# Patient Record
Sex: Female | Born: 1970
Health system: Southern US, Community
[De-identification: ages and names within clinical notes are randomized; demographics above are authoritative.]

## PROBLEM LIST (undated history)

## (undated) ENCOUNTER — Inpatient Hospital Stay (HOSPITAL_COMMUNITY): Payer: Self-pay

## (undated) DIAGNOSIS — B009 Herpesviral infection, unspecified: Secondary | ICD-10-CM

## (undated) DIAGNOSIS — Z8489 Family history of other specified conditions: Secondary | ICD-10-CM

## (undated) DIAGNOSIS — T7840XA Allergy, unspecified, initial encounter: Secondary | ICD-10-CM

## (undated) DIAGNOSIS — N750 Cyst of Bartholin's gland: Secondary | ICD-10-CM

## (undated) DIAGNOSIS — I10 Essential (primary) hypertension: Secondary | ICD-10-CM

## (undated) DIAGNOSIS — IMO0001 Reserved for inherently not codable concepts without codable children: Secondary | ICD-10-CM

## (undated) HISTORY — DX: Herpesviral infection, unspecified: B00.9

## (undated) HISTORY — PX: TUBAL LIGATION: SHX77

## (undated) HISTORY — DX: Cyst of Bartholin's gland: N75.0

## (undated) HISTORY — DX: Allergy, unspecified, initial encounter: T78.40XA

## (undated) HISTORY — PX: WISDOM TOOTH EXTRACTION: SHX21

---

## 1999-04-27 ENCOUNTER — Other Ambulatory Visit: Admission: RE | Admit: 1999-04-27 | Discharge: 1999-04-27 | Payer: Self-pay | Admitting: Obstetrics and Gynecology

## 2000-05-21 ENCOUNTER — Other Ambulatory Visit: Admission: RE | Admit: 2000-05-21 | Discharge: 2000-05-21 | Payer: Self-pay | Admitting: Obstetrics and Gynecology

## 2001-05-06 ENCOUNTER — Other Ambulatory Visit: Admission: RE | Admit: 2001-05-06 | Discharge: 2001-05-06 | Payer: Self-pay | Admitting: Obstetrics and Gynecology

## 2002-05-28 ENCOUNTER — Other Ambulatory Visit: Admission: RE | Admit: 2002-05-28 | Discharge: 2002-05-28 | Payer: Self-pay | Admitting: Obstetrics and Gynecology

## 2003-06-25 HISTORY — PX: AUGMENTATION MAMMAPLASTY: SUR837

## 2003-07-13 ENCOUNTER — Other Ambulatory Visit: Admission: RE | Admit: 2003-07-13 | Discharge: 2003-07-13 | Payer: Self-pay | Admitting: Obstetrics and Gynecology

## 2004-07-25 ENCOUNTER — Other Ambulatory Visit: Admission: RE | Admit: 2004-07-25 | Discharge: 2004-07-25 | Payer: Self-pay | Admitting: Obstetrics and Gynecology

## 2005-07-30 ENCOUNTER — Other Ambulatory Visit: Admission: RE | Admit: 2005-07-30 | Discharge: 2005-07-30 | Payer: Self-pay | Admitting: Gynecology

## 2006-05-26 ENCOUNTER — Encounter: Admission: RE | Admit: 2006-05-26 | Discharge: 2006-05-26 | Payer: Self-pay | Admitting: Gynecology

## 2006-09-04 ENCOUNTER — Other Ambulatory Visit: Admission: RE | Admit: 2006-09-04 | Discharge: 2006-09-04 | Payer: Self-pay | Admitting: Gynecology

## 2007-10-01 ENCOUNTER — Other Ambulatory Visit: Admission: RE | Admit: 2007-10-01 | Discharge: 2007-10-01 | Payer: Self-pay | Admitting: Gynecology

## 2007-11-02 ENCOUNTER — Encounter: Admission: RE | Admit: 2007-11-02 | Discharge: 2007-11-02 | Payer: Self-pay | Admitting: Gynecology

## 2008-08-19 ENCOUNTER — Ambulatory Visit: Payer: Self-pay | Admitting: Gynecology

## 2008-08-25 ENCOUNTER — Ambulatory Visit: Payer: Self-pay | Admitting: Gynecology

## 2008-08-25 ENCOUNTER — Ambulatory Visit (HOSPITAL_COMMUNITY): Admission: RE | Admit: 2008-08-25 | Discharge: 2008-08-25 | Payer: Self-pay | Admitting: Gynecology

## 2008-08-26 ENCOUNTER — Ambulatory Visit: Payer: Self-pay | Admitting: Gynecology

## 2008-09-01 ENCOUNTER — Encounter: Admission: RE | Admit: 2008-09-01 | Discharge: 2008-09-01 | Payer: Self-pay | Admitting: Gynecology

## 2008-10-10 ENCOUNTER — Encounter: Payer: Self-pay | Admitting: Gynecology

## 2008-10-10 ENCOUNTER — Other Ambulatory Visit: Admission: RE | Admit: 2008-10-10 | Discharge: 2008-10-10 | Payer: Self-pay | Admitting: Gynecology

## 2008-10-10 ENCOUNTER — Ambulatory Visit: Payer: Self-pay | Admitting: Gynecology

## 2009-03-03 ENCOUNTER — Ambulatory Visit: Payer: Self-pay | Admitting: Gynecology

## 2009-05-01 ENCOUNTER — Encounter: Admission: RE | Admit: 2009-05-01 | Discharge: 2009-05-01 | Payer: Self-pay | Admitting: Gynecology

## 2009-08-14 ENCOUNTER — Ambulatory Visit: Payer: Self-pay | Admitting: Gynecology

## 2009-08-28 ENCOUNTER — Ambulatory Visit: Payer: Self-pay | Admitting: Gynecology

## 2009-12-18 ENCOUNTER — Other Ambulatory Visit: Admission: RE | Admit: 2009-12-18 | Discharge: 2009-12-18 | Payer: Self-pay | Admitting: Gynecology

## 2009-12-18 ENCOUNTER — Ambulatory Visit: Payer: Self-pay | Admitting: Gynecology

## 2010-01-09 ENCOUNTER — Ambulatory Visit: Payer: Self-pay | Admitting: Gynecology

## 2010-02-09 ENCOUNTER — Ambulatory Visit (HOSPITAL_BASED_OUTPATIENT_CLINIC_OR_DEPARTMENT_OTHER): Admission: RE | Admit: 2010-02-09 | Discharge: 2010-02-09 | Payer: Self-pay | Admitting: Otolaryngology

## 2010-02-11 ENCOUNTER — Ambulatory Visit: Payer: Self-pay | Admitting: Internal Medicine

## 2010-05-09 ENCOUNTER — Ambulatory Visit: Payer: Self-pay | Admitting: Gynecology

## 2010-07-15 ENCOUNTER — Encounter: Payer: Self-pay | Admitting: Gynecology

## 2010-09-13 ENCOUNTER — Ambulatory Visit: Payer: Self-pay | Admitting: Gynecology

## 2010-09-14 ENCOUNTER — Ambulatory Visit (INDEPENDENT_AMBULATORY_CARE_PROVIDER_SITE_OTHER): Payer: 59 | Admitting: Gynecology

## 2010-09-14 DIAGNOSIS — R635 Abnormal weight gain: Secondary | ICD-10-CM

## 2010-09-14 DIAGNOSIS — N979 Female infertility, unspecified: Secondary | ICD-10-CM

## 2010-09-14 DIAGNOSIS — N926 Irregular menstruation, unspecified: Secondary | ICD-10-CM

## 2010-09-26 ENCOUNTER — Other Ambulatory Visit (INDEPENDENT_AMBULATORY_CARE_PROVIDER_SITE_OTHER): Payer: 59

## 2010-09-26 DIAGNOSIS — N970 Female infertility associated with anovulation: Secondary | ICD-10-CM

## 2010-10-05 ENCOUNTER — Other Ambulatory Visit: Payer: Self-pay | Admitting: Gynecology

## 2010-10-05 DIAGNOSIS — N979 Female infertility, unspecified: Secondary | ICD-10-CM

## 2010-10-11 ENCOUNTER — Ambulatory Visit (HOSPITAL_COMMUNITY)
Admission: RE | Admit: 2010-10-11 | Discharge: 2010-10-11 | Payer: 59 | Source: Ambulatory Visit | Attending: Gynecology | Admitting: Gynecology

## 2010-11-02 ENCOUNTER — Ambulatory Visit (INDEPENDENT_AMBULATORY_CARE_PROVIDER_SITE_OTHER): Payer: 59 | Admitting: Gynecology

## 2010-11-02 DIAGNOSIS — R823 Hemoglobinuria: Secondary | ICD-10-CM

## 2010-11-02 DIAGNOSIS — N949 Unspecified condition associated with female genital organs and menstrual cycle: Secondary | ICD-10-CM

## 2010-11-08 ENCOUNTER — Ambulatory Visit (HOSPITAL_COMMUNITY)
Admission: RE | Admit: 2010-11-08 | Discharge: 2010-11-08 | Disposition: A | Discharge status: Home / Self Care | Payer: 59 | Source: Ambulatory Visit | Attending: Gynecology | Admitting: Gynecology

## 2010-11-08 DIAGNOSIS — N979 Female infertility, unspecified: Secondary | ICD-10-CM

## 2011-01-04 ENCOUNTER — Other Ambulatory Visit (INDEPENDENT_AMBULATORY_CARE_PROVIDER_SITE_OTHER): Payer: 59

## 2011-01-04 DIAGNOSIS — N912 Amenorrhea, unspecified: Secondary | ICD-10-CM

## 2011-01-10 ENCOUNTER — Ambulatory Visit (INDEPENDENT_AMBULATORY_CARE_PROVIDER_SITE_OTHER): Payer: 59 | Admitting: Gynecology

## 2011-01-10 ENCOUNTER — Other Ambulatory Visit: Payer: 59

## 2011-01-10 DIAGNOSIS — O9989 Other specified diseases and conditions complicating pregnancy, childbirth and the puerperium: Secondary | ICD-10-CM

## 2011-01-10 DIAGNOSIS — N912 Amenorrhea, unspecified: Secondary | ICD-10-CM

## 2011-01-10 DIAGNOSIS — M545 Low back pain, unspecified: Secondary | ICD-10-CM

## 2011-08-07 ENCOUNTER — Inpatient Hospital Stay (HOSPITAL_COMMUNITY)
Admission: AD | Admit: 2011-08-07 | Discharge: 2011-08-07 | Disposition: A | Discharge status: Home / Self Care | Payer: 59 | Source: Ambulatory Visit | Attending: Obstetrics and Gynecology | Admitting: Obstetrics and Gynecology

## 2011-08-07 ENCOUNTER — Encounter (HOSPITAL_COMMUNITY): Payer: Self-pay

## 2011-08-07 DIAGNOSIS — I10 Essential (primary) hypertension: Secondary | ICD-10-CM | POA: Insufficient documentation

## 2011-08-07 DIAGNOSIS — IMO0002 Reserved for concepts with insufficient information to code with codable children: Secondary | ICD-10-CM | POA: Insufficient documentation

## 2011-08-07 HISTORY — DX: Essential (primary) hypertension: I10

## 2011-08-07 LAB — COMPREHENSIVE METABOLIC PANEL
ALT: 16 U/L (ref 0–35)
AST: 17 U/L (ref 0–37)
CO2: 22 mEq/L (ref 19–32)
Chloride: 98 mEq/L (ref 96–112)
Creatinine, Ser: 0.7 mg/dL (ref 0.50–1.10)
Glucose, Bld: 81 mg/dL (ref 70–99)

## 2011-08-07 LAB — CBC
MCHC: 33.3 g/dL (ref 30.0–36.0)
RDW: 12.8 % (ref 11.5–15.5)

## 2011-08-07 LAB — URIC ACID: Uric Acid, Serum: 6.6 mg/dL (ref 2.4–7.0)

## 2011-08-07 NOTE — Progress Notes (Signed)
Patient states she was seen in the office today and sent to MAU for evaluation of elevated BP. Had a high protein level from a 24 hour urine on Monday. Denies any pain or bleeding and reports good fetal movement. Has been having a slight headache.

## 2011-08-07 NOTE — ED Provider Notes (Signed)
History     Chief Complaint  Patient presents with  . Hypertension   HPI 41 yo G1P0 MBF w/ chronic HTN on med now @ 35 5/7 wk w/ mild superimposed preeclampsia based on 24hr UTP 2/10 of 304 mg sent from office due to BP 152/102. Pt denies any other sx. (+) leg swelling  NST reactive in office OB History    Grav Para Term Preterm Abortions TAB SAB Ect Mult Living   1               No past medical history on file. chronic HTN  No past surgical history on file.  No family history on file.  History  Substance Use Topics  . Smoking status: Not on file  . Smokeless tobacco: Not on file  . Alcohol Use: Not on file    Allergies:  Allergies  Allergen Reactions  . Sulfa Drugs Cross Reactors Hives and Swelling    Prescriptions prior to admission  Medication Sig Dispense Refill  . acetaminophen (TYLENOL) 500 MG tablet Take 500 mg by mouth every 6 (six) hours as needed. For pain.      Marland Kitchen FOLIC ACID PO Take 1 tablet by mouth daily.      . methyldopa (ALDOMET) 250 MG tablet Take 250 mg by mouth 2 (two) times daily.      . Prenatal Vit-Fe Fumarate-FA (PRENATAL MULTIVITAMIN) TABS Take 1 tablet by mouth daily.        ROS neg except leg swelling Physical Exam  Abd gravid nontender. Extremity 1-2 plus edema VE: deferred Blood pressure 133/80, pulse 83, temperature 97.9 F (36.6 C), temperature source Oral, resp. rate 20, last menstrual period 11/02/2010, SpO2 100.00%.  Physical Exam  MAU Course  Procedures  MDM   Assessment and Plan  Chronic HTN w/ mild superimposed preeclampsia here for further eval of BP   P) serial BP. CBC, CMP, uric acid. Cont aldomet. Admit if abnl labs  Jannatul Wojdyla A 08/07/2011, 5:21 PM

## 2011-08-23 ENCOUNTER — Inpatient Hospital Stay (HOSPITAL_COMMUNITY)
Admission: AD | Admit: 2011-08-23 | Discharge: 2011-08-25 | DRG: 765 | Disposition: A | Discharge status: Home / Self Care | Payer: 59 | Source: Ambulatory Visit | Attending: Obstetrics and Gynecology | Admitting: Obstetrics and Gynecology

## 2011-08-23 ENCOUNTER — Encounter (HOSPITAL_COMMUNITY): Payer: Self-pay | Admitting: *Deleted

## 2011-08-23 ENCOUNTER — Encounter (HOSPITAL_COMMUNITY)
Admission: AD | Disposition: A | Discharge status: Home / Self Care | Payer: Self-pay | Source: Ambulatory Visit | Attending: Obstetrics and Gynecology

## 2011-08-23 ENCOUNTER — Inpatient Hospital Stay (HOSPITAL_COMMUNITY): Admission: RE | Admit: 2011-08-23 | Payer: 59 | Source: Ambulatory Visit | Admitting: Obstetrics and Gynecology

## 2011-08-23 ENCOUNTER — Inpatient Hospital Stay (HOSPITAL_COMMUNITY): Payer: 59 | Admitting: Anesthesiology

## 2011-08-23 ENCOUNTER — Encounter (HOSPITAL_COMMUNITY): Payer: Self-pay | Admitting: Anesthesiology

## 2011-08-23 ENCOUNTER — Other Ambulatory Visit: Payer: Self-pay | Admitting: Obstetrics and Gynecology

## 2011-08-23 DIAGNOSIS — IMO0001 Reserved for inherently not codable concepts without codable children: Secondary | ICD-10-CM | POA: Diagnosis present

## 2011-08-23 DIAGNOSIS — O9902 Anemia complicating childbirth: Secondary | ICD-10-CM | POA: Diagnosis present

## 2011-08-23 DIAGNOSIS — O1002 Pre-existing essential hypertension complicating childbirth: Secondary | ICD-10-CM | POA: Diagnosis present

## 2011-08-23 DIAGNOSIS — O09519 Supervision of elderly primigravida, unspecified trimester: Secondary | ICD-10-CM | POA: Diagnosis present

## 2011-08-23 DIAGNOSIS — O4100X0 Oligohydramnios, unspecified trimester, not applicable or unspecified: Principal | ICD-10-CM | POA: Diagnosis present

## 2011-08-23 DIAGNOSIS — D509 Iron deficiency anemia, unspecified: Secondary | ICD-10-CM | POA: Diagnosis present

## 2011-08-23 DIAGNOSIS — Z302 Encounter for sterilization: Secondary | ICD-10-CM

## 2011-08-23 DIAGNOSIS — O321XX0 Maternal care for breech presentation, not applicable or unspecified: Secondary | ICD-10-CM | POA: Diagnosis present

## 2011-08-23 DIAGNOSIS — IMO0002 Reserved for concepts with insufficient information to code with codable children: Secondary | ICD-10-CM | POA: Diagnosis present

## 2011-08-23 HISTORY — DX: Reserved for inherently not codable concepts without codable children: IMO0001

## 2011-08-23 LAB — URIC ACID: Uric Acid, Serum: 6.5 mg/dL (ref 2.4–7.0)

## 2011-08-23 LAB — COMPREHENSIVE METABOLIC PANEL
BUN: 11 mg/dL (ref 6–23)
CO2: 23 mEq/L (ref 19–32)
Calcium: 9.4 mg/dL (ref 8.4–10.5)
Creatinine, Ser: 0.64 mg/dL (ref 0.50–1.10)
GFR calc Af Amer: >90 mL/min (ref >90)
GFR calc non Af Amer: >90 mL/min (ref >90)
Glucose, Bld: 82 mg/dL (ref 70–99)
Total Bilirubin: 0.2 mg/dL — ABNORMAL LOW (ref 0.3–1.2)

## 2011-08-23 LAB — CBC
HCT: 32.9 % — ABNORMAL LOW (ref 36.0–46.0)
Hemoglobin: 11.2 g/dL — ABNORMAL LOW (ref 12.0–15.0)
MCH: 31.4 pg (ref 26.0–34.0)
MCV: 92.2 fL (ref 78.0–100.0)
RBC: 3.57 MIL/uL — ABNORMAL LOW (ref 3.87–5.11)

## 2011-08-23 LAB — AMNISURE RUPTURE OF MEMBRANE (ROM) NOT AT ARMC: Amnisure ROM: POSITIVE

## 2011-08-23 SURGERY — Surgical Case
Anesthesia: Spinal | Laterality: Bilateral

## 2011-08-23 SURGERY — Surgical Case
Anesthesia: Regional

## 2011-08-23 MED ORDER — MORPHINE SULFATE (PF) 0.5 MG/ML IJ SOLN
INTRAMUSCULAR | Status: DC | PRN
  Administered 2011-08-23: .1 mg via INTRATHECAL

## 2011-08-23 MED ORDER — FENTANYL CITRATE 0.05 MG/ML IJ SOLN
INTRAMUSCULAR | Status: DC | PRN
  Administered 2011-08-23: 35 ug via INTRAVENOUS
  Administered 2011-08-23: 15 ug via INTRATHECAL
  Administered 2011-08-23: 50 ug via INTRAVENOUS

## 2011-08-23 MED ORDER — LACTATED RINGERS IV SOLN
INTRAVENOUS | Status: DC | PRN
  Administered 2011-08-23 (×2): via INTRAVENOUS

## 2011-08-23 MED ORDER — CITRIC ACID-SODIUM CITRATE 334-500 MG/5ML PO SOLN
30.0000 mL | Freq: Once | ORAL | Status: DC
  Filled 2011-08-23: qty 15

## 2011-08-23 MED ORDER — CEFAZOLIN SODIUM-DEXTROSE 2-3 GM-% IV SOLR
2.0000 g | INTRAVENOUS | Status: AC
  Administered 2011-08-23: 2 g via INTRAVENOUS
  Filled 2011-08-23: qty 50

## 2011-08-23 MED ORDER — BUPIVACAINE IN DEXTROSE 0.75-8.25 % IT SOLN
INTRATHECAL | Status: DC | PRN
  Administered 2011-08-23: 11.75 mg via INTRATHECAL

## 2011-08-23 MED ORDER — ONDANSETRON HCL 4 MG/2ML IJ SOLN
INTRAMUSCULAR | Status: DC | PRN
  Administered 2011-08-23: 4 mg via INTRAVENOUS

## 2011-08-23 MED ORDER — FAMOTIDINE IN NACL 20-0.9 MG/50ML-% IV SOLN
20.0000 mg | Freq: Once | INTRAVENOUS | Status: AC
  Administered 2011-08-23: 20 mg via INTRAVENOUS
  Filled 2011-08-23: qty 50

## 2011-08-23 MED ORDER — OXYTOCIN 20 UNITS IN LACTATED RINGERS INFUSION - SIMPLE
INTRAVENOUS | Status: DC | PRN
  Administered 2011-08-23 (×2): 20 [IU] via INTRAVENOUS

## 2011-08-23 MED ORDER — BUPIVACAINE HCL (PF) 0.25 % IJ SOLN
INTRAMUSCULAR | Status: DC | PRN
  Administered 2011-08-23: 20 mL

## 2011-08-23 SURGICAL SUPPLY — 44 items
APL SKNCLS STERI-STRIP NONHPOA (GAUZE/BANDAGES/DRESSINGS)
BENZOIN TINCTURE PRP APPL 2/3 (GAUZE/BANDAGES/DRESSINGS) IMPLANT
CLOTH BEACON ORANGE TIMEOUT ST (SAFETY) ×2 IMPLANT
CONTAINER PREFILL 10% NBF 15ML (MISCELLANEOUS) ×2 IMPLANT
DRESSING TELFA 8X3 (GAUZE/BANDAGES/DRESSINGS) ×2 IMPLANT
DRSG COVADERM 4X8 (GAUZE/BANDAGES/DRESSINGS) ×1 IMPLANT
DRSG PAD ABDOMINAL 8X10 ST (GAUZE/BANDAGES/DRESSINGS) ×1 IMPLANT
ELECT REM PT RETURN 9FT ADLT (ELECTROSURGICAL) ×2
ELECTRODE REM PT RTRN 9FT ADLT (ELECTROSURGICAL) ×1 IMPLANT
EXTRACTOR VACUUM KIWI (MISCELLANEOUS) IMPLANT
EXTRACTOR VACUUM M CUP 4 TUBE (SUCTIONS) IMPLANT
GAUZE SPONGE 4X4 12PLY STRL LF (GAUZE/BANDAGES/DRESSINGS) ×4 IMPLANT
GLOVE BIO SURGEON STRL SZ 6.5 (GLOVE) ×2 IMPLANT
GLOVE BIOGEL PI IND STRL 7.0 (GLOVE) ×2 IMPLANT
GLOVE BIOGEL PI INDICATOR 7.0 (GLOVE) ×2
GLOVE SURG SS PI 7.5 STRL IVOR (GLOVE) ×2 IMPLANT
GOWN PREVENTION PLUS LG XLONG (DISPOSABLE) ×6 IMPLANT
KIT ABG SYR 3ML LUER SLIP (SYRINGE) IMPLANT
NDL HYPO 25X1 1.5 SAFETY (NEEDLE) ×1 IMPLANT
NDL HYPO 25X5/8 SAFETYGLIDE (NEEDLE) IMPLANT
NEEDLE HYPO 25X1 1.5 SAFETY (NEEDLE) ×2 IMPLANT
NEEDLE HYPO 25X5/8 SAFETYGLIDE (NEEDLE) ×2 IMPLANT
NS IRRIG 1000ML POUR BTL (IV SOLUTION) ×2 IMPLANT
PACK C SECTION WH (CUSTOM PROCEDURE TRAY) ×2 IMPLANT
PAD ABD 7.5X8 STRL (GAUZE/BANDAGES/DRESSINGS) ×2 IMPLANT
RTRCTR C-SECT PINK 25CM LRG (MISCELLANEOUS) ×1 IMPLANT
SLEEVE SCD COMPRESS KNEE MED (MISCELLANEOUS) ×1 IMPLANT
STAPLER VISISTAT 35W (STAPLE) ×1 IMPLANT
STRIP CLOSURE SKIN 1/2X4 (GAUZE/BANDAGES/DRESSINGS) IMPLANT
SUT CHROMIC GUT AB #0 18 (SUTURE) ×1 IMPLANT
SUT MNCRL 0 VIOLET CTX 36 (SUTURE) ×3 IMPLANT
SUT MON AB 4-0 PS1 27 (SUTURE) IMPLANT
SUT MONOCRYL 0 CTX 36 (SUTURE) ×3
SUT PLAIN 2 0 (SUTURE)
SUT PLAIN ABS 2-0 CT1 27XMFL (SUTURE) IMPLANT
SUT VIC AB 0 CT1 27 (SUTURE) ×4
SUT VIC AB 0 CT1 27XBRD ANBCTR (SUTURE) ×2 IMPLANT
SUT VIC AB 2-0 CT1 27 (SUTURE) ×2
SUT VIC AB 2-0 CT1 TAPERPNT 27 (SUTURE) ×1 IMPLANT
SUT VICRYL 0 TIES 12 18 (SUTURE) IMPLANT
SYR CONTROL 10ML LL (SYRINGE) ×2 IMPLANT
TOWEL OR 17X24 6PK STRL BLUE (TOWEL DISPOSABLE) ×4 IMPLANT
TRAY FOLEY CATH 14FR (SET/KITS/TRAYS/PACK) ×1 IMPLANT
WATER STERILE IRR 1000ML POUR (IV SOLUTION) ×2 IMPLANT

## 2011-08-23 NOTE — Anesthesia Procedure Notes (Signed)
Spinal  Patient location during procedure: OR Start time: 08/23/2011 10:30 PM Staffing Performed by: anesthesiologist  Preanesthetic Checklist Completed: patient identified, site marked, surgical consent, pre-op evaluation, timeout performed, IV checked, risks and benefits discussed and monitors and equipment checked Spinal Block Patient position: sitting Prep: site prepped and draped and DuraPrep Patient monitoring: heart rate, cardiac monitor, continuous pulse ox and blood pressure Approach: midline Location: L3-4 Injection technique: single-shot Needle Needle type: Sprotte  Needle gauge: 24 G Needle length: 9 cm Assessment Sensory level: T4 Additional Notes Clear free flow CSF on first attempt, no paresthesia.  Patient tolerated procedure well.  Jasmine December, MD

## 2011-08-23 NOTE — Brief Op Note (Signed)
08/23/2011  11:50 PM  PATIENT:  Jaclyn Fields  41 y.o. female  PRE-OPERATIVE DIAGNOSIS:  Complete breech,oligohydramnios, desires sterilization, SROM  POST-OPERATIVE DIAGNOSIS: complete breech presentation, oligohydramnios, SROM,, desires sterilization  PROCEDURE:  Procedure(s) (LRB):Primary  CESAREAN SECTION, KERR HYSTEROTOMY, BILATERAL MODIFIED POMEROY TUBAL LIGATION (Bilateral)  SURGEON:  Surgeon(s) and Role:    * Mason Burleigh Cathie Beams, MD - Primary  PHYSICIAN ASSISTANT:   ASSISTANTS: none  FINDINGS: LIVE FEMALE COMPLETE BREECH, NL TUBES AND OVARIES, PLACENTA MANUALLY REMOVED  CORD AROUND HAND AND TRUNK ANESTHESIA:   spinal  EBL:  Total I/O In: 1500 [I.V.:1500] Out: 900 [Urine:200; Blood:700]  BLOOD ADMINISTERED:none  DRAINS: none   LOCAL MEDICATIONS USED:  MARCAINE     SPECIMEN:  Source of Specimen:  portion of right and left tube  DISPOSITION OF SPECIMEN:  PATHOLOGY  COUNTS:  YES  TOURNIQUET:  * No tourniquets in log *  DICTATION: .Other Dictation: Dictation Number [id number]  PLAN OF CARE: Admit to inpatient   PATIENT DISPOSITION:  PACU - hemodynamically stable.   Delay start of Pharmacological VTE agent (>24hrs) due to surgical blood loss or risk of bleeding: no

## 2011-08-23 NOTE — Transfer of Care (Signed)
Immediate Anesthesia Transfer of Care Note  Patient: Jaclyn Fields  Procedure(s) Performed: Procedure(s) (LRB): CESAREAN SECTION WITH BILATERAL TUBAL LIGATION (Bilateral)  Patient Location: PACU  Anesthesia Type: Spinal  Level of Consciousness: awake, alert , oriented and patient cooperative  Airway & Oxygen Therapy: Patient Spontanous Breathing  Post-op Assessment: Report given to PACU RN  Post vital signs: Reviewed  Complications: No apparent anesthesia complications

## 2011-08-23 NOTE — Preoperative (Signed)
Beta Blockers   Reason not to administer Beta Blockers:Not Applicable 

## 2011-08-23 NOTE — Anesthesia Preprocedure Evaluation (Signed)
Anesthesia Evaluation  Patient identified by MRN, date of birth, ID band Patient awake    Reviewed: Allergy & Precautions, H&P , NPO status , Patient's Chart, lab work & pertinent test results, reviewed documented beta blocker date and time   History of Anesthesia Complications (+) PONV  Airway Mallampati: II TM Distance: >3 FB Neck ROM: full    Dental  (+) Teeth Intact   Pulmonary neg pulmonary ROS,  breath sounds clear to auscultation  Pulmonary exam normal       Cardiovascular hypertension (on HCTZ and metoprolol prior to pregnancy, on aldomet since pregnancy), On Medications Rhythm:regular Rate:Normal     Neuro/Psych negative neurological ROS  negative psych ROS   GI/Hepatic negative GI ROS, Neg liver ROS,   Endo/Other  negative endocrine ROS  Renal/GU negative Renal ROS  negative genitourinary   Musculoskeletal   Abdominal   Peds  Hematology negative hematology ROS (+)   Anesthesia Other Findings Full meal around 11 am today  Reproductive/Obstetrics (+) Pregnancy (breech, ruptured)                           Anesthesia Physical Anesthesia Plan  ASA: III and Emergent  Anesthesia Plan: Spinal   Post-op Pain Management:    Induction:   Airway Management Planned:   Additional Equipment:   Intra-op Plan:   Post-operative Plan:   Informed Consent: I have reviewed the patients History and Physical, chart, labs and discussed the procedure including the risks, benefits and alternatives for the proposed anesthesia with the patient or authorized representative who has indicated his/her understanding and acceptance.   Dental Advisory Given  Plan Discussed with: CRNA and Surgeon  Anesthesia Plan Comments:         Anesthesia Quick Evaluation

## 2011-08-23 NOTE — Progress Notes (Signed)
Attempt #1 for IV, site blown, pressure dressing applied.

## 2011-08-24 ENCOUNTER — Encounter (HOSPITAL_COMMUNITY): Payer: Self-pay | Admitting: Obstetrics and Gynecology

## 2011-08-24 ENCOUNTER — Encounter (HOSPITAL_COMMUNITY): Payer: Self-pay | Admitting: Anesthesiology

## 2011-08-24 DIAGNOSIS — IMO0001 Reserved for inherently not codable concepts without codable children: Secondary | ICD-10-CM

## 2011-08-24 HISTORY — DX: Reserved for inherently not codable concepts without codable children: IMO0001

## 2011-08-24 LAB — CBC
MCH: 30.9 pg (ref 26.0–34.0)
MCHC: 33.5 g/dL (ref 30.0–36.0)
MCV: 92.2 fL (ref 78.0–100.0)
Platelets: 240 10*3/uL (ref 150–400)

## 2011-08-24 MED ORDER — KETOROLAC TROMETHAMINE 30 MG/ML IJ SOLN: 30.0000 mg | Freq: Four times a day (QID) | INTRAMUSCULAR | Status: AC | PRN

## 2011-08-24 MED ORDER — METHYLDOPA 250 MG PO TABS
250.0000 mg | ORAL_TABLET | Freq: Two times a day (BID) | ORAL | Status: DC
  Administered 2011-08-24 – 2011-08-25 (×3): 250 mg via ORAL
  Filled 2011-08-24 (×4): qty 1

## 2011-08-24 MED ORDER — ZOLPIDEM TARTRATE 5 MG PO TABS: 5.0000 mg | ORAL_TABLET | Freq: Every evening | ORAL | Status: DC | PRN

## 2011-08-24 MED ORDER — SODIUM CHLORIDE 0.9 % IJ SOLN: 3.0000 mL | INTRAMUSCULAR | Status: DC | PRN

## 2011-08-24 MED ORDER — MEPERIDINE HCL 25 MG/ML IJ SOLN: 6.2500 mg | INTRAMUSCULAR | Status: DC | PRN

## 2011-08-24 MED ORDER — SENNOSIDES-DOCUSATE SODIUM 8.6-50 MG PO TABS
2.0000 | ORAL_TABLET | Freq: Every day | ORAL | Status: DC
  Administered 2011-08-24: 2 via ORAL

## 2011-08-24 MED ORDER — OXYTOCIN 20 UNITS IN LACTATED RINGERS INFUSION - SIMPLE: 125.0000 mL/h | INTRAVENOUS | Status: AC

## 2011-08-24 MED ORDER — KETOROLAC TROMETHAMINE 60 MG/2ML IM SOLN
60.0000 mg | Freq: Once | INTRAMUSCULAR | Status: AC | PRN
  Administered 2011-08-24: 60 mg via INTRAMUSCULAR

## 2011-08-24 MED ORDER — KETOROLAC TROMETHAMINE 60 MG/2ML IM SOLN
INTRAMUSCULAR | Status: AC
  Administered 2011-08-24: 60 mg via INTRAMUSCULAR
  Filled 2011-08-24: qty 2

## 2011-08-24 MED ORDER — DIBUCAINE 1 % RE OINT: 1.0000 "application " | TOPICAL_OINTMENT | RECTAL | Status: DC | PRN

## 2011-08-24 MED ORDER — DIPHENHYDRAMINE HCL 25 MG PO CAPS: 25.0000 mg | ORAL_CAPSULE | Freq: Four times a day (QID) | ORAL | Status: DC | PRN

## 2011-08-24 MED ORDER — MENTHOL 3 MG MT LOZG: 1.0000 | LOZENGE | OROMUCOSAL | Status: DC | PRN

## 2011-08-24 MED ORDER — KETOROLAC TROMETHAMINE 30 MG/ML IJ SOLN
30.0000 mg | Freq: Four times a day (QID) | INTRAMUSCULAR | Status: AC | PRN
  Filled 2011-08-24: qty 1

## 2011-08-24 MED ORDER — SCOPOLAMINE 1 MG/3DAYS TD PT72
1.0000 | MEDICATED_PATCH | Freq: Once | TRANSDERMAL | Status: DC
  Administered 2011-08-24: 1.5 mg via TRANSDERMAL

## 2011-08-24 MED ORDER — FENTANYL CITRATE 0.05 MG/ML IJ SOLN: 25.0000 ug | INTRAMUSCULAR | Status: DC | PRN

## 2011-08-24 MED ORDER — IBUPROFEN 600 MG PO TABS
600.0000 mg | ORAL_TABLET | Freq: Four times a day (QID) | ORAL | Status: DC
  Administered 2011-08-24 – 2011-08-25 (×6): 600 mg via ORAL
  Filled 2011-08-24 (×2): qty 1

## 2011-08-24 MED ORDER — IBUPROFEN 600 MG PO TABS
600.0000 mg | ORAL_TABLET | Freq: Four times a day (QID) | ORAL | Status: DC | PRN
  Filled 2011-08-24: qty 2
  Filled 2011-08-24 (×2): qty 1

## 2011-08-24 MED ORDER — ONDANSETRON HCL 4 MG/2ML IJ SOLN: 4.0000 mg | INTRAMUSCULAR | Status: DC | PRN

## 2011-08-24 MED ORDER — SODIUM CHLORIDE 0.9 % IJ SOLN: 3.0000 mL | Freq: Two times a day (BID) | INTRAMUSCULAR | Status: DC

## 2011-08-24 MED ORDER — SODIUM CHLORIDE 0.9 % IV SOLN
1.0000 ug/kg/h | INTRAVENOUS | Status: DC | PRN
  Filled 2011-08-24: qty 2.5

## 2011-08-24 MED ORDER — DIPHENHYDRAMINE HCL 50 MG/ML IJ SOLN: 12.5000 mg | INTRAMUSCULAR | Status: DC | PRN

## 2011-08-24 MED ORDER — OXYCODONE-ACETAMINOPHEN 5-325 MG PO TABS
1.0000 | ORAL_TABLET | ORAL | Status: DC | PRN
  Administered 2011-08-25: 1 via ORAL
  Filled 2011-08-24: qty 1

## 2011-08-24 MED ORDER — METHYLERGONOVINE MALEATE 0.2 MG/ML IJ SOLN: 0.2000 mg | INTRAMUSCULAR | Status: DC | PRN

## 2011-08-24 MED ORDER — LANOLIN HYDROUS EX OINT: 1.0000 "application " | TOPICAL_OINTMENT | CUTANEOUS | Status: DC | PRN

## 2011-08-24 MED ORDER — NALBUPHINE HCL 10 MG/ML IJ SOLN
5.0000 mg | INTRAMUSCULAR | Status: DC | PRN
  Filled 2011-08-24: qty 1

## 2011-08-24 MED ORDER — SODIUM CHLORIDE 0.9 % IV SOLN: 250.0000 mL | INTRAVENOUS | Status: DC

## 2011-08-24 MED ORDER — METHYLERGONOVINE MALEATE 0.2 MG PO TABS: 0.2000 mg | ORAL_TABLET | ORAL | Status: DC | PRN

## 2011-08-24 MED ORDER — CEFAZOLIN SODIUM 1-5 GM-% IV SOLN
1.0000 g | Freq: Three times a day (TID) | INTRAVENOUS | Status: AC
  Administered 2011-08-24 (×2): 1 g via INTRAVENOUS
  Filled 2011-08-24 (×2): qty 50

## 2011-08-24 MED ORDER — SCOPOLAMINE 1 MG/3DAYS TD PT72
MEDICATED_PATCH | TRANSDERMAL | Status: AC
  Administered 2011-08-24: 1.5 mg via TRANSDERMAL
  Filled 2011-08-24: qty 1

## 2011-08-24 MED ORDER — FLEET ENEMA 7-19 GM/118ML RE ENEM: 1.0000 | ENEMA | Freq: Every day | RECTAL | Status: DC | PRN

## 2011-08-24 MED ORDER — DIPHENHYDRAMINE HCL 25 MG PO CAPS
25.0000 mg | ORAL_CAPSULE | ORAL | Status: DC | PRN
Start: 2011-08-24 — End: 2011-08-25

## 2011-08-24 MED ORDER — BISACODYL 10 MG RE SUPP: 10.0000 mg | Freq: Every day | RECTAL | Status: DC | PRN

## 2011-08-24 MED ORDER — SIMETHICONE 80 MG PO CHEW
80.0000 mg | CHEWABLE_TABLET | Freq: Three times a day (TID) | ORAL | Status: DC
  Administered 2011-08-24 – 2011-08-25 (×4): 80 mg via ORAL

## 2011-08-24 MED ORDER — ONDANSETRON HCL 4 MG/2ML IJ SOLN: 4.0000 mg | Freq: Three times a day (TID) | INTRAMUSCULAR | Status: DC | PRN

## 2011-08-24 MED ORDER — METOCLOPRAMIDE HCL 5 MG/ML IJ SOLN: 10.0000 mg | Freq: Three times a day (TID) | INTRAMUSCULAR | Status: DC | PRN

## 2011-08-24 MED ORDER — SIMETHICONE 80 MG PO CHEW
80.0000 mg | CHEWABLE_TABLET | ORAL | Status: DC | PRN
Start: 2011-08-24 — End: 2011-08-25

## 2011-08-24 MED ORDER — PRENATAL MULTIVITAMIN CH
1.0000 | ORAL_TABLET | Freq: Every day | ORAL | Status: DC
  Administered 2011-08-24 – 2011-08-25 (×2): 1 via ORAL
  Filled 2011-08-24 (×2): qty 1

## 2011-08-24 MED ORDER — TETANUS-DIPHTH-ACELL PERTUSSIS 5-2.5-18.5 LF-MCG/0.5 IM SUSP
0.5000 mL | Freq: Once | INTRAMUSCULAR | Status: AC
  Administered 2011-08-25: 0.5 mL via INTRAMUSCULAR

## 2011-08-24 MED ORDER — NALOXONE HCL 0.4 MG/ML IJ SOLN: 0.4000 mg | INTRAMUSCULAR | Status: DC | PRN

## 2011-08-24 MED ORDER — DIPHENHYDRAMINE HCL 50 MG/ML IJ SOLN: 25.0000 mg | INTRAMUSCULAR | Status: DC | PRN

## 2011-08-24 MED ORDER — WITCH HAZEL-GLYCERIN EX PADS: 1.0000 "application " | MEDICATED_PAD | CUTANEOUS | Status: DC | PRN

## 2011-08-24 MED ORDER — ONDANSETRON HCL 4 MG PO TABS: 4.0000 mg | ORAL_TABLET | ORAL | Status: DC | PRN

## 2011-08-24 NOTE — Op Note (Signed)
NAMELOUINE, TENPENNY               ACCOUNT NO.:  [id number]  MEDICAL RECORD NO.:  [id number]  LOCATION:  PERIO                         FACILITY:  [facility]  PHYSICIAN:  Maxie Better, M.D.DATE OF BIRTH:  [birth year]  DATE OF PROCEDURE:  08/23/2011 DATE OF DISCHARGE:                              OPERATIVE REPORT   PREOPERATIVE DIAGNOSES: 1. Term gestation. 2. Breech presentation. 3. Oligohydramnios. 4. Spontaneous rupture of membranes. 5. Chronic hypertension. 6. Desires sterilization.  PROCEDURES: 1. Primary cesarean section, Kerr hysterotomy. 2. Bilateral Modified Pomeroy tubal ligation.  POSTOPERATIVE DIAGNOSES: 1. Desires sterilization. 2. Chronic hypertension. 3. Spontaneous rupture of membranes. 4. Oligohydramnios. 5. Complete Breech presentation.  ANESTHESIA:  Spinal.  SURGEON:  Maxie Better, M.D.  ASSISTANT:  None.  INDICATION:  This is a 41 year old gravida 2, para 0-0-1-2, married female with chronic hypertension who, on ultrasound at 38 weeks, was found to have a complete breech presentation  and an amniotic fluid index of 4.0 without any evidence of rupture of membranes.  The patient's prenatal course has been unremarkable.  She had an amniocentesis due to her advanced maternal age.  Blood pressures have been well controlled on Aldomet.  The patient presented to [facility] for primary cesarean section.  On arrival, had spontaneous rupture of membranes, clear fluid, confirmed by AmniSure.  The patient desires permanent sterilization.  Consent was signed.  The patient was transferred to the operating room.  DESCRIPTION OF PROCEDURE:  Under adequate spinal anesthesia, the patient was placed in the supine position with a left lateral tilt.  She was sterilely prepped and draped in usual fashion.  Indwelling Foley catheter was sterilely placed.  A 0.25% Marcaine was injected along the planned Pfannenstiel skin incision site.  Pfannenstiel skin  incision was then made, carried down to the rectus fascia.  Rectus fascia was opened transversely.  Rectus fascia was then bluntly and sharply dissected off the rectus muscle in superior and inferior fashion.  The rectus muscle was split in the midline.  The parietal peritoneum was entered sharply and extended.  The vesicouterine peritoneum was opened transversely. The bladder was displaced with blunt dissection and a retractor.  A curvilinear low transverse uterine incision was then made and extended with bandage scissors.  Artificial rupture of membranes occurred clear fluid.  The Alexis self-retaining retractor was placed.  Subsequent delivery was a live female from complete breech position with loops of cord draped between legs was accomplished using the usual breech maneuvers.  The cord was around the trunk and left arm.  Baby was bulb suctioned on the abdomen.  Cord was clamped, cut.  The baby was transferred to the awaiting pediatrician.  Subsequently, assigned Apgars of 8 and 9 at 1 and 5 minutes.  The placenta was manually removed. Uterine cavity was cleaned of debris.  Uterine incision had no extension.  It was closed in 2 layers, the first layer was 0 Monocryl running locked stitch, second layer was imbricating using 0 Monocryl suture.  Bleeding in the midline of the incision resulted in a figure-of- eight suture being placed.  Good hemostasis noted.  Attention was then turned to the adnexa.  Both fallopian tubes were normal.  Both ovaries  were normal.  The midportion of both fallopian tubes were grasped with a Babcock.  The underlying mesosalpinx was opened.  Proximally and distally, 0 chromic sutures were tied x2 proximally and distally for both sides and intervening segment of both fallopian tubes was removed and sent to Pathology.  The abdomen was copiously irrigated and suctioned of debris.  The self-retaining retractor was removed.  The parietal peritoneum was closed  using 2-0 Vicryl.  The rectus fascia was closed with 0 Vicryl x2.  The subcutaneous area was irrigated.  Small bleeders cauterized.  Interrupted 2-0 plain sutures placed and the skin approximated using Ethicon staples.  SPECIMENS:  Portion of right and left fallopian tubes sent to Pathology. Placenta was not sent.  ESTIMATED BLOOD LOSS:  700 mL.  INTRAOPERATIVE FLUID:  1500 mL.  URINE OUTPUT:  200 mL.  COUNTS:  Sponge and instrument counts x2 was correct.  COMPLICATIONS:  None.  Weight of the baby was 6 pounds 10 ounces.  The patient was transferred to recovery room in stable condition.     Maxie Better, M.D.     [place]/MEDQ  D:  08/24/2011  T:  08/24/2011  Job:  [id number]

## 2011-08-24 NOTE — Anesthesia Postprocedure Evaluation (Signed)
Anesthesia Post Note  Patient: Jaclyn Fields  Procedure(s) Performed: Procedure(s) (LRB): CESAREAN SECTION WITH BILATERAL TUBAL LIGATION (Bilateral)  Anesthesia type: Spinal  Patient location: PACU  Post pain: Pain level controlled  Post assessment: Post-op Vital signs reviewed  Last Vitals:  Filed Vitals:   08/24/11 0230  BP: 142/90  Pulse: 78  Temp: 36.9 C  Resp: 18    Post vital signs: Reviewed  Level of consciousness: awake  Complications: No apparent anesthesia complications

## 2011-08-24 NOTE — Progress Notes (Signed)
Subjective: POD# 1 Information for the patient's newborn:  Grissom, Girl Kameka [[id number]]  female    Reports feeling well, desires early D/C Feeding: bottle Patient reports tolerating PO.  Breast symptoms: none Pain controlled with Motrin and Percocet Denies HA/SOB/C/P/N/V/dizziness. Flatus absent. She reports vaginal bleeding as normal, without clots.  She has ambulated x 1, foley in place, clear yellow urine.     Objective:   VS:  Filed Vitals:   08/24/11 0635 08/24/11 0710 08/24/11 0815 08/24/11 1002  BP:   133/81 126/80  Pulse:   74 72  Temp:      TempSrc:      Resp:  18 20 20   Height: 5\' 4"  (1.626 m)     Weight: 93.895 kg (207 lb)     SpO2:  98% 99% 100%     Intake/Output Summary (Last 24 hours) at 08/24/11 1015 Last data filed at 08/24/11 0640  Gross per 24 hour  Intake   2940 ml  Output   1400 ml  Net   1540 ml        Basename 08/24/11 0600 08/23/11 2013  WBC 15.2* 13.9*  HGB 8.7* 11.2*  HCT 26.0* 32.9*  PLT 240 307     Blood type: --/--/O POS (03/01 2013)  Rubella:   immune    Physical Exam:  General: alert, cooperative and no distress CV: Regular rate and rhythm Resp: clear Abdomen: soft, nontender, decreased bowel sounds Incision: clean, dry, intact and dressing to LST Uterine Fundus: firm, below umbilicus, nontender Lochia: moderate Ext: edema +2 pedal and Homans sign is negative, no sign of DVT      Assessment/Plan: 41 y.o.   POD# 1.  s/p Cesarean Delivery.  Indications: breech, SROM                Principal Problem:  *PP care - s/p 1C/S (breech, SROM) Active Problems:  Benign essential hypertension complicating pregnancy, childbirth, and the puerperium  Stable BP on meds, no evidence PEC  Maternal iron deficiency anemia  Asymptomatic, will start oral Fe at discharge  Doing well, stable post-op.               D/C foley  Ambulate, shower and d/c dressing this PM Routine post-op care  Gearldean Lomanto 08/24/2011, 10:15 AM

## 2011-08-24 NOTE — Anesthesia Postprocedure Evaluation (Signed)
  Anesthesia Post-op Note  Patient: Jaclyn Fields  Procedure(s) Performed: Procedure(s) (LRB): CESAREAN SECTION (N/A)  Patient Location: PACU and Nursing Unit  Anesthesia Type: Spinal  Level of Consciousness: awake, alert  and oriented  Airway and Oxygen Therapy: Patient Spontanous Breathing and Patient connected to nasal cannula oxygen  Post-op Pain: none  Post-op Assessment: Post-op Vital signs reviewed and Patient's Cardiovascular Status Stable  Post-op Vital Signs: Reviewed and stable  Complications: No apparent anesthesia complications

## 2011-08-25 MED ORDER — FERRALET 90 90-1 MG PO TABS: 1.0000 | ORAL_TABLET | Freq: Every day | ORAL | Status: DC

## 2011-08-25 MED ORDER — DOCUSATE SODIUM 100 MG PO CAPS: 100.0000 mg | ORAL_CAPSULE | Freq: Every day | ORAL | Status: AC

## 2011-08-25 MED ORDER — IBUPROFEN 800 MG PO TABS: 800.0000 mg | ORAL_TABLET | Freq: Three times a day (TID) | ORAL | Status: AC | PRN

## 2011-08-25 MED ORDER — OXYCODONE-ACETAMINOPHEN 5-325 MG PO TABS: 1.0000 | ORAL_TABLET | ORAL | Status: AC | PRN

## 2011-08-25 NOTE — Progress Notes (Signed)
Subjective: POD# 2 Information for the patient's newborn:  Jaclyn Fields, Girl Jaclyn Fields [[id number]]  female    Reports feeling well, up ad lib in room.  Feeding: bottle Patient reports tolerating PO.  Breast symptoms:none Pain controlled with Motrin Denies HA/SOB/C/P/N/V/dizziness. Flatus present. She reports vaginal bleeding as normal, without clots.  She is ambulating, urinating without difficult.     Objective:   VS:  Filed Vitals:   08/24/11 2000 08/24/11 2212 08/25/11 0005 08/25/11 0615  BP: 124/75 134/83  125/86  Pulse: 86 94  85  Temp: 99.1 F (37.3 C) 98.6 F (37 C)  98.4 F (36.9 C)  TempSrc:  Oral  Oral  Resp: 20 20  20   Height:      Weight:      SpO2: 97%  98%      Intake/Output Summary (Last 24 hours) at 08/25/11 0926 Last data filed at 08/24/11 1830  Gross per 24 hour  Intake      0 ml  Output    600 ml  Net   -600 ml        Basename 08/24/11 0600 08/23/11 2013  WBC 15.2* 13.9*  HGB 8.7* 11.2*  HCT 26.0* 32.9*  PLT 240 307     Blood type: --/--/O POS (03/01 2013)  Rubella:   immune     Physical Exam:  General: alert, cooperative and no distress CV: Regular rate and rhythm Resp: clear Abdomen: soft, nontender, normal bowel sounds Incision: clean, dry, intact and staples to LST incision Uterine Fundus: firm, below umbilicus, nontender Lochia: minimal Ext: edema +2 pedal and Homans sign is negative, no sign of DVT      Assessment/Plan: 41 y.o.   POD# 2.  s/p Cesarean Delivery.  Indications: breech                Principal Problem:  *PP care - s/p 1C/S 3/1 (breech, SROM) Active Problems:  Benign essential hypertension complicating pregnancy, childbirth, and the puerperium  Stable BP on Aldomet - will continue  Maternal iron deficiency anemia  Asymptomatic, will start oral Fe at discharge Doing well, stable.    Routine post-op care D/C home w/ instructions Staples for removal in office in 2 days.  Jaclyn Fields 08/25/2011, 9:26 AM

## 2011-08-25 NOTE — Discharge Summary (Signed)
POSTOPERATIVE DISCHARGE SUMMARY:  Patient ID: Jaclyn Fields MRN: [id number] DOB/AGE: 41-Aug-1972 41 y.o.  Admit date: 08/23/2011 Discharge date:  08/25/2011  Admission Diagnoses: 1. Term gestation.  2. Breech presentation.  3. Oligohydramnios.  4. Spontaneous rupture of membranes.  5. Chronic hypertension.  6. Desires sterilization.   Discharge Diagnoses:  1. Term Pregnancy-delivered 2. Post operative day 2, stable 3. Primary cesarean section and bilateral salpingectomy  Prenatal history: G2P1011   EDC : 09/06/2011, by Other Basis  Prenatal care at [facility] since [redacted] weeks gestation  Prenatal course complicated by: chronic hypertension without symptoms of preeclampsia, breech presentation, oligohydramnios, elderly primigravida.  Prenatal Labs: ABO, Rh:   O positive Antibody:  negative Rubella:   immune RPR: NON REACTIVE (03/01 2013)  HBsAg:   non reactive HIV:   negative GBS:   positive 1 hr Glucola : 135   Medical / Surgical History :  Past medical history:  Past Medical History  Diagnosis Date  . Hypertension   . PP care - s/p 1C/S (breech, SROM) 08/24/2011  . Maternal iron deficiency anemia 08/24/2011    Past surgical history:  Past Surgical History  Procedure Date  . Wisdom tooth extraction     Family History:  Family History  Problem Relation Age of Onset  . Hypertension Mother   . Hypertension Father   . Diabetes Father     Social History:  reports that she has never smoked. She does not have any smokeless tobacco history on file. She reports that she does not drink alcohol or use illicit drugs.   Allergies: Sulfa drugs cross reactors    Current Medications at time of admission:  Aldomet, Valtrex, prenatal vitamins, Benadryl, Zyrtec   Admit labs:  WBC 13.9 (H)  HGB 11.2 (L)  HCT 32.9 (L)  Platelets 307   Uric Acid, Serum 6.5  AST 13  ALT 11     Intrapartum Course: n/a  Procedures: Cesarean section delivery of  female newborn by Dr Cherly Hensen  See operative report for further details  Postoperative / postpartum course: uneventful. Mild acute blood loss anemia noted postpartum, patient was asymptomatic and she was started on oral iron therapy.  Physical Exam:  VSS: Blood pressure 125/86, pulse 85, temperature 98.4 F (36.9 C), temperature source Oral, resp. rate 20, height 5\' 4"  (1.626 m), weight 93.895 kg (207 lb), last menstrual period 11/02/2010, SpO2 98.00%, unknown if currently breastfeeding.   LABS:  Basename 08/24/11 0600 08/23/11 2013  WBC 15.2* 13.9*  HGB 8.7* 11.2*  HCT 26.0* 32.9*  PLT 240 307    I&O: I/O last 3 completed shifts: In: 2940 [P.O.:240; I.V.:2700] Out: 2000 [Urine:1300; Blood:700]      Incision:  approximated with staples / no erythema / no ecchymosis / no drainage Staples: intact for removal in office in 2 days  Discharge Instructions:  Discharged Condition: good Activity: pelvic rest and weight lifting restrictions x  2 weeks Diet: routine Medications:  Medication List  As of 08/25/2011  9:34 AM   STOP taking these medications         acetaminophen 500 MG tablet      FOLIC ACID PO         TAKE these medications         docusate sodium 100 MG capsule   Commonly known as: COLACE   Take 1 capsule (100 mg total) by mouth daily.      Ferralet 90 90-1 MG Tabs   Take 1 tablet  by mouth daily. Take on empty stomach between meals with cup of orange juice or Vit. C 250 mg.  Do not take with dairy products or calcium.      ibuprofen 800 MG tablet   Commonly known as: ADVIL,MOTRIN   Take 1 tablet (800 mg total) by mouth every 8 (eight) hours as needed for pain.      methyldopa 250 MG tablet   Commonly known as: ALDOMET   Take 250 mg by mouth 2 (two) times daily.      oxyCODONE-acetaminophen 5-325 MG per tablet   Commonly known as: PERCOCET   Take 1-2 tablets by mouth every 3 (three) hours as needed (moderate - severe pain).      prenatal multivitamin Tabs    Take 1 tablet by mouth daily.           Condition: stable Postpartum Instructions: refer to practice specific booklet Discharge to: home Disposition: 01-Home or Self Care Follow up :  Follow-up Information    Follow up with COUSINS,SHERONETTE A, MD in 6 weeks.   Contact information:   [address] [place] [address] [phone]       Follow up with [facility]. Schedule an appointment as soon as possible for a visit in 2 days. (for staple removal)           Signed: Dantrell Schertzer 08/25/2011, 9:34 AM

## 2011-09-05 ENCOUNTER — Observation Stay (HOSPITAL_COMMUNITY)
Admission: AD | Admit: 2011-09-05 | Discharge: 2011-09-05 | DRG: 769 | Disposition: A | Discharge status: Home / Self Care | Payer: 59 | Attending: Obstetrics & Gynecology | Admitting: Obstetrics & Gynecology

## 2011-09-05 ENCOUNTER — Inpatient Hospital Stay (HOSPITAL_COMMUNITY): Payer: 59

## 2011-09-05 ENCOUNTER — Encounter (HOSPITAL_COMMUNITY)
Admission: AD | Disposition: A | Discharge status: Home / Self Care | Payer: Self-pay | Source: Home / Self Care | Attending: Obstetrics & Gynecology

## 2011-09-05 ENCOUNTER — Ambulatory Visit: Admit: 2011-09-05 | Payer: Self-pay | Admitting: Obstetrics & Gynecology

## 2011-09-05 ENCOUNTER — Encounter (HOSPITAL_COMMUNITY): Payer: Self-pay | Admitting: *Deleted

## 2011-09-05 ENCOUNTER — Encounter (HOSPITAL_COMMUNITY): Payer: Self-pay | Admitting: Anesthesiology

## 2011-09-05 ENCOUNTER — Inpatient Hospital Stay (HOSPITAL_COMMUNITY): Payer: 59 | Admitting: Anesthesiology

## 2011-09-05 DIAGNOSIS — IMO0001 Reserved for inherently not codable concepts without codable children: Secondary | ICD-10-CM | POA: Diagnosis present

## 2011-09-05 DIAGNOSIS — N939 Abnormal uterine and vaginal bleeding, unspecified: Secondary | ICD-10-CM

## 2011-09-05 HISTORY — PX: DILATION AND EVACUATION: SHX1459

## 2011-09-05 LAB — CBC
HCT: 21.3 % — ABNORMAL LOW (ref 36.0–46.0)
Hemoglobin: 8.6 g/dL — ABNORMAL LOW (ref 12.0–15.0)
Hemoglobin: 7.1 g/dL — ABNORMAL LOW (ref 12.0–15.0)
MCH: 30.7 pg (ref 26.0–34.0)
MCHC: 33 g/dL (ref 30.0–36.0)
MCV: 93.2 fL (ref 78.0–100.0)
Platelets: 379 10*3/uL (ref 150–400)
WBC: 24.5 10*3/uL — ABNORMAL HIGH (ref 4.0–10.5)

## 2011-09-05 LAB — PREPARE RBC (CROSSMATCH)

## 2011-09-05 SURGERY — DILATION AND EVACUATION, UTERUS
Anesthesia: General | Site: Vagina | Laterality: Bilateral | Wound class: Clean Contaminated

## 2011-09-05 MED ORDER — ONDANSETRON HCL 4 MG/2ML IJ SOLN
INTRAMUSCULAR | Status: DC | PRN
  Administered 2011-09-05: 4 mg via INTRAVENOUS

## 2011-09-05 MED ORDER — SUCCINYLCHOLINE CHLORIDE 20 MG/ML IJ SOLN
INTRAMUSCULAR | Status: DC | PRN
  Administered 2011-09-05: 140 mg via INTRAVENOUS

## 2011-09-05 MED ORDER — MORPHINE SULFATE 4 MG/ML IJ SOLN: 0.0500 mg/kg | INTRAMUSCULAR | Status: DC | PRN

## 2011-09-05 MED ORDER — LIDOCAINE HCL (CARDIAC) 20 MG/ML IV SOLN
INTRAVENOUS | Status: DC | PRN
  Administered 2011-09-05: 50 mg via INTRAVENOUS

## 2011-09-05 MED ORDER — KETOROLAC TROMETHAMINE 30 MG/ML IJ SOLN
30.0000 mg | Freq: Once | INTRAMUSCULAR | Status: AC
  Administered 2011-09-05: 30 mg via INTRAMUSCULAR
  Filled 2011-09-05: qty 1

## 2011-09-05 MED ORDER — FENTANYL CITRATE 0.05 MG/ML IJ SOLN
INTRAMUSCULAR | Status: DC | PRN
  Administered 2011-09-05: 100 ug via INTRAVENOUS

## 2011-09-05 MED ORDER — HYDROMORPHONE HCL PF 1 MG/ML IJ SOLN
INTRAMUSCULAR | Status: AC
  Administered 2011-09-05: 0.5 mg via INTRAVENOUS
  Filled 2011-09-05: qty 1

## 2011-09-05 MED ORDER — SODIUM CHLORIDE 0.9 % IV SOLN
INTRAVENOUS | Status: DC
  Administered 2011-09-05: 15:00:00 via INTRAVENOUS

## 2011-09-05 MED ORDER — METHYLDOPA 250 MG PO TABS
250.0000 mg | ORAL_TABLET | ORAL | Status: AC
  Administered 2011-09-05: 250 mg via ORAL
  Filled 2011-09-05: qty 1

## 2011-09-05 MED ORDER — MIDAZOLAM HCL 5 MG/5ML IJ SOLN
INTRAMUSCULAR | Status: DC | PRN
  Administered 2011-09-05: 2 mg via INTRAVENOUS

## 2011-09-05 MED ORDER — CEFAZOLIN SODIUM-DEXTROSE 2-3 GM-% IV SOLR
2.0000 g | INTRAVENOUS | Status: AC
  Administered 2011-09-05: 2 g via INTRAVENOUS
  Filled 2011-09-05: qty 50

## 2011-09-05 MED ORDER — AMOXICILLIN-POT CLAVULANATE 875-125 MG PO TABS
1.0000 | ORAL_TABLET | Freq: Two times a day (BID) | ORAL | Status: DC
  Filled 2011-09-05: qty 1

## 2011-09-05 MED ORDER — MISOPROSTOL 200 MCG PO TABS
800.0000 ug | ORAL_TABLET | Freq: Three times a day (TID) | ORAL | Status: DC
  Administered 2011-09-05: 800 ug via ORAL

## 2011-09-05 MED ORDER — OXYCODONE-ACETAMINOPHEN 5-325 MG PO TABS
2.0000 | ORAL_TABLET | Freq: Once | ORAL | Status: AC
  Administered 2011-09-05: 2 via ORAL
  Filled 2011-09-05: qty 2

## 2011-09-05 MED ORDER — HYDROMORPHONE HCL PF 1 MG/ML IJ SOLN
0.2500 mg | INTRAMUSCULAR | Status: DC | PRN
  Administered 2011-09-05 (×2): 0.5 mg via INTRAVENOUS

## 2011-09-05 MED ORDER — CHLOROPROCAINE HCL 1 % IJ SOLN
INTRAMUSCULAR | Status: DC | PRN
  Administered 2011-09-05: 10 mL

## 2011-09-05 MED ORDER — DEXAMETHASONE SODIUM PHOSPHATE 10 MG/ML IJ SOLN
INTRAMUSCULAR | Status: DC | PRN
  Administered 2011-09-05: 10 mg via INTRAVENOUS

## 2011-09-05 MED ORDER — MISOPROSTOL 200 MCG PO TABS
ORAL_TABLET | ORAL | Status: AC
  Administered 2011-09-05: 800 ug via ORAL
  Filled 2011-09-05: qty 4

## 2011-09-05 MED ORDER — LACTATED RINGERS IV SOLN
INTRAVENOUS | Status: DC
  Administered 2011-09-05 (×2): via INTRAVENOUS

## 2011-09-05 MED ORDER — OXYTOCIN 20 UNITS IN LACTATED RINGERS INFUSION - SIMPLE
INTRAVENOUS | Status: DC | PRN
  Administered 2011-09-05 (×2): 20 [IU] via INTRAVENOUS

## 2011-09-05 MED ORDER — PROPOFOL 10 MG/ML IV EMUL
INTRAVENOUS | Status: DC | PRN
  Administered 2011-09-05: 200 mg via INTRAVENOUS

## 2011-09-05 MED ORDER — MISOPROSTOL 200 MCG PO TABS: 200.0000 ug | ORAL_TABLET | Freq: Three times a day (TID) | ORAL | Status: AC

## 2011-09-05 MED ORDER — OXYCODONE-ACETAMINOPHEN 5-325 MG PO TABS: 1.0000 | ORAL_TABLET | Freq: Four times a day (QID) | ORAL | Status: AC | PRN

## 2011-09-05 MED ORDER — AMOXICILLIN-POT CLAVULANATE 875-125 MG PO TABS: 1.0000 | ORAL_TABLET | Freq: Two times a day (BID) | ORAL | Status: AC

## 2011-09-05 MED ORDER — ONDANSETRON HCL 4 MG/2ML IJ SOLN: 4.0000 mg | Freq: Once | INTRAMUSCULAR | Status: DC | PRN

## 2011-09-05 MED ORDER — MISOPROSTOL 200 MCG PO TABS: 800.0000 ug | ORAL_TABLET | Freq: Once | ORAL | Status: DC

## 2011-09-05 SURGICAL SUPPLY — 23 items
BLADE SURG 11 STRL SS (BLADE) ×1 IMPLANT
CATH ROBINSON RED A/P 16FR (CATHETERS) ×2 IMPLANT
CLOTH BEACON ORANGE TIMEOUT ST (SAFETY) ×2 IMPLANT
DECANTER SPIKE VIAL GLASS SM (MISCELLANEOUS) ×2 IMPLANT
GLOVE BIO SURGEON STRL SZ7 (GLOVE) ×2 IMPLANT
GLOVE BIOGEL PI IND STRL 7.0 (GLOVE) ×2 IMPLANT
GLOVE BIOGEL PI INDICATOR 7.0 (GLOVE) ×2
GOWN PREVENTION PLUS LG XLONG (DISPOSABLE) ×3 IMPLANT
KIT BERKELEY 1ST TRIMESTER 3/8 (MISCELLANEOUS) ×2 IMPLANT
NDL SPNL 22GX3.5 QUINCKE BK (NEEDLE) ×1 IMPLANT
NEEDLE SPNL 22GX3.5 QUINCKE BK (NEEDLE) ×2 IMPLANT
NS IRRIG 1000ML POUR BTL (IV SOLUTION) ×2 IMPLANT
PACK VAGINAL MINOR WOMEN LF (CUSTOM PROCEDURE TRAY) ×2 IMPLANT
PAD PREP 24X48 CUFFED NSTRL (MISCELLANEOUS) ×2 IMPLANT
SET BERKELEY SUCTION TUBING (SUCTIONS) ×4 IMPLANT
SYR CONTROL 10ML LL (SYRINGE) ×2 IMPLANT
TOWEL OR 17X24 6PK STRL BLUE (TOWEL DISPOSABLE) ×4 IMPLANT
TUBING NON-CON 1/4 X 20 CONN (TUBING) ×1 IMPLANT
VACURETTE 10 RIGID CVD (CANNULA) ×1 IMPLANT
VACURETTE 7MM CVD STRL WRAP (CANNULA) IMPLANT
VACURETTE 8 RIGID CVD (CANNULA) IMPLANT
VACURETTE 9 RIGID CVD (CANNULA) IMPLANT
YANKAUER SUCT BULB TIP NO VENT (SUCTIONS) ×1 IMPLANT

## 2011-09-05 NOTE — Discharge Instructions (Signed)
Postpartum Hemorrhage  Postpartum hemorrhage is blood loss of more than 500 mL after childbirth. This is about two cups of blood. Most bleeding happens within 24 hours after birth. This is called primary postpartum hemorrhage. Less commonly, bleeding occurs more than 24 hours after birth, which is called secondary hemorrhage. The most common cause is uterine atony. This means your uterus does not shrink (contract) properly following delivery. When this happens, the blood vessels in the uterus are not squeezed and they keep on bleeding. This is serious.  SYMPTOMS   Bleeding after delivery is normal and you should expect vaginal bleeding. This means you will have bleeding coming from the birth canal for many days following childbirth. This is to be expected with both normal births and c-sections.   You are bleeding too much following your delivery if you are:   Passing large clots or pieces of tissue. This may be small pieces of placenta left after delivery.   Soaking more than one sanitary pad per hour for several hours.   Having heavy, bright-red bleeding which occurs four days or more after delivery.   Having a discharge which has a bad smell or if you begin to run an unexplained temperature over 101 F (38.3 C).   Having times of lightheadedness or fainting, feeling short of breath or having your heart beat fast with very little activity.  If you are having any of these symptoms, call your caregiver right away.  TREATMENT   Treatments used include medications and blood transfusions. If these treatments do not work, surgery may have to be done to remove the womb (uterus).   Sometimes bleeding occurs if portions of the afterbirth (placenta) are left behind in the uterus following delivery. If this happens, often a curettage or scraping of the inside of the uterus must be done. This usually stops the bleeding.   If bleeding is due to clotting or bleeding problems, which are not related to the pregnancy,  other treatments may be needed.  HOME CARE INSTRUCTIONS    Your caregiver may order bed rest (getting up to the bathroom only), or may allow you to continue light activity.   Keep track of the number of pads you use each day and how soaked (saturated) they are. Write this down.   Do no use tampons. Do not douche or have sexual intercourse until approved by your physician.   Rest and drink extra fluids.   Get proper amounts of rest and exercise.   If you are anemic following your pregnancy, be sure to take the iron and vitamin supplements that your caregiver recommends.   A diet rich in iron, such as spinach, red meat and legumes will be helpful.  SEEK IMMEDIATE MEDICAL CARE IF:   You experience severe cramps in your stomach, back or belly (abdomen).   You run an unexplained temperature of over 101 F (38.3 C) or as told by your caregiver.   You pass large clots or tissue. Save any tissue for your caregiver to look at.   Your bleeding increases or you become light-headed, weak, or faint.  Document Released: 08/31/2003 Document Revised: 05/30/2011 Document Reviewed: 03/25/2008  [employer] Patient Information 2012 [employer]

## 2011-09-05 NOTE — Anesthesia Preprocedure Evaluation (Signed)
Anesthesia Evaluation  Patient identified by MRN, date of birth, ID band Patient awake    Reviewed: Allergy & Precautions, H&P , NPO status , Patient's Chart, lab work & pertinent test results  Airway Mallampati: I TM Distance: >3 FB Neck ROM: Full    Dental  (+) Teeth Intact and Dental Advisory Given   Pulmonary  breath sounds clear to auscultation        Cardiovascular Rhythm:Regular Rate:Normal     Neuro/Psych    GI/Hepatic   Endo/Other    Renal/GU      Musculoskeletal   Abdominal   Peds  Hematology   Anesthesia Other Findings   Reproductive/Obstetrics                           Anesthesia Physical Anesthesia Plan  ASA: II  Anesthesia Plan: General   Post-op Pain Management:    Induction: Intravenous and Rapid sequence  Airway Management Planned: Oral ETT  Additional Equipment:   Intra-op Plan:   Post-operative Plan: Extubation in OR  Informed Consent: I have reviewed the patients History and Physical, chart, labs and discussed the procedure including the risks, benefits and alternatives for the proposed anesthesia with the patient or authorized representative who has indicated his/her understanding and acceptance.   Dental advisory given  Plan Discussed with: CRNA, Anesthesiologist and Surgeon  Anesthesia Plan Comments:         Anesthesia Quick Evaluation

## 2011-09-05 NOTE — MAU Provider Note (Addendum)
History   CSN: [id number], Arrival date and time: 09/05/11 0433  Chief Complaint  Patient presents with  . Vaginal Bleeding  . Post-op Problem   HPI 41 yo, G2P2. Prim c/section for breech, unlabored patient. Came with significant vaginal bleeding. Denies dizziness/ chest pain/ SOB.  No fever/chills, but some nausea, diarrhea yesterday and abdo pain with cramps, Ibuprofen not helping much. CHTN, on [place], was seen in office last week and given a 3 day diuretic course. Denies HA/vision probls.   Past Medical History  Diagnosis Date  . Hypertension   . PP care - s/p 1C/S (breech, SROM) 08/24/2011  . Maternal iron deficiency anemia 08/24/2011    Past Surgical History  Procedure Date  . Wisdom tooth extraction   . Cesarean section   . Tubal ligation     Family History  Problem Relation Age of Onset  . Hypertension Mother   . Hypertension Father   . Diabetes Father     History  Substance Use Topics  . Smoking status: Never Smoker   . Smokeless tobacco: Not on file  . Alcohol Use: No    Allergies:  Allergies  Allergen Reactions  . Sulfa Drugs Cross Reactors Hives and Swelling   Prescriptions prior to admission  Medication Sig Dispense Refill  . Fe Cbn-Fe Gluc-FA-B12-C-DSS (FERRALET 90) 90-1 MG TABS Take 1 tablet by mouth daily. Take on empty stomach between meals with cup of orange juice or Vit. C 250 mg. Do not take with dairy products or calcium.  30 each  3  . methyldopa (ALDOMET) 250 MG tablet Take 250 mg by mouth 2 (two) times daily.      . Prenatal Vit-Fe Fumarate-FA (PRENATAL MULTIVITAMIN) TABS Take 1 tablet by mouth daily.      Marland Kitchen docusate sodium (COLACE) 100 MG capsule Take 1 capsule (100 mg total) by mouth daily.  10 capsule  0  . ibuprofen (ADVIL,MOTRIN) 800 MG tablet Take 1 tablet (800 mg total) by mouth every 8 (eight) hours as needed for pain.  60 tablet  0  . oxyCODONE-acetaminophen (PERCOCET) 5-325 MG per tablet Take 1-2 tablets by mouth every 3 (three)  hours as needed (moderate - severe pain).  30 tablet  0   ROS Physical Exam   Blood pressure 150/83, pulse 75, temperature 97.6 F (36.4 C), temperature source Oral, resp. rate 18, height 5\' 4"  (1.626 m), last menstrual period 11/02/2010, SpO2 98.00%.  Physical Exam A&O x 3, in pain but pleasant Lungs CTA bilat CV RRR, S1S2 normal, no tachycardia Abdo soft, uterus enlarged 1 cm below umbilicus, slightly tender, but rest of the abdo is non-acute, c-section healing well.  Extr no edema/ tenderness Pelvic active bleeding, clots, Cervix finger tip open, unable to uterine internal eval. Uterus filled with clots, firm.   MAU Course  Procedures CBC - elevated WBC Sono - reviewed, large clots in cavity, unlikely any retained POCs.   Assessment and Plan:  Delayed PP hemorrhage, persistent anemia. Possible endometritis.  NPO since 8 pm, but ate small piece of protein bar at 3-3.30 am en route. Keep NPO in case surgical intervention with suction D&C needed.  Toradol 30 mg IM given with some pain relief.  Percocet PO with sip water Cytotec 800 mcg given rectally (no methergine) Augmentin 875 mg po once and will cont bid x 7 days for elevated WBC.  Risks/complications of procedure reviewed.   Will inform Dr Cherly Hensen in case she wants to continue care.   Casey Maxfield R 09/05/2011,  7:35 AM   MD note- Pt reassessed, VS stable, feels a bit better with Percocet, but bleeding and passing clots, uterus still filled with clots 2 cm below umbilicus.  Proceed with suction D&C ASAP to prevent further blood loss. Pre op orders given, OR getting ready. IV Ancef preop now and will cont Augmentin at home.  --V.Juliene Pina, MD

## 2011-09-05 NOTE — Transfer of Care (Signed)
Immediate Anesthesia Transfer of Care Note  Patient: Jaclyn Fields  Procedure(s) Performed: Procedure(s) (LRB): DILATATION AND EVACUATION (Bilateral)  Patient Location: PACU  Anesthesia Type: General  Level of Consciousness: awake, alert  and oriented  Airway & Oxygen Therapy: Patient Spontanous Breathing and Patient connected to nasal cannula oxygen  Post-op Assessment: Report given to PACU RN and Post -op Vital signs reviewed and stable  Post vital signs: Reviewed and stable  Complications: No apparent anesthesia complications

## 2011-09-05 NOTE — MAU Note (Signed)
Pt post partum C/S 08/23/2011 and BTL.  Reports light bleeding today, heavy bleeding and passing clots started around 0300.  Pt having abd pain.  Pt had 3 episodes of diarrhea today.

## 2011-09-05 NOTE — Anesthesia Postprocedure Evaluation (Signed)
  Anesthesia Post-op Note  Patient: Jaclyn Fields  Procedure(s) Performed: Procedure(s) (LRB): DILATATION AND EVACUATION (Bilateral)  Patient Location: PACU  Anesthesia Type: General  Level of Consciousness: awake  Airway and Oxygen Therapy: Patient Spontanous Breathing  Post-op Pain: none  Post-op Assessment: Post-op Vital signs reviewed, Patient's Cardiovascular Status Stable, Respiratory Function Stable, Patent Airway, No signs of Nausea or vomiting and Pain level controlled  Post-op Vital Signs: Reviewed and stable  Complications: No apparent anesthesia complications

## 2011-09-05 NOTE — MAU Note (Signed)
Dr. Mody in to see pt 

## 2011-09-05 NOTE — Progress Notes (Signed)
Patient ID: Jaclyn Fields, female   DOB: Oct 15, 1970, 41 y.o.   MRN: [id number] Pt seen in PACU, stable, no tachycardia, nl BP. H/H down to 7.1 Will transfuse 1 unit pRBCs and check Orthostatics and assess if more units needed. Pt agrees.

## 2011-09-05 NOTE — MAU Note (Signed)
Dr. Cherly Hensen in OR, no new orders for meds.

## 2011-09-05 NOTE — MAU Note (Signed)
Dr. Juliene Pina notified of results.  Order for Toradol rec'd.

## 2011-09-05 NOTE — H&P (Signed)
  See MAU note for H&P from this morning where I have complete H&P and plan.

## 2011-09-05 NOTE — MAU Note (Signed)
Dr. Juliene Pina notified of pt.  Orders rec'd.

## 2011-09-05 NOTE — MAU Note (Signed)
Anesthesia still attempting IV access.

## 2011-09-05 NOTE — Op Note (Signed)
Preoperative diagnosis: Postpartum hemorrhage (postpartum 13 days), retained products of conception Postop diagnosis: as above.  Procedure: Suction dilatation and evacuation Anesthesia General . Surgeon: Shea Evans, MD Assistant: none  IV fluids : 1200 cc LR Estimated blood loss : 300 cc Urine output:  straight catheter preop 40 cc pre-op Complications none Condition stable Disposition PACU  Specimen: products of conception sent to pathology.  Procedure:  Indication: Patient was seen in emergency room and noted to have enlarged uterus 1 cm below the umbilicus, tender and significant vaginal bleeding noted. Pelvic sono was consistent with blood clots and retained products of conception. Decision was made to proceed with suction D&E. Risks/ complications of surgery including infection, bleeding, damage to internal organs including uterine perforation, Asherman syndrome were reviewed. She voiced understanding and gave informed written consent.  Patient was brought to the operating room with IV running. She received preop 2 gm Ancef. She underwent general anesthesia without complications. She was given dorsolithotomy position. Parts were prepped and draped in standard fashion. Bladder was catheterized once.  Bimanual exam revealed uterus to be anteverted and enlarged, fundus 1 cm below umbilicus. Speculum was placed and cervix was grasped with single-tooth tenaculum. Cervical os was noted to be dilated (from 800 mcg Cytotec given few hours prior). A # 12 suction curette was introduced in the uterine cavity and suction evacuation of copious blood, clots, products of conception was done in multiple step wise fashion. Gentle sharp curettage was performed. Suction cannula was reintroduced and cavity deemed empty at this time. She also given Pitocin 20 units in IV bag and another 20 units as surgery was getting over and running it in PACU.  The bleeding was controlled well and uterus was firm on exam.   At this point the procedure was complete. All counts are correct x2. Specimen products of conception. No complications and brought to PACU stable.  Check CBC in PACU and based on that admission v/s discharge will be considered.  Dr Juliene Pina performed the entire surgery.   V.Sharlie Shreffler, MD.

## 2011-09-05 NOTE — Anesthesia Procedure Notes (Signed)
Procedure Name: Intubation Date/Time: 09/05/2011 11:50 AM Performed by: Shanon Payor Pre-anesthesia Checklist: Suction available, Emergency Drugs available, Timeout performed, Patient identified and Patient being monitored Patient Re-evaluated:Patient Re-evaluated prior to inductionOxygen Delivery Method: Circle system utilized Preoxygenation: Pre-oxygenation with 100% oxygen Intubation Type: IV induction Ventilation: Mask ventilation without difficulty Laryngoscope Size: Mac and 3 Grade View: Grade III Tube type: Oral Tube size: 7.0 mm Number of attempts: 1 Airway Equipment and Method: Stylet Placement Confirmation: ETT inserted through vocal cords under direct vision,  positive ETCO2 and breath sounds checked- equal and bilateral Secured at: 22 cm Tube secured with: Tape Dental Injury: Teeth and Oropharynx as per pre-operative assessment

## 2011-09-08 LAB — TYPE AND SCREEN
ABO/RH(D): O POS
Unit division: 0
Unit division: 0

## 2011-09-10 ENCOUNTER — Encounter (HOSPITAL_COMMUNITY): Payer: Self-pay | Admitting: Obstetrics & Gynecology

## 2011-09-11 NOTE — Discharge Summary (Signed)
Physician Discharge Summary  Patient ID: SIVAN CUELLO, MRN: [id number], DOB/AGE: 1971/05/07 41 y.o.  Admit date: 09/05/2011 Discharge date: 09/05/2011 Admitting MD: Dr Shea Evans.  Admission Diagnoses: Delayed postpartum hemorrhage, postpartum day 13 (s/p uncomplicated elective primary c-section for Breech), uterine atony and symptomatic anemia  Discharge Diagnoses: Same,         S/p suction evacuation with D&C          Post op blood transfusion with 2 units packed RBC.  Discharged Condition: Improved, stable.   Hospital Course: Patient presented with few days h/o abnormal vaginal bleeding, that got worse the morning of 3/14 and she came to hospital for evaluation, C/o abdominal/pelvic pain. Denies fever. Evaluation noted stable vital signs, but atonic enlarged uterus 1 cm below umbilicus. Pelvic sono noted uterus to be filled with clots with possible retained POCs. Cervical os was closed and rectal Cytotec failed to expel clots, bleeding was significant and hence we proceeded with D&C, suction evacuation under general anesthesia. Post-op H.H was low, hence she was admitted for Gyn unit for observation and 2 units blood transfusion.   Significant Diagnostic Studies: Pelvic sono  Discharge Exam: Blood pressure 147/84, pulse 90, temperature 98.7 F (37.1 C), temperature source Oral, resp. rate 18, height 5\' 4"  (1.626 m), weight 93.895 kg (207 lb), last menstrual period 11/02/2010, SpO2 97.00%. Stable, improved/   Disposition: 01-Home or Self Care  Discharge Orders    Future Orders Please Complete By Expires   Diet - low sodium heart healthy      Increase activity slowly      Driving Restrictions      Comments:   2 weeks   Sexual Activity Restrictions      Comments:   4 wks   Call MD for:  persistant nausea and vomiting      Call MD for:  temperature >100.4      Call MD for:  severe uncontrolled pain      Call MD for:  persistant dizziness or light-headedness      Call MD for:   extreme fatigue        Medication List  As of 09/11/2011  7:41 PM   STOP taking these medications         docusate sodium 100 MG capsule      ibuprofen 800 MG tablet         TAKE these medications         amoxicillin-clavulanate 875-125 MG per tablet   Commonly known as: AUGMENTIN   Take 1 tablet by mouth 2 (two) times daily.      Ferralet 90 90-1 MG Tabs   Take 1 tablet by mouth daily. Take on empty stomach between meals with cup of orange juice or Vit. C 250 mg.  Do not take with dairy products or calcium.      methyldopa 250 MG tablet   Commonly known as: ALDOMET   Take 250 mg by mouth 2 (two) times daily.      misoprostol 200 MCG tablet   Commonly known as: CYTOTEC   Take 1 tablet (200 mcg total) by mouth 3 (three) times daily.      oxyCODONE-acetaminophen 5-325 MG per tablet   Commonly known as: PERCOCET   Take 1-2 tablets by mouth every 6 (six) hours as needed (moderate - severe pain).      prenatal multivitamin Tabs   Take 1 tablet by mouth daily.  F/up Dr Cherly Hensen in 2 wks. Post op care and warning signs reviewed.   Signed: Jayme Mednick R 09/11/2011, 7:41 PM

## 2012-02-10 ENCOUNTER — Ambulatory Visit: Payer: Self-pay | Admitting: Family Medicine

## 2012-08-12 ENCOUNTER — Ambulatory Visit (INDEPENDENT_AMBULATORY_CARE_PROVIDER_SITE_OTHER): Payer: 59 | Admitting: Gynecology

## 2012-08-12 ENCOUNTER — Encounter: Payer: Self-pay | Admitting: Gynecology

## 2012-08-12 VITALS — BP 128/78

## 2012-08-12 DIAGNOSIS — Z9189 Other specified personal risk factors, not elsewhere classified: Secondary | ICD-10-CM

## 2012-08-12 DIAGNOSIS — R350 Frequency of micturition: Secondary | ICD-10-CM

## 2012-08-12 DIAGNOSIS — IMO0001 Reserved for inherently not codable concepts without codable children: Secondary | ICD-10-CM

## 2012-08-12 DIAGNOSIS — N760 Acute vaginitis: Secondary | ICD-10-CM

## 2012-08-12 DIAGNOSIS — N898 Other specified noninflammatory disorders of vagina: Secondary | ICD-10-CM

## 2012-08-12 DIAGNOSIS — Z9289 Personal history of other medical treatment: Secondary | ICD-10-CM | POA: Insufficient documentation

## 2012-08-12 DIAGNOSIS — R3915 Urgency of urination: Secondary | ICD-10-CM

## 2012-08-12 DIAGNOSIS — A499 Bacterial infection, unspecified: Secondary | ICD-10-CM

## 2012-08-12 LAB — URINALYSIS W MICROSCOPIC + REFLEX CULTURE
Hgb urine dipstick: NEGATIVE
Ketones, ur: NEGATIVE mg/dL
Nitrite: NEGATIVE
RBC / HPF: NONE SEEN RBC/hpf (ref <3)
Urobilinogen, UA: 0.2 mg/dL (ref 0.0–1.0)

## 2012-08-12 LAB — WET PREP FOR TRICH, YEAST, CLUE
WBC, Wet Prep HPF POC: NONE SEEN
Yeast Wet Prep HPF POC: NONE SEEN

## 2012-08-12 MED ORDER — METRONIDAZOLE 500 MG PO TABS: ORAL_TABLET | ORAL | Status: DC

## 2012-08-12 NOTE — Progress Notes (Signed)
Patient presented to the office today complaining of low abdominal discomfort urinary frequency along with vaginal discharge as well as urgency. Patient denied any fever chills nausea vomiting. Patient had a C-section January 2013 and had informed me that 2 weeks later she was brought back to the operating room for [facility] for retained products of conception. Patient had hemorrhage and received 2 units of packed red blood cell. She had bilateral tubal ligation at time of that cesarean section.  Exam: Back: No CVA tenderness Abdomen: Soft nontender no rebound or guarding Pelvic: Bartholin urethra Skene was within normal limits Vagina: Fishy clear-like discharge was evident. Cervix: No lesions were seen Uterus: Upper limits of normal slightly tender in the fundal region no palpable adnexal masses Rectal exam not done Urinalysis essentially benign some protein was noted and the urine otherwise normal.  Wet prep positive amine, few clue cells too numerous to count bacteria  Assessment/plan: Problem #1: Clinical evidence of bacterial vaginosis. Patient will be started on Flagyl 500 mg twice a day for 5 days. Problem #2 urinary frequency may be attributed from inflammation from vaginal infection although urinalysis was negative we will run a urine culture. Problem #3 since patient was transfused 2 units of packed red blood cells last year we will obtain a hepatitis B and C. and HIV today. Patient scheduled to return back in April for her annual exam or when necessary.

## 2012-08-12 NOTE — Patient Instructions (Signed)
Bacterial Vaginosis Bacterial vaginosis (BV) is a vaginal infection where the normal balance of bacteria in the vagina is disrupted. The normal balance is then replaced by an overgrowth of certain bacteria. There are several different kinds of bacteria that can cause BV. BV is the most common vaginal infection in women of childbearing age. CAUSES   The cause of BV is not fully understood. BV develops when there is an increase or imbalance of harmful bacteria.  Some activities or behaviors can upset the normal balance of bacteria in the vagina and put women at increased risk including:  Having a new sex partner or multiple sex partners.  Douching.  Using an intrauterine device (IUD) for contraception.  It is not clear what role sexual activity plays in the development of BV. However, women that have never had sexual intercourse are rarely infected with BV. Women do not get BV from toilet seats, bedding, swimming pools or from touching objects around them.  SYMPTOMS   Grey vaginal discharge.  A fish-like odor with discharge, especially after sexual intercourse.  Itching or burning of the vagina and vulva.  Burning or pain with urination.  Some women have no signs or symptoms at all. DIAGNOSIS  Your caregiver must examine the vagina for signs of BV. Your caregiver will perform lab tests and look at the sample of vaginal fluid through a microscope. They will look for bacteria and abnormal cells (clue cells), a pH test higher than 4.5, and a positive amine test all associated with BV.  RISKS AND COMPLICATIONS   Pelvic inflammatory disease (PID).  Infections following gynecology surgery.  Developing HIV.  Developing herpes virus. TREATMENT  Sometimes BV will clear up without treatment. However, all women with symptoms of BV should be treated to avoid complications, especially if gynecology surgery is planned. Female partners generally do not need to be treated. However, BV may spread  between female sex partners so treatment is helpful in preventing a recurrence of BV.   BV may be treated with antibiotics. The antibiotics come in either pill or vaginal cream forms. Either can be used with nonpregnant or pregnant women, but the recommended dosages differ. These antibiotics are not harmful to the baby.  BV can recur after treatment. If this happens, a second round of antibiotics will often be prescribed.  Treatment is important for pregnant women. If not treated, BV can cause a premature delivery, especially for a pregnant woman who had a premature birth in the past. All pregnant women who have symptoms of BV should be checked and treated.  For chronic reoccurrence of BV, treatment with a type of prescribed gel vaginally twice a week is helpful. HOME CARE INSTRUCTIONS   Finish all medication as directed by your caregiver.  Do not have sex until treatment is completed.  Tell your sexual partner that you have a vaginal infection. They should see their caregiver and be treated if they have problems, such as a mild rash or itching.  Practice safe sex. Use condoms. Only have 1 sex partner. PREVENTION  Basic prevention steps can help reduce the risk of upsetting the natural balance of bacteria in the vagina and developing BV:  Do not have sexual intercourse (be abstinent).  Do not douche.  Use all of the medicine prescribed for treatment of BV, even if the signs and symptoms go away.  Tell your sex partner if you have BV. That way, they can be treated, if needed, to prevent reoccurrence. SEEK MEDICAL CARE IF:     Your symptoms are not improving after 3 days of treatment.  You have increased discharge, pain, or fever. MAKE SURE YOU:   Understand these instructions.  Will watch your condition.  Will get help right away if you are not doing well or get worse. FOR MORE INFORMATION  Division of STD Prevention (DSTDP), Centers for Disease Control and Prevention:  www.cdc.gov/std American [employer] Health Association (ASHA): www.ashastd.org  Document Released: 06/10/2005 Document Revised: 09/02/2011 Document Reviewed: 12/01/2008 [employer] Patient Information 2013 [employer].  

## 2012-08-13 LAB — HEPATITIS B SURFACE ANTIGEN: Hepatitis B Surface Ag: NEGATIVE

## 2012-08-14 LAB — URINE CULTURE: Colony Count: 40000

## 2012-08-18 ENCOUNTER — Telehealth: Payer: Self-pay | Admitting: *Deleted

## 2012-08-18 DIAGNOSIS — N390 Urinary tract infection, site not specified: Secondary | ICD-10-CM

## 2012-08-18 NOTE — Telephone Encounter (Signed)
Drop of urine for culture ok.

## 2012-08-18 NOTE — Telephone Encounter (Signed)
Pt does have allergic to sulfa. Something else to give pt?

## 2012-08-18 NOTE — Telephone Encounter (Signed)
Have her drop a urine for culture today and schedule ultrasound for Thursday or Friday of this week to discuss results. When she comes to drop off urine sample please give her sample of Urogesic from the closet for her to take one PO QID for next two days to help with symptoms if she is not allergic to sulfa. ( while we wait for urine culture)

## 2012-08-18 NOTE — Telephone Encounter (Signed)
You can call in Pyridium 200 mg one by mouth 3 times a day for 2-3 days #9

## 2012-08-18 NOTE — Telephone Encounter (Signed)
Spoke with pt regarding the below regarding ultrasound and she said that since she has been at work her symptoms have stopped with the bloating and the pressure. She is going to come in for urine culture but asked if okay to hold off on ultrasound?

## 2012-08-18 NOTE — Telephone Encounter (Signed)
Pt will drop leave u/a.

## 2012-08-18 NOTE — Telephone Encounter (Signed)
Pt was seen on 08/12/12 given flagyl for BV infection, but also c/o low abdominal discomfort urinary frequency. Pt said she is still having slight pressure in abdominal area and feels bloated. She completed Rx as directed, she asked if this feeling should still be there? No burning with urination. Please advise

## 2012-09-25 ENCOUNTER — Encounter: Payer: Self-pay | Admitting: Gynecology

## 2012-09-25 ENCOUNTER — Ambulatory Visit (INDEPENDENT_AMBULATORY_CARE_PROVIDER_SITE_OTHER): Payer: Federal, State, Local not specified - PPO | Admitting: Gynecology

## 2012-09-25 VITALS — BP 120/82 | Ht 63.0 in | Wt 198.0 lb

## 2012-09-25 DIAGNOSIS — Z01419 Encounter for gynecological examination (general) (routine) without abnormal findings: Secondary | ICD-10-CM

## 2012-09-25 DIAGNOSIS — G4700 Insomnia, unspecified: Secondary | ICD-10-CM

## 2012-09-25 LAB — CBC WITH DIFFERENTIAL/PLATELET
Basophils Absolute: 0 10*3/uL (ref 0.0–0.1)
Basophils Relative: 1 % (ref 0–1)
HCT: 36.9 % (ref 36.0–46.0)
Hemoglobin: 12.5 g/dL (ref 12.0–15.0)
Lymphocytes Relative: 19 % (ref 12–46)
Monocytes Absolute: 0.8 10*3/uL (ref 0.1–1.0)
Neutro Abs: 6.2 10*3/uL (ref 1.7–7.7)
Neutrophils Relative %: 70 % (ref 43–77)
RDW: 13.8 % (ref 11.5–15.5)
WBC: 8.8 10*3/uL (ref 4.0–10.5)

## 2012-09-25 LAB — CHOLESTEROL, TOTAL: Cholesterol: 157 mg/dL (ref 0–200)

## 2012-09-25 LAB — TSH: TSH: 1.631 u[IU]/mL (ref 0.350–4.500)

## 2012-09-25 NOTE — Progress Notes (Signed)
Jaclyn Fields 01/16/1971 [id number]   History:    42 y.o.  for annual gyn exam with no complaints today. Patient is status post cesarean section 2013. Patient is not breast-feeding. Cycles reported to be regular. Patient had postpartum tubal sterilization procedure at time of her cesarean section. Patient with no past history of abnormal Pap smears. She did receive blood transfusion postpartum as a result of hemorrhage. In February of this year we tested her and her hepatitis B, C. And HIV were negative.  Past medical history,surgical history, family history and social history were all reviewed and documented in the EPIC chart.  Gynecologic History Patient's last menstrual period was 09/11/2012. Contraception: tubal ligation Last Pap: 2013. Results were: normal Last mammogram: 2010. Results were: normal but dense  Obstetric History OB History   Grav Para Term Preterm Abortions TAB SAB Ect Mult Living   2 1 1  1 1    1      # Outc Date GA Lbr Len/2nd Wgt Sex Del Anes PTL Lv   1 TRM 3/13 [redacted]w[redacted]d 00:00 6lb10.9oz(3.03kg) F LTCS Spinal  Yes   2 TAB                ROS: A ROS was performed and pertinent positives and negatives are included in the history.  GENERAL: No fevers or chills. HEENT: No change in vision, no earache, sore throat or sinus congestion. NECK: No pain or stiffness. CARDIOVASCULAR: No chest pain or pressure. No palpitations. PULMONARY: No shortness of breath, cough or wheeze. GASTROINTESTINAL: No abdominal pain, nausea, vomiting or diarrhea, melena or bright red blood per rectum. GENITOURINARY: No urinary frequency, urgency, hesitancy or dysuria. MUSCULOSKELETAL: No joint or muscle pain, no back pain, no recent trauma. DERMATOLOGIC: No rash, no itching, no lesions. ENDOCRINE: No polyuria, polydipsia, no heat or cold intolerance. No recent change in weight. HEMATOLOGICAL: No anemia or easy bruising or bleeding. NEUROLOGIC: No headache, seizures, numbness, tingling or weakness.  PSYCHIATRIC: No depression, no loss of interest in normal activity or change in sleep pattern.     Exam: chaperone present  BP 120/82  Ht 5\' 3"  (1.6 m)  Wt 198 lb (89.812 kg)  BMI 35.08 kg/m2  LMP 09/11/2012  Body mass index is 35.08 kg/(m^2).  General appearance : Well developed well nourished female. No acute distress HEENT: Neck supple, trachea midline, no carotid bruits, no thyroidmegaly Lungs: Clear to auscultation, no rhonchi or wheezes, or rib retractions  Heart: Regular rate and rhythm, no murmurs or gallops   Breast:Examined in sitting and supine position were symmetrical in appearance, no palpable masses or tenderness,  no skin retraction, no nipple inversion, no nipple discharge, no axillary or supraclavicular lymphadenopathy, bilateral areolar hyperpigmented areas as follows:  Physical Exam  Pulmonary/Chest:    Abdomen: no palpable masses or tenderness, no rebound or guarding Extremities: no edema or skin discoloration or tenderness  Pelvic:  Bartholin, Urethra, Skene Glands: Within normal limits             Vagina: No gross lesions or discharge  Cervix: No gross lesions or discharge  Uterus  anteverted, normal size, shape and consistency, non-tender and mobile  Adnexa  Without masses or tenderness  Anus and perineum  normal   Rectovaginal  normal sphincter tone without palpated masses or tenderness             Hemoccult not indicated     Assessment/Plan:  42 y.o. female for annual exam with incidental findings are described above of  bilateral hyperpigmented areas on both areolar tissue areas. Patient will return back to the office for biopsy of both areas in the next couple weeks. I've asked her to have her mammogram 3 be done first since she is overdue. The following labs were ordered today: CBC, TSH, and cholesterol and urinalysis. No Pap smear done today [place] discussed.    Ok Edwards MD, 5:06 PM 09/25/2012

## 2012-09-25 NOTE — Patient Instructions (Addendum)
Breast Self-Awareness  Practicing breast self-awareness may pick up problems early, prevent significant medical complications, and possibly save your life. By practicing breast self-awareness, you can become familiar with how your breasts look and feel and if your breasts are changing. This allows you to notice changes early. It can also offer you some reassurance that your breast health is good. One way to learn what is normal for your breasts and whether your breasts are changing is to do a breast self-exam.  If you find a lump or something that was not present in the past, it is best to contact your caregiver right away. Other findings that should be evaluated by your caregiver include nipple discharge, especially if it is bloody; skin changes or reddening; areas where the skin seems to be pulled in (retracted); or new lumps and bumps. Breast pain is seldom associated with cancer (malignancy), but should also be evaluated by a caregiver.  BREAST SELF-EXAM  The best time to examine your breasts is 5 7 days after your menstrual period is over. During menstruation, the breasts are lumpier, and it may be more difficult to pick up changes. If you do not menstruate, have reached menopause, or had your uterus removed (hysterectomy), you should examine your breasts at regular intervals, such as monthly. If you are breastfeeding, examine your breasts after a feeding or after using a breast pump. Breast implants do not decrease the risk for lumps or tumors, so continue to perform breast self-exams as recommended. Talk to your caregiver about how to determine the difference between the implant and breast tissue. Also, talk about the amount of pressure you should use during the exam. Over time, you will become more familiar with the variations of your breasts and more comfortable with the exam. A breast self-exam requires you to remove all your clothes above the waist.    Look at your breasts and nipples. Stand in front of  a mirror in a room with good lighting. With your hands on your hips, push your hands firmly downward. Look for a difference in shape, contour, and size from one breast to the other (asymmetry). Asymmetry includes puckers, dips, or bumps. Also, look for skin changes, such as reddened or scaly areas on the breasts. Look for nipple changes, such as discharge, dimpling, repositioning, or redness.   Carefully feel your breasts. This is best done either in the shower or tub while using soapy water or when flat on your back. Place the arm (on the side of the breast you are examining) above your head. Use the pads (not the fingertips) of your three middle fingers on your opposite hand to feel your breasts. Start in the underarm area and use  inch (2 cm) overlapping circles to feel your breast. Use 3 different levels of pressure (light, medium, and firm pressure) at each circle before moving to the next circle. The light pressure is needed to feel the tissue closest to the skin. The medium pressure will help to feel breast tissue a little deeper, while the firm pressure is needed to feel the tissue close to the ribs. Continue the overlapping circles, moving downward over the breast until you feel your ribs below your breast. Then, move one finger-width towards the center of the body. Continue to use the  inch (2 cm) overlapping circles to feel your breast as you move slowly up toward the collar bone (clavicle) near the base of the neck. Continue the up and down exam using all 3 pressures   until you reach the middle of the chest. Do this with each breast, carefully feeling for lumps or changes.   Keep a written record with breast changes or normal findings for each breast. By writing this information down, you do not need to depend only on memory for size, tenderness, or location. Write down where you are in your menstrual cycle, if you are still menstruating.   Breast tissue can have some lumps or thick tissue. However,  see your caregiver if you find anything that concerns you.   SEEK MEDICAL CARE IF:   You see a change in shape, contour, or size of your breasts or nipples.    You see skin changes, such as reddened or scaly areas on the breasts or nipples.    You have an unusual discharge from your nipples.    You feel a new lump or unusually thick areas.   Document Released: 06/10/2005 Document Revised: 12/10/2011 Document Reviewed: 09/25/2011  [employer] Patient Information 2013 [employer].

## 2012-09-26 LAB — URINALYSIS W MICROSCOPIC + REFLEX CULTURE
Bacteria, UA: NONE SEEN
Hgb urine dipstick: NEGATIVE
Ketones, ur: NEGATIVE mg/dL
Leukocytes, UA: NEGATIVE
Nitrite: NEGATIVE
Protein, ur: 30 mg/dL — AB
Urobilinogen, UA: 0.2 mg/dL (ref 0.0–1.0)
pH: 6 (ref 5.0–8.0)

## 2012-10-05 ENCOUNTER — Other Ambulatory Visit: Payer: Self-pay

## 2012-10-05 ENCOUNTER — Other Ambulatory Visit: Payer: Self-pay | Admitting: Gynecology

## 2012-10-05 DIAGNOSIS — Z1231 Encounter for screening mammogram for malignant neoplasm of breast: Secondary | ICD-10-CM

## 2012-10-07 ENCOUNTER — Ambulatory Visit (INDEPENDENT_AMBULATORY_CARE_PROVIDER_SITE_OTHER): Payer: Federal, State, Local not specified - PPO | Admitting: Gynecology

## 2012-10-07 ENCOUNTER — Encounter: Payer: Self-pay | Admitting: Gynecology

## 2012-10-07 ENCOUNTER — Other Ambulatory Visit: Payer: Self-pay | Admitting: Gynecology

## 2012-10-07 VITALS — BP 124/80

## 2012-10-07 DIAGNOSIS — N649 Disorder of breast, unspecified: Secondary | ICD-10-CM

## 2012-10-07 DIAGNOSIS — J0110 Acute frontal sinusitis, unspecified: Secondary | ICD-10-CM

## 2012-10-07 MED ORDER — AMOXICILLIN-POT CLAVULANATE 875-125 MG PO TABS: 1.0000 | ORAL_TABLET | Freq: Two times a day (BID) | ORAL | Status: DC

## 2012-10-07 MED ORDER — FLUCONAZOLE 100 MG PO TABS: ORAL_TABLET | ORAL | Status: DC

## 2012-10-07 NOTE — Progress Notes (Signed)
Patient presented to the office today for 2 reasons.the first reason was that she had been concern of these 2 different hyperpigmented areas on her areolar region of breast and was here for a biopsy of each breast. Also she has been complaining week and a half of slight productive cough postnasal drainage and sinus congestion. She denied any fever.Patient is status post cesarean section 2013. Patient is not breast-feeding. Cycles reported to be regular. Patient had postpartum tubal sterilization procedure at time of her cesarean section.  Exam: Lungs clear to auscultation Rogers or wheezes Tender frontal sinuses Oropharyngeal no plaques No submandibular lymphadenopathy  2 separate breasts areolar biopsies were obtained the first one was at the 2:00 position of the left breast areolar region and the second one was in the 4:00 position of the right breast areolar tissue. Both of these separate lesions were hyperpigmented and slightly raised. The area was cleansed with Betadine solution and 1% lidocaine was infiltrated at the base. With a scalpel a small superficial biopsy was obtained and submitted separately and identified accordingly. Silver nitrate was applied and Neosporin and a Band-Aid.  Assessment/plan: Patient had bilateral breast areolar biopsy for hyperpigmented lesions. Will await pathology report. Her sinusitis she's going to be placed on Augmentin 875 mg 1 by mouth twice a day for 7 days.

## 2012-10-07 NOTE — Patient Instructions (Signed)

## 2013-03-24 ENCOUNTER — Encounter: Payer: Self-pay | Admitting: Gynecology

## 2013-03-24 ENCOUNTER — Ambulatory Visit (INDEPENDENT_AMBULATORY_CARE_PROVIDER_SITE_OTHER): Payer: 59 | Admitting: Gynecology

## 2013-03-24 VITALS — BP 120/78

## 2013-03-24 DIAGNOSIS — A499 Bacterial infection, unspecified: Secondary | ICD-10-CM

## 2013-03-24 DIAGNOSIS — R102 Pelvic and perineal pain: Secondary | ICD-10-CM

## 2013-03-24 DIAGNOSIS — M5380 Other specified dorsopathies, site unspecified: Secondary | ICD-10-CM

## 2013-03-24 DIAGNOSIS — B9689 Other specified bacterial agents as the cause of diseases classified elsewhere: Secondary | ICD-10-CM

## 2013-03-24 DIAGNOSIS — R350 Frequency of micturition: Secondary | ICD-10-CM

## 2013-03-24 DIAGNOSIS — N9489 Other specified conditions associated with female genital organs and menstrual cycle: Secondary | ICD-10-CM

## 2013-03-24 DIAGNOSIS — N760 Acute vaginitis: Secondary | ICD-10-CM

## 2013-03-24 DIAGNOSIS — N750 Cyst of Bartholin's gland: Secondary | ICD-10-CM | POA: Insufficient documentation

## 2013-03-24 DIAGNOSIS — M62830 Muscle spasm of back: Secondary | ICD-10-CM

## 2013-03-24 LAB — WET PREP FOR TRICH, YEAST, CLUE
WBC, Wet Prep HPF POC: NONE SEEN
Yeast Wet Prep HPF POC: NONE SEEN

## 2013-03-24 LAB — URINALYSIS W MICROSCOPIC + REFLEX CULTURE
Glucose, UA: NEGATIVE mg/dL
Hgb urine dipstick: NEGATIVE
Leukocytes, UA: NEGATIVE
Nitrite: NEGATIVE
Specific Gravity, Urine: 1.025 (ref 1.005–1.030)
WBC, UA: NONE SEEN WBC/hpf (ref <3)
pH: 6 (ref 5.0–8.0)

## 2013-03-24 MED ORDER — CYCLOBENZAPRINE HCL 5 MG PO TABS: ORAL_TABLET | ORAL | Status: DC

## 2013-03-24 MED ORDER — IBUPROFEN 800 MG PO TABS: 800.0000 mg | ORAL_TABLET | Freq: Three times a day (TID) | ORAL | Status: DC | PRN

## 2013-03-24 MED ORDER — CLINDAMYCIN PHOSPHATE 2 % VA CREA: 1.0000 | TOPICAL_CREAM | Freq: Every day | VAGINAL | Status: DC

## 2013-03-24 NOTE — Patient Instructions (Addendum)
Bacterial Vaginosis Bacterial vaginosis (BV) is a vaginal infection where the normal balance of bacteria in the vagina is disrupted. The normal balance is then replaced by an overgrowth of certain bacteria. There are several different kinds of bacteria that can cause BV. BV is the most common vaginal infection in women of childbearing age. CAUSES   The cause of BV is not fully understood. BV develops when there is an increase or imbalance of harmful bacteria.  Some activities or behaviors can upset the normal balance of bacteria in the vagina and put women at increased risk including:  Having a new sex partner or multiple sex partners.  Douching.  Using an intrauterine device (IUD) for contraception.  It is not clear what role sexual activity plays in the development of BV. However, women that have never had sexual intercourse are rarely infected with BV. Women do not get BV from toilet seats, bedding, swimming pools or from touching objects around them.  SYMPTOMS   Grey vaginal discharge.  A fish-like odor with discharge, especially after sexual intercourse.  Itching or burning of the vagina and vulva.  Burning or pain with urination.  Some women have no signs or symptoms at all. DIAGNOSIS  Your caregiver must examine the vagina for signs of BV. Your caregiver will perform lab tests and look at the sample of vaginal fluid through a microscope. They will look for bacteria and abnormal cells (clue cells), a pH test higher than 4.5, and a positive amine test all associated with BV.  RISKS AND COMPLICATIONS   Pelvic inflammatory disease (PID).  Infections following gynecology surgery.  Developing HIV.  Developing herpes virus. TREATMENT  Sometimes BV will clear up without treatment. However, all women with symptoms of BV should be treated to avoid complications, especially if gynecology surgery is planned. Female partners generally do not need to be treated. However, BV may spread  between female sex partners so treatment is helpful in preventing a recurrence of BV.   BV may be treated with antibiotics. The antibiotics come in either pill or vaginal cream forms. Either can be used with nonpregnant or pregnant women, but the recommended dosages differ. These antibiotics are not harmful to the baby.  BV can recur after treatment. If this happens, a second round of antibiotics will often be prescribed.  Treatment is important for pregnant women. If not treated, BV can cause a premature delivery, especially for a pregnant woman who had a premature birth in the past. All pregnant women who have symptoms of BV should be checked and treated.  For chronic reoccurrence of BV, treatment with a type of prescribed gel vaginally twice a week is helpful. HOME CARE INSTRUCTIONS   Finish all medication as directed by your caregiver.  Do not have sex until treatment is completed.  Tell your sexual partner that you have a vaginal infection. They should see their caregiver and be treated if they have problems, such as a mild rash or itching.  Practice safe sex. Use condoms. Only have 1 sex partner. PREVENTION  Basic prevention steps can help reduce the risk of upsetting the natural balance of bacteria in the vagina and developing BV:  Do not have sexual intercourse (be abstinent).  Do not douche.  Use all of the medicine prescribed for treatment of BV, even if the signs and symptoms go away.  Tell your sex partner if you have BV. That way, they can be treated, if needed, to prevent reoccurrence. SEEK MEDICAL CARE IF:     Your symptoms are not improving after 3 days of treatment.  You have increased discharge, pain, or fever. MAKE SURE YOU:   Understand these instructions.  Will watch your condition.  Will get help right away if you are not doing well or get worse. FOR MORE INFORMATION  Division of STD Prevention (DSTDP), Centers for Disease Control and Prevention:  [employer]Apps.[employer] American Social Health Association (ASHA): www.ashastd.org  Document Released: 06/10/2005 Document Revised: 09/02/2011 Document Reviewed: 12/01/2008 [facility] Patient Information 2014 [place], [place]. Bartholin's Cyst or Abscess Bartholin's glands are small glands located within the folds of skin (labia) along the sides of the lower opening of the vagina (birth canal). A cyst may develop when the duct of the gland becomes blocked. When this happens, fluid that accumulates within the cyst can become infected. This is known as an abscess. The Bartholin gland produces a mucous fluid to lubricate the outside of the vagina during sexual intercourse. SYMPTOMS   Patients with a small cyst may not have any symptoms.  Mild discomfort to severe pain depending on the size of the cyst and if it is infected (abscess).  Pain, redness, and swelling around the lower opening of the vagina.  Painful intercourse.  Pressure in the perineal area.  Swelling of the lips of the vagina (labia).  The cyst or abscess can be on one side or both sides of the vagina. DIAGNOSIS   A large swelling is seen in the lower vagina area by your caregiver.  Painful to touch.  Redness and pain, if it is an abscess. TREATMENT   Sometimes the cyst will go away on its own.  Apply warm wet compresses to the area or take hot sitz baths several times a day.  An incision to drain the cyst or abscess with local anesthesia.  Culture the pus, if it is an abscess.  Antibiotic treatment, if it is an abscess.  Cut open the gland and suture the edges to make the opening of the gland bigger (marsupialization).  Remove the whole gland if the cyst or abscess returns. PREVENTION   Practice good hygiene.  Clean the vaginal area with a mild soap and soft cloth when bathing.  Do not rub hard in the vaginal area when bathing.  Protect the crotch area with a padded cushion if you take long bike rides or ride  horses.  Be sure you are well lubricated when you have sexual intercourse. HOME CARE INSTRUCTIONS   If your cyst or abscess was opened, a small piece of gauze, or a drain, may have been placed in the wound to allow drainage. Do not remove this gauze or drain unless directed by your caregiver.  Wear feminine pads, not tampons, as needed for any drainage or bleeding.  If antibiotics were prescribed, take them exactly as directed. Finish the entire course.  Only take over-the-counter or prescription medicines for pain, discomfort, or fever as directed by your caregiver. SEEK IMMEDIATE MEDICAL CARE IF:   You have an increase in pain, redness, swelling, or drainage.  You have bleeding from the wound which results in the use of more than the number of pads suggested by your caregiver in 24 hours.  You have chills.  You have a fever.  You develop any new problems (symptoms) or aggravation of your existing condition. MAKE SURE YOU:   Understand these instructions.  Will watch your condition.  Will get help right away if you are not doing well or get worse. Document Released: 06/10/2005 Document Revised:  09/02/2011 Document Reviewed: 01/27/2008 [employer] Patient Information 2014 [place], [place]. Muscle Strain Muscle strain occurs when a muscle is stretched beyond its normal length. A small number of muscle fibers generally are torn. This is especially common in athletes. This happens when a sudden, violent force placed on a muscle stretches it too far. Usually, recovery from muscle strain takes 1 to 2 weeks. Complete healing will take 5 to 6 weeks.  HOME CARE INSTRUCTIONS  While awake, apply ice to the sore muscle for the first 2 days after the injury. Put ice in a plastic bag. Place a towel between your skin and the bag. Leave the ice on for 15-20 minutes each hour. Do not use the strained muscle for several days, until you no longer have pain. You may wrap the injured area with an  elastic bandage for comfort. Be careful not to wrap it too tightly. This may interfere with blood circulation or increase swelling. Only take over-the-counter or prescription medicines for pain, discomfort, or fever as directed by your caregiver. SEEK MEDICAL CARE IF:  You have increasing pain or swelling in the injured area. MAKE SURE YOU:  Understand these instructions. Will watch your condition. Will get help right away if you are not doing well or get worse. Document Released: 06/10/2005 Document Revised: 09/02/2011 Document Reviewed: 06/22/2011 [facility] Patient Information 2014 [place], [place].

## 2013-03-24 NOTE — Progress Notes (Signed)
Patient presented to the office today with several complaints. She was complaining of some low back discomfort after she was lifting her child. She was also having urgency but no dysuria and some low suprapubic pressure. Patient denied any fever, chills, nausea, or vomitin she did not report any vaginal discharge. Her last menstrual period was 2 weeks ago reportedly normal.  Exam: Back: Mid thoracic vertebrae paraspinous muscle tender with palpation Pelvic: Bartholin's urethra Skene: left Bartholin gland duct cyst 3 x 4 cm nontender Vagina: No gross lesions or discharge on inspection Cervix: No lesions or discharge Bimanual exam: Uterus anteverted normal size shape and consistency Adnexa: No palpable mass or tenderness Rectal exam not done  A urinalysis: 0-2 RBC, rare bacteria, heavy mucus, and protein 100 mg/dl  Wet prep: Tumors to count bacteria, few clue cells  Assessment/plan: Problems #1: Musculoskeletal back spasm will be treated with Flexeril 5 mg each bedtime along with Motrin 800 mg 3 times a day for 7 days. Patient to apply Crista Elliot topical to her back at bedtime. Problem #2 bacterial vaginosis: Patient will be treated with Cleocin vaginal cream to apply each bedtime for one week Problem #3 frequency, proteinuria otherwise negative urinalysis culture pending. Patient with no dysuria Problem #4 left Bartholin gland duct cyst will return next week for I&D after BV infection has been treated.

## 2013-04-15 ENCOUNTER — Ambulatory Visit: Payer: 59 | Admitting: Gynecology

## 2013-05-25 ENCOUNTER — Ambulatory Visit (INDEPENDENT_AMBULATORY_CARE_PROVIDER_SITE_OTHER): Payer: 59

## 2013-05-25 ENCOUNTER — Other Ambulatory Visit: Payer: Self-pay

## 2013-05-25 VITALS — BP 153/92 | HR 69 | Resp 16 | Ht 64.0 in | Wt 189.0 lb

## 2013-05-25 DIAGNOSIS — S93609A Unspecified sprain of unspecified foot, initial encounter: Secondary | ICD-10-CM

## 2013-05-25 DIAGNOSIS — M7750 Other enthesopathy of unspecified foot: Secondary | ICD-10-CM

## 2013-05-25 DIAGNOSIS — R52 Pain, unspecified: Secondary | ICD-10-CM

## 2013-05-25 DIAGNOSIS — M778 Other enthesopathies, not elsewhere classified: Secondary | ICD-10-CM

## 2013-05-25 MED ORDER — MELOXICAM 15 MG PO TABS: 15.0000 mg | ORAL_TABLET | Freq: Every day | ORAL | Status: DC

## 2013-05-25 NOTE — Progress Notes (Signed)
   Subjective:    Patient ID: Jaclyn Fields, female    DOB: 1971/06/18, 42 y.o.   MRN: [id number] "I have pain across the top of my foot, ball of foot and in my heel on the left foot."  Foot Pain This is a new problem. Episode onset: 3 weeks. The problem occurs constantly. The problem has been gradually worsening. The symptoms are aggravated by walking. She has tried NSAIDs and ice (Dr. Doneen Poisson, xrays, darco shoe, prescribed prednisone and tramadol.) for the symptoms. The treatment provided no relief.      Review of Systems  Musculoskeletal: Positive for gait problem.  All other systems reviewed and are negative.       Objective:   Physical Exam Neurovascular status is intact bilateral. Pedal pulses palpable DP and PT +2/4 bilateral Refill time 3 seconds all digits. There is mild + edema of both feet left slightly worse. Neurologically epicritic and proprioceptive sensations intact and symmetric bilateral normal plantar response and DTRs. There is no pain elicited and vibratory sensation testing of the metatarsals no suspect stress fracture noted at this time. X-rays reveal rectus metatarsals sesamoid position 1 hallux rectus slight degenerative changes Lisfranc for fifth metatarsal and cuboid articulation as well as lesser metatarsal and cuboid articulation is cross Lisfranc joint. Mild osteopenia noted. No additional pain on palpation and Magan plantar fascia medial calcaneal tubercle. Patient currently wearing a pair of flimsier flexible UGGS type boot or shoe. Has not been wearing anything around the house with any support structure. When this started to happen shoes were [place]boy type boots or shoes that aggravated her feet.       Assessment & Plan:  Assessment this time is capsulitis/Lisfranc's joint mid tarsus area of the left foot there is pain on eversion inversion of Lisfranc joint. No pain in any other areas of the foot on exam at this time. X-rays reveal no fracture  no other cystic changes or abnormalities noted. Plan at this time patient placed in a fascial strapping and a prescription for Mobic is dispensed x30 days 50 mg per day recheck in 2 weeks for followup maintain a strapping for 5 day. Also suggested ice to the foot in a stiff soled walking or athletic type shoe preferably for the next several weeks. Recheck in 2 weeks for followup next  Alvan Dame DPM

## 2013-05-25 NOTE — Patient Instructions (Signed)

## 2013-06-08 ENCOUNTER — Ambulatory Visit (INDEPENDENT_AMBULATORY_CARE_PROVIDER_SITE_OTHER): Payer: 59

## 2013-06-08 VITALS — BP 145/86 | HR 69 | Resp 16 | Ht 64.0 in | Wt 185.0 lb

## 2013-06-08 DIAGNOSIS — M7750 Other enthesopathy of unspecified foot: Secondary | ICD-10-CM

## 2013-06-08 DIAGNOSIS — R52 Pain, unspecified: Secondary | ICD-10-CM

## 2013-06-08 DIAGNOSIS — M778 Other enthesopathies, not elsewhere classified: Secondary | ICD-10-CM

## 2013-06-08 NOTE — Progress Notes (Signed)
   Subjective:    Patient ID: Jaclyn Fields, female    DOB: 1970/07/18, 42 y.o.   MRN: [id number]  "It's doing a lot better.  That taping really helped."  HPI    Review of Systems no new changes     Objective:   Physical Exam Neurovascular status is intact pedal pulses palpable. Patient continues to have some pain left foot first MTP area had significant improvement with the taping in place wearing a pair of high heel shoes it feel better and the heels to the solid rigid shank avoid flimsy shoes or flip-flops. Patient does have some slight capsulitis a Lisfranc's although improved with a fascial strapping and a stable shoe. Patient been taking Mobic which is been helping as well. No other new findings are new changes neurovascular status is intact pedal pulses palpable capsulitis first MTP area as well as Lisfranc joint bilateral to       Assessment & Plan:  Assessment capsulitis with abnormality gait and weakness of the arch bilateral plan at this time fascial strapping this or beneficial based on improvement patient is a strong candidate for functional orthoses orthotics skin carried out at this time for functional dress-type orthotic and using dress and casual shoes patient contacted in followup in the next month orthotics are ready for fitting and dispensing orthotics skin carried at this time discussed continued use of Mobic followup for any exacerbations occur in the interim.  Alvan Dame DPM

## 2013-06-08 NOTE — Patient Instructions (Signed)
WEARING INSTRUCTIONS FOR ORTHOTICS  Don't expect to be comfortable wearing your orthotic devices for the first time.  Like eyeglasses, you may be aware of them as time passes, they will not be uncomfortable and you will enjoy wearing them.  FOLLOW THESE INSTRUCTIONS EXACTLY!  1. Wear your orthotic devices for:       Not more than 1 hour the first day.       Not more than 2 hours the second day.       Not more than 3 hours the third day and so on.        Or wear them for as long as they feel comfortable.       If you experience discomfort in your feet or legs take them out.  When feet & legs feel       better, put them back in.  You do need to be consistent and wear them a little        everyday. 2.   If at any time the orthotic devices become acutely uncomfortable before the       time for that particular day, STOP WEARING THEM. 3.   On the next day, do not increase the wearing time. 4.   Subsequently, increase the wearing time by 15-30 minutes only if comfortable to do       so. 5.   You will be seen by your doctor about 2-4 weeks after you receive your orthotic       devices, at which time you will probably be wearing your devices comfortably        for about 8 hours or more a day. 6.   Some patients occasionally report mild aches or discomfort in other parts of the of       body such as the knees, hips or back after 3 or 4 consecutive hours of wear.  If this       is the case with you, do not extend your wearing time.  Instead, cut it back an hour or       two.  In all likelihood, these symptoms will disappear in a short period of time as your       body posture realigns itself and functions more efficiently. 7.   It is possible that your orthotic device may require some small changes or adjustment       to improve their function or make them more comfortable.   This is usually not done       before one to three months have elapsed.  These adjustments are made in        accordance  with the changed position your feet are assuming as a result of       improved biomechanical function. 8.   In women's shoes, it's not unusual for your heel to slip out of the shoe, particularly if       they are step-in-shoes.  If this is the case, try other shoes or other styles.  Try to       purchase shoes which have deeper heal seats or higher heel counters. 9.   Squeaking of orthotics devices in the shoes is due to the movement of the devices       when they are functioning normally.  To eliminate squeaking, simply dust some       baby powder into your shoes before inserting the devices.  If this does not work,          apply soap or wax to the edges of the orthotic devices or put a tissue into the shoes. 10. It is important that you follow these directions explicitly.  Failure to do so will simply       prolong the adjustment period or create problems which are easily avoided.  It makes       no difference if you are wearing your orthotic devices for only a few hours after        several months, so long as you are wearing them comfortably for those hours. 11. If you have any questions or complaints, contact our office.  We have no way of       knowing about your problems unless you tell us.  If we do not hear from you, we will       assume that you are proceeding well.     ICE INSTRUCTIONS  Apply ice or cold pack to the affected area at least 3 times a day for 10-15 minutes each time.  You should also use ice after prolonged activity or vigorous exercise.  Do not apply ice longer than 20 minutes at one time.  Always keep a cloth between your skin and the ice pack to prevent burns.  Being consistent and following these instructions will help control your symptoms.  We suggest you purchase a gel ice pack because they are reusable and do bit leak.  Some of them are designed to wrap around the area.  Use the method that works best for you.  Here are some other suggestions for icing.   Use a  frozen bag of peas or corn-inexpensive and molds well to your body, usually stays frozen for 10 to 20 minutes.  Wet a towel with cold water and squeeze out the excess until it's damp.  Place in a bag in the freezer for 20 minutes. Then remove and use. 

## 2013-08-13 ENCOUNTER — Ambulatory Visit: Payer: 59 | Admitting: Gynecology

## 2013-09-30 ENCOUNTER — Encounter: Payer: 59 | Admitting: Gynecology

## 2013-10-21 ENCOUNTER — Encounter: Payer: Self-pay | Admitting: Gynecology

## 2013-10-21 ENCOUNTER — Other Ambulatory Visit (HOSPITAL_COMMUNITY)
Admission: RE | Admit: 2013-10-21 | Discharge: 2013-10-21 | Disposition: A | Discharge status: Home / Self Care | Payer: 59 | Source: Ambulatory Visit | Attending: Gynecology | Admitting: Gynecology

## 2013-10-21 ENCOUNTER — Ambulatory Visit (INDEPENDENT_AMBULATORY_CARE_PROVIDER_SITE_OTHER): Payer: 59 | Admitting: Gynecology

## 2013-10-21 VITALS — BP 128/84 | Ht 63.0 in | Wt 193.0 lb

## 2013-10-21 DIAGNOSIS — Z01419 Encounter for gynecological examination (general) (routine) without abnormal findings: Secondary | ICD-10-CM

## 2013-10-21 DIAGNOSIS — R102 Pelvic and perineal pain: Secondary | ICD-10-CM

## 2013-10-21 DIAGNOSIS — N949 Unspecified condition associated with female genital organs and menstrual cycle: Secondary | ICD-10-CM

## 2013-10-21 DIAGNOSIS — Z1151 Encounter for screening for human papillomavirus (HPV): Secondary | ICD-10-CM | POA: Insufficient documentation

## 2013-10-21 DIAGNOSIS — N750 Cyst of Bartholin's gland: Secondary | ICD-10-CM | POA: Insufficient documentation

## 2013-10-21 LAB — URINALYSIS W MICROSCOPIC + REFLEX CULTURE
Bilirubin Urine: NEGATIVE
GLUCOSE, UA: NEGATIVE mg/dL
KETONES UR: NEGATIVE mg/dL
LEUKOCYTES UA: NEGATIVE
NITRITE: NEGATIVE
PH: 6 (ref 5.0–8.0)
PROTEIN: 30 mg/dL — AB
Specific Gravity, Urine: 1.02 (ref 1.005–1.030)
Urobilinogen, UA: 0.2 mg/dL (ref 0.0–1.0)

## 2013-10-21 LAB — CBC WITH DIFFERENTIAL/PLATELET
BASOS ABS: 0 10*3/uL (ref 0.0–0.1)
BASOS PCT: 0 % (ref 0–1)
EOS ABS: 0.2 10*3/uL (ref 0.0–0.7)
Eosinophils Relative: 2 % (ref 0–5)
HCT: 38.1 % (ref 36.0–46.0)
Hemoglobin: 12.9 g/dL (ref 12.0–15.0)
Lymphocytes Relative: 19 % (ref 12–46)
Lymphs Abs: 1.7 10*3/uL (ref 0.7–4.0)
MCH: 30.1 pg (ref 26.0–34.0)
MCHC: 33.9 g/dL (ref 30.0–36.0)
MCV: 88.8 fL (ref 78.0–100.0)
Monocytes Absolute: 0.4 10*3/uL (ref 0.1–1.0)
Monocytes Relative: 5 % (ref 3–12)
NEUTROS ABS: 6.6 10*3/uL (ref 1.7–7.7)
NEUTROS PCT: 74 % (ref 43–77)
Platelets: 326 10*3/uL (ref 150–400)
RBC: 4.29 MIL/uL (ref 3.87–5.11)
RDW: 13.2 % (ref 11.5–15.5)
WBC: 8.9 10*3/uL (ref 4.0–10.5)

## 2013-10-21 LAB — CHOLESTEROL, TOTAL: Cholesterol: 161 mg/dL (ref 0–200)

## 2013-10-21 NOTE — Patient Instructions (Signed)
Bartholin's Cyst or Abscess Bartholin's glands are small glands located within the folds of skin (labia) along the sides of the lower opening of the vagina (birth canal). A cyst may develop when the duct of the gland becomes blocked. When this happens, fluid that accumulates within the cyst can become infected. This is known as an abscess. The Bartholin gland produces a mucous fluid to lubricate the outside of the vagina during sexual intercourse. SYMPTOMS   Patients with a small cyst may not have any symptoms.  Mild discomfort to severe pain depending on the size of the cyst and if it is infected (abscess).  Pain, redness, and swelling around the lower opening of the vagina.  Painful intercourse.  Pressure in the perineal area.  Swelling of the lips of the vagina (labia).  The cyst or abscess can be on one side or both sides of the vagina. DIAGNOSIS   A large swelling is seen in the lower vagina area by your caregiver.  Painful to touch.  Redness and pain, if it is an abscess. TREATMENT   Sometimes the cyst will go away on its own.  Apply warm wet compresses to the area or take hot sitz baths several times a day.  An incision to drain the cyst or abscess with local anesthesia.  Culture the pus, if it is an abscess.  Antibiotic treatment, if it is an abscess.  Cut open the gland and suture the edges to make the opening of the gland bigger (marsupialization).  Remove the whole gland if the cyst or abscess returns. PREVENTION   Practice good hygiene.  Clean the vaginal area with a mild soap and soft cloth when bathing.  Do not rub hard in the vaginal area when bathing.  Protect the crotch area with a padded cushion if you take long bike rides or ride horses.  Be sure you are well lubricated when you have sexual intercourse. HOME CARE INSTRUCTIONS   If your cyst or abscess was opened, a small piece of gauze, or a drain, may have been placed in the wound to allow  drainage. Do not remove this gauze or drain unless directed by your caregiver.  Wear feminine pads, not tampons, as needed for any drainage or bleeding.  If antibiotics were prescribed, take them exactly as directed. Finish the entire course.  Only take over-the-counter or prescription medicines for pain, discomfort, or fever as directed by your caregiver. SEEK IMMEDIATE MEDICAL CARE IF:   You have an increase in pain, redness, swelling, or drainage.  You have bleeding from the wound which results in the use of more than the number of pads suggested by your caregiver in 24 hours.  You have chills.  You have a fever.  You develop any new problems (symptoms) or aggravation of your existing condition. MAKE SURE YOU:   Understand these instructions.  Will watch your condition.  Will get help right away if you are not doing well or get worse. Document Released: 06/10/2005 Document Revised: 09/02/2011 Document Reviewed: 01/27/2008 Exit[employer] Patient Information 2014 [employer].  

## 2013-10-21 NOTE — Progress Notes (Signed)
Jaclyn Fields 05-13-1971 [id number]   History:    43 y.o.  for annual gyn exam with on-and-off left lower quadrant discomfort at time of ovulation otherwise doing well. Patient had a postpartum tubal sterilization procedure time of her cesarean section in 2013. As a result of hemorrhage she had received a blood transfusion. In February of 2014 patient was tested for hepatitis B, C. and HIV all were negative. She stated that her PCP last week did some blood work whose been monitored for hypertension. She is overdue for mammogram.  Past medical history,surgical history, family history and social history were all reviewed and documented in the EPIC chart.  Gynecologic History Patient's last menstrual period was 09/29/2013. Contraception: tubal ligation Last Pap: 2014?Marland Kitchen Results were: normal Last mammogram: 2010. Results were: normal  Obstetric History OB History  Gravida Para Term Preterm AB SAB TAB Ectopic Multiple Living  2 1 1  1  1   1     # Outcome Date GA Lbr Len/2nd Weight Sex Delivery Anes PTL Lv  2 TRM 08/23/11 [redacted]w[redacted]d  6 lb 10.9 oz (3.03 kg) F LTCS Spinal  Y  1 TAB                ROS: A ROS was performed and pertinent positives and negatives are included in the history.  GENERAL: No fevers or chills. HEENT: No change in vision, no earache, sore throat or sinus congestion. NECK: No pain or stiffness. CARDIOVASCULAR: No chest pain or pressure. No palpitations. PULMONARY: No shortness of breath, cough or wheeze. GASTROINTESTINAL: No abdominal pain, nausea, vomiting or diarrhea, melena or bright red blood per rectum. GENITOURINARY: No urinary frequency, urgency, hesitancy or dysuria. MUSCULOSKELETAL: No joint or muscle pain, no back pain, no recent trauma. DERMATOLOGIC: No rash, no itching, no lesions. ENDOCRINE: No polyuria, polydipsia, no heat or cold intolerance. No recent change in weight. HEMATOLOGICAL: No anemia or easy bruising or bleeding. NEUROLOGIC: No headache, seizures,  numbness, tingling or weakness. PSYCHIATRIC: No depression, no loss of interest in normal activity or change in sleep pattern.     Exam: chaperone present  BP 128/84  Ht 5\' 3"  (1.6 m)  Wt 193 lb (87.544 kg)  BMI 34.20 kg/m2  LMP 09/29/2013  Body mass index is 34.2 kg/(m^2).  General appearance : Well developed well nourished female. No acute distress HEENT: Neck supple, trachea midline, no carotid bruits, no thyroidmegaly Lungs: Clear to auscultation, no rhonchi or wheezes, or rib retractions  Heart: Regular rate and rhythm, no murmurs or gallops Breast:Examined in sitting and supine position were symmetrical in appearance, no palpable masses or tenderness,  no skin retraction, no nipple inversion, no nipple discharge, no skin discoloration, no axillary or supraclavicular lymphadenopathy Abdomen: no palpable masses or tenderness, no rebound or guarding Extremities: no edema or skin discoloration or tenderness  Pelvic:  Bartholin, Urethra, Skene Glands: Within normal limits             Vagina: Left Bartholin duct cyst  Cervix: No gross lesions or discharge  Uterus  anteverted, normal size, shape and consistency, non-tender and mobile  Adnexa  Without masses or tenderness  Anus and perineum  normal   Rectovaginal  normal sphincter tone without palpated masses or tenderness             Hemoccult not indicated     Assessment/Plan:  43 y.o. female for annual exam with left Bartholin duct cyst will return back to the office next week for incision  and drainage. She was given a requisition schedule her mammogram. We discussed the importance of monthly breast exam. Patient suffers that time from [place]. Normal pelvic exam today. A CBC screening corresponding urinalysis will be done today.  Note: This dictation was prepared with  Dragon/digital dictation along withSmart phrase technology. Any transcriptional errors that result from this process are unintentional.   Terrance Mass MD, 11:13 AM 10/21/2013

## 2013-10-27 ENCOUNTER — Telehealth: Payer: Self-pay | Admitting: *Deleted

## 2013-10-27 NOTE — Telephone Encounter (Signed)
Pt scheduled to come in tomorrow to have bartholin cyst removal pt said her cycle has started now. Pt asked if keep appointment or reschedule? Please advise

## 2013-10-27 NOTE — Telephone Encounter (Signed)
Transferred message to donna to reschedule.

## 2013-10-27 NOTE — Telephone Encounter (Signed)
Reschedule for next week

## 2013-10-28 ENCOUNTER — Ambulatory Visit: Payer: 59 | Admitting: Gynecology

## 2013-11-01 ENCOUNTER — Ambulatory Visit
Admission: RE | Admit: 2013-11-01 | Discharge: 2013-11-01 | Disposition: A | Discharge status: Home / Self Care | Payer: 59 | Source: Ambulatory Visit

## 2013-11-01 DIAGNOSIS — Z1231 Encounter for screening mammogram for malignant neoplasm of breast: Secondary | ICD-10-CM

## 2013-11-05 ENCOUNTER — Ambulatory Visit (INDEPENDENT_AMBULATORY_CARE_PROVIDER_SITE_OTHER): Payer: 59 | Admitting: Gynecology

## 2013-11-05 ENCOUNTER — Encounter: Payer: Self-pay | Admitting: Gynecology

## 2013-11-05 VITALS — BP 128/86

## 2013-11-05 DIAGNOSIS — N750 Cyst of Bartholin's gland: Secondary | ICD-10-CM | POA: Insufficient documentation

## 2013-11-05 MED ORDER — DOXYCYCLINE HYCLATE 100 MG PO CAPS: 100.0000 mg | ORAL_CAPSULE | Freq: Two times a day (BID) | ORAL | Status: DC

## 2013-11-05 MED ORDER — OXYCODONE-ACETAMINOPHEN 5-500 MG PO CAPS: 1.0000 | ORAL_CAPSULE | ORAL | Status: DC | PRN

## 2013-11-05 NOTE — Addendum Note (Signed)
Addended by: Thurnell Garbe A on: 11/05/2013 03:09 PM   Modules accepted: Orders

## 2013-11-05 NOTE — Patient Instructions (Signed)
Bartholin's Cyst or Abscess Bartholin's glands are small glands located within the folds of skin (labia) along the sides of the lower opening of the vagina (birth canal). A cyst may develop when the duct of the gland becomes blocked. When this happens, fluid that accumulates within the cyst can become infected. This is known as an abscess. The Bartholin gland produces a mucous fluid to lubricate the outside of the vagina during sexual intercourse. SYMPTOMS   Patients with a small cyst may not have any symptoms.  Mild discomfort to severe pain depending on the size of the cyst and if it is infected (abscess).  Pain, redness, and swelling around the lower opening of the vagina.  Painful intercourse.  Pressure in the perineal area.  Swelling of the lips of the vagina (labia).  The cyst or abscess can be on one side or both sides of the vagina. DIAGNOSIS   A large swelling is seen in the lower vagina area by your caregiver.  Painful to touch.  Redness and pain, if it is an abscess. TREATMENT   Sometimes the cyst will go away on its own.  Apply warm wet compresses to the area or take hot sitz baths several times a day.  An incision to drain the cyst or abscess with local anesthesia.  Culture the pus, if it is an abscess.  Antibiotic treatment, if it is an abscess.  Cut open the gland and suture the edges to make the opening of the gland bigger (marsupialization).  Remove the whole gland if the cyst or abscess returns. PREVENTION   Practice good hygiene.  Clean the vaginal area with a mild soap and soft cloth when bathing.  Do not rub hard in the vaginal area when bathing.  Protect the crotch area with a padded cushion if you take long bike rides or ride horses.  Be sure you are well lubricated when you have sexual intercourse. HOME CARE INSTRUCTIONS   If your cyst or abscess was opened, a small piece of gauze, or a drain, may have been placed in the wound to allow  drainage. Do not remove this gauze or drain unless directed by your caregiver.  Wear feminine pads, not tampons, as needed for any drainage or bleeding.  If antibiotics were prescribed, take them exactly as directed. Finish the entire course.  Only take over-the-counter or prescription medicines for pain, discomfort, or fever as directed by your caregiver. SEEK IMMEDIATE MEDICAL CARE IF:   You have an increase in pain, redness, swelling, or drainage.  You have bleeding from the wound which results in the use of more than the number of pads suggested by your caregiver in 24 hours.  You have chills.  You have a fever.  You develop any new problems (symptoms) or aggravation of your existing condition. MAKE SURE YOU:   Understand these instructions.  Will watch your condition.  Will get help right away if you are not doing well or get worse. Document Released: 06/10/2005 Document Revised: 09/02/2011 Document Reviewed: 01/27/2008 Exit[employer] Patient Information 2014 [employer].  

## 2013-11-05 NOTE — Progress Notes (Signed)
   Patient presented to the office today for incision and drainage of a large vaginal Bartholin duct cyst/abscess. It was noted at time of her annual exam on 11/20/2013. Patient had felt a bulge was does not really complaining of any major pains.  Exam: A 3 x 4 cm left Bartholin duct mass was noted. The patient was counseled and the area was cleansed with Betadine solution. One percent lidocaine was infiltrated submucosally at the inferior portion of the cystic-like mass for approximately 5 cc. With a sterile scalpel a vertical small incision was made an extensive amount of mucoid brown discharge was expelled from the Bartholin cyst. Aerobic and anaerobic cultures were obtained. The area was copiously irrigated with normal saline solution. A Word catheter was placed inside the defect additional And not to allow the gland to drain for at least one more week.  Assessment/plan: Status post incision and drainage of Bartholin duct cysts with Word catheter in place patient will be placed on prophylaxis Vibramycin 100 mg twice a day for 7 days. The patient was prescribed Tylox to take one by mouth q. 4-6 hours when necessary. She will return back to the office next week for followup.

## 2013-11-08 LAB — WOUND CULTURE
GRAM STAIN: NONE SEEN
Gram Stain: NONE SEEN

## 2013-11-12 ENCOUNTER — Encounter: Payer: Self-pay | Admitting: Gynecology

## 2013-11-12 ENCOUNTER — Ambulatory Visit (INDEPENDENT_AMBULATORY_CARE_PROVIDER_SITE_OTHER): Payer: 59 | Admitting: Gynecology

## 2013-11-12 VITALS — BP 130/86

## 2013-11-12 DIAGNOSIS — N750 Cyst of Bartholin's gland: Secondary | ICD-10-CM

## 2013-11-12 MED ORDER — CLINDAMYCIN PHOSPHATE 2 % VA CREA: TOPICAL_CREAM | VAGINAL | Status: DC

## 2013-11-12 NOTE — Progress Notes (Signed)
   Patient presented to the office today to remove her Ward catheter from the left Bartholin duct cyst that was I&D on May 15. Prophylactically patient had been placed on Vibramycin 100 mg twice a day for 7 days. She is otherwise doing well.  Exam: The Ward catheter was removed from the site of the left mouth and gland. Area completely free of infection. Mucoid material present. Neosporin applied.  Assessment/plan: Patient status post I&D of left Bartholin duct cyst 1 week having had a Ward catheter removed today. Patient will be placed on Cleocin vaginal cream for prophylaxis to apply twice a day for 7 days along with sitz baths and to refrain from intercourse to return to see me in 10 days for followup.

## 2013-11-24 ENCOUNTER — Ambulatory Visit: Payer: 59 | Admitting: Gynecology

## 2013-11-29 ENCOUNTER — Encounter: Payer: Self-pay | Admitting: Gynecology

## 2013-11-29 ENCOUNTER — Ambulatory Visit (INDEPENDENT_AMBULATORY_CARE_PROVIDER_SITE_OTHER): Payer: 59 | Admitting: Gynecology

## 2013-11-29 VITALS — BP 126/82

## 2013-11-29 DIAGNOSIS — N750 Cyst of Bartholin's gland: Secondary | ICD-10-CM

## 2013-11-29 NOTE — Progress Notes (Signed)
   Patient presented to the office today after having an I&D of a "undocked cysts that measure 3 x 4 cm on 11/05/2013 C. previous note. A Ward Catheter had been placed which she could only tolerate for a week and was removed a week later. She presents for followup. She had been placed on prophylactic antibiotics consisting of Vibramycin twice a day for 7 days. She is asymptomatic.  Exam: Bartholin's urethra Skene glands within normal limits area of the Bartholin gland I and D. completely healed no fluctuation nontender and nonerythematous.  Assessment/plan: Patient status post incision and drainage of left Bartholin duct cyst completely healed doing well but resumed for normal activity will return next year for annual exam or when necessary.

## 2014-03-04 ENCOUNTER — Encounter: Payer: Self-pay | Admitting: Women's Health

## 2014-03-04 ENCOUNTER — Ambulatory Visit (INDEPENDENT_AMBULATORY_CARE_PROVIDER_SITE_OTHER): Payer: 59 | Admitting: Women's Health

## 2014-03-04 DIAGNOSIS — N760 Acute vaginitis: Secondary | ICD-10-CM

## 2014-03-04 DIAGNOSIS — A499 Bacterial infection, unspecified: Secondary | ICD-10-CM

## 2014-03-04 DIAGNOSIS — B9689 Other specified bacterial agents as the cause of diseases classified elsewhere: Secondary | ICD-10-CM

## 2014-03-04 DIAGNOSIS — N390 Urinary tract infection, site not specified: Secondary | ICD-10-CM

## 2014-03-04 DIAGNOSIS — N898 Other specified noninflammatory disorders of vagina: Secondary | ICD-10-CM

## 2014-03-04 LAB — URINALYSIS W MICROSCOPIC + REFLEX CULTURE
Bilirubin Urine: NEGATIVE
Casts: NONE SEEN
Crystals: NONE SEEN
Glucose, UA: NEGATIVE mg/dL
Ketones, ur: NEGATIVE mg/dL
LEUKOCYTES UA: NEGATIVE
NITRITE: NEGATIVE
PH: 6.5 (ref 5.0–8.0)
PROTEIN: 100 mg/dL — AB
Specific Gravity, Urine: 1.02 (ref 1.005–1.030)
Urobilinogen, UA: 0.2 mg/dL (ref 0.0–1.0)
WBC, UA: NONE SEEN WBC/hpf (ref <3)

## 2014-03-04 LAB — WET PREP FOR TRICH, YEAST, CLUE
Trich, Wet Prep: NONE SEEN
WBC WET PREP: NONE SEEN
YEAST WET PREP: NONE SEEN

## 2014-03-04 MED ORDER — METRONIDAZOLE 500 MG PO TABS: 500.0000 mg | ORAL_TABLET | Freq: Two times a day (BID) | ORAL | Status: DC

## 2014-03-04 NOTE — Patient Instructions (Signed)
Bacterial Vaginosis Bacterial vaginosis is an infection of the vagina. It happens when too many of certain germs (bacteria) grow in the vagina. HOME CARE  Take your medicine as told by your doctor.  Finish your medicine even if you start to feel better.  Do not have sex until you finish your medicine and are better.  Tell your sex partner that you have an infection. They should see their doctor for treatment.  Practice safe sex. Use condoms. Have only one sex partner. GET HELP IF:  You are not getting better after 3 days of treatment.  You have more grey fluid (discharge) coming from your vagina than before.  You have more pain than before.  You have a fever. MAKE SURE YOU:   Understand these instructions.  Will watch your condition.  Will get help right away if you are not doing well or get worse. Document Released: 03/19/2008 Document Revised: 03/31/2013 Document Reviewed: 01/20/2013 Exit[employer] Patient Information 2015 [employer]. This information is not intended to replace advice given to you by your health care provider. Make sure you discuss any questions you have with your health care provider.  

## 2014-03-04 NOTE — Progress Notes (Signed)
Patient ID: Jaclyn Fields, female   DOB: 1971/06/01, 43 y.o.   MRN: [id number] Presents with complaints of urinary frequency, low back pain, abd pain, white vaginal discharge with an abnormal odor.  Denies hematuria, dysuria, pain at the end of stream, dyspareunia, vaginal itching, or fever.  BTL/regular menses.    Exam: Well-appearing. External genital exam noninflamed enlarged 3-4 cm Bartholin gland on left side.  Speculum exam show moderate amount of white discharge without erythema.  Wet prep shows positive amine, moderate clue cells, bacteria TNTC, 3-6 epithelial cells.   UA: Trace blood, 100mg /dL protein, 0-2 RBC, few bacteria, and many squamous epithelial cells.  Bacterial Vaginosis  Plan:  Flagyl 500mg  PO BID x 7 days #14.  Reviewed alcohol precautions.   Avoid wearing thong underwear due to irritation of Bartholin gland. Instructed to call if no relief of discharge.

## 2014-03-08 LAB — URINE CULTURE

## 2014-04-25 ENCOUNTER — Encounter: Payer: Self-pay | Admitting: Women's Health

## 2014-05-10 ENCOUNTER — Ambulatory Visit (INDEPENDENT_AMBULATORY_CARE_PROVIDER_SITE_OTHER): Payer: 59 | Admitting: Gynecology

## 2014-05-10 ENCOUNTER — Encounter: Payer: Self-pay | Admitting: Gynecology

## 2014-05-10 VITALS — BP 144/88

## 2014-05-10 DIAGNOSIS — N750 Cyst of Bartholin's gland: Secondary | ICD-10-CM | POA: Insufficient documentation

## 2014-05-10 MED ORDER — DOXYCYCLINE HYCLATE 100 MG PO CAPS: ORAL_CAPSULE | ORAL | Status: DC

## 2014-05-10 MED ORDER — OXYCODONE-ACETAMINOPHEN 5-325 MG PO TABS: 1.0000 | ORAL_TABLET | ORAL | Status: DC | PRN

## 2014-05-10 NOTE — Progress Notes (Signed)
   Patient is a 43 year old that presented to the office today complaining of a bulging sensation in her left vaginal area. Review of her record indicated that back in May 2015 she had incision and drainage of a large Bartholin duct cyst that measured 3 x 4 cm. A Ward catheter had been placed but patient requested it be removed after week because of much discomfort. Patient with previous tubal ligation.  Exam: Bartholin urethra Skene glands: Left Bartholin duct cyst measuring 3 x 4 cm nontender  The patient was counseled for incision and drainage of Bartholin duct cyst. Ear was cleansed with Betadine solution. 1% lidocaine for a total of 6 cc were infiltrated submucosally. A small stab incision was made in the Bartholin duct cyst and chocolate-like material extruded. Cultures for MRSA were obtained. The area was copiously irrigated with hydrogen peroxide. The loculations were broken down. A Ward catheter was then placed.  Assessment/plan: Second time in 6 months the patient has a Bartholin duct cyst. We hope that she will be able to maintain for 2 weeks the Ward catheter before we remove it. She will be placed on a wide spectrum antibiotic such as Vibramycin 100 mg twice a day or 2 weeks. She was prescribed also Percocet 5/325 mg to take 1 pill every 6 hours when necessary. If she has a recurrent of the Bartholin duct cysts we will need to schedule an outpatient surgery for marsupialization.

## 2014-05-10 NOTE — Addendum Note (Signed)
Addended by: Thurnell Garbe A on: 05/10/2014 11:27 AM   Modules accepted: Orders

## 2014-05-10 NOTE — Patient Instructions (Signed)
Bartholin's Cyst or Abscess °Bartholin's glands are small glands located within the folds of skin (labia) along the sides of the lower opening of the vagina (birth canal). A cyst may develop when the duct of the gland becomes blocked. When this happens, fluid that accumulates within the cyst can become infected. This is known as an abscess. The Bartholin gland produces a mucous fluid to lubricate the outside of the vagina during sexual intercourse. °SYMPTOMS  °· Patients with a small cyst may not have any symptoms. °· Mild discomfort to severe pain depending on the size of the cyst and if it is infected (abscess). °· Pain, redness, and swelling around the lower opening of the vagina. °· Painful intercourse. °· Pressure in the perineal area. °· Swelling of the lips of the vagina (labia). °· The cyst or abscess can be on one side or both sides of the vagina. °DIAGNOSIS  °· A large swelling is seen in the lower vagina area by your caregiver. °· Painful to touch. °· Redness and pain, if it is an abscess. °TREATMENT  °· Sometimes the cyst will go away on its own. °· Apply warm wet compresses to the area or take hot sitz baths several times a day. °· An incision to drain the cyst or abscess with local anesthesia. °· Culture the pus, if it is an abscess. °· Antibiotic treatment, if it is an abscess. °· Cut open the gland and suture the edges to make the opening of the gland bigger (marsupialization). °· Remove the whole gland if the cyst or abscess returns. °PREVENTION  °· Practice good hygiene. °· Clean the vaginal area with a mild soap and soft cloth when bathing. °· Do not rub hard in the vaginal area when bathing. °· Protect the crotch area with a padded cushion if you take long bike rides or ride horses. °· Be sure you are well lubricated when you have sexual intercourse. °HOME CARE INSTRUCTIONS  °· If your cyst or abscess was opened, a small piece of gauze, or a drain, may have been placed in the wound to allow  drainage. Do not remove this gauze or drain unless directed by your caregiver. °· Wear feminine pads, not tampons, as needed for any drainage or bleeding. °· If antibiotics were prescribed, take them exactly as directed. Finish the entire course. °· Only take over-the-counter or prescription medicines for pain, discomfort, or fever as directed by your caregiver. °SEEK IMMEDIATE MEDICAL CARE IF:  °· You have an increase in pain, redness, swelling, or drainage. °· You have bleeding from the wound which results in the use of more than the number of pads suggested by your caregiver in 24 hours. °· You have chills. °· You have a fever. °· You develop any new problems (symptoms) or aggravation of your existing condition. °MAKE SURE YOU:  °· Understand these instructions. °· Will watch your condition. °· Will get help right away if you are not doing well or get worse. °Document Released: 06/10/2005 Document Revised: 09/02/2011 Document Reviewed: 01/27/2008 °Exit[employer]® Patient Information ©2015 [employer]. This information is not intended to replace advice given to you by your health care provider. Make sure you discuss any questions you have with your health care provider. ° °

## 2014-05-13 LAB — WOUND CULTURE
Gram Stain: NONE SEEN
Gram Stain: NONE SEEN

## 2014-05-24 ENCOUNTER — Ambulatory Visit (INDEPENDENT_AMBULATORY_CARE_PROVIDER_SITE_OTHER): Payer: 59 | Admitting: Gynecology

## 2014-05-24 ENCOUNTER — Encounter: Payer: Self-pay | Admitting: Gynecology

## 2014-05-24 VITALS — BP 142/88

## 2014-05-24 DIAGNOSIS — N750 Cyst of Bartholin's gland: Secondary | ICD-10-CM

## 2014-05-24 NOTE — Progress Notes (Signed)
   Patient presented for her -2 weeks follow-up after having had an IUD of a left Bartholin duct cyst and placement of a Ward catheter. This was the second Bartholin duct cyst on the same side this year. The previous Bartholin duct gland cyst was in May 2015.  As we waited for culture she was started on Vibramycin 100 mg twice a day for 14 days in the event of MRSA. Her cultures returned with the following:  Gram Stain Rare   Gram Stain WBC present-both PMN and Mononuclear   Gram Stain No Squamous Epithelial Cells Seen   Gram Stain No Organisms Seen   Organism ID, Bacteria Few GROUP B STREP (S.AGALACTIAE) ISOLATED   Comments: Testing against S. agalactiae not routinely  performed due to predictability of  AMP/PEN/VAN susceptibility.     Patient is doing well the Ward catheter was removed. The area was debrided with hydrogen peroxide. It was nonerythematous nontender and noninflamed and nondraining.  Patient will return back to the office in April for her annual exam or when necessary. She will apply Neosporin twice a day to the area. She will use antibacterial self as well.

## 2015-02-13 ENCOUNTER — Encounter: Payer: Self-pay | Admitting: Family Medicine

## 2015-02-13 ENCOUNTER — Ambulatory Visit: Payer: Self-pay | Admitting: Family Medicine

## 2015-02-13 ENCOUNTER — Ambulatory Visit (INDEPENDENT_AMBULATORY_CARE_PROVIDER_SITE_OTHER): Payer: 59 | Admitting: Family Medicine

## 2015-02-13 VITALS — BP 120/80 | HR 70

## 2015-02-13 DIAGNOSIS — J0100 Acute maxillary sinusitis, unspecified: Secondary | ICD-10-CM

## 2015-02-13 DIAGNOSIS — G4489 Other headache syndrome: Secondary | ICD-10-CM

## 2015-02-13 MED ORDER — FLUTICASONE PROPIONATE 50 MCG/ACT NA SUSP: 2.0000 | Freq: Every day | NASAL | Status: DC

## 2015-02-13 MED ORDER — AZITHROMYCIN 250 MG PO TABS: ORAL_TABLET | ORAL | Status: DC

## 2015-02-13 NOTE — Progress Notes (Signed)
Name: Jaclyn Fields   MRN: [id number]    DOB: 05-16-1971   Date:02/13/2015       Progress Note  Subjective  Chief Complaint  Chief Complaint  Patient presents with  . Sinusitis    Sinusitis This is a new problem. The current episode started 1 to 4 weeks ago. The problem has been waxing and waning since onset. There has been no fever. Her pain is at a severity of 7/10. The pain is moderate. Associated symptoms include headaches and sinus pressure. Pertinent negatives include no chills, congestion, coughing, diaphoresis, ear pain, hoarse voice, neck pain, shortness of breath, sneezing, sore throat or swollen glands. Past treatments include oral decongestants. The treatment provided no relief.  Headache  This is a recurrent problem. The current episode started 1 to 4 weeks ago. The pain is located in the bilateral region. The pain radiates to the face. The quality of the pain is described as aching. The pain is moderate. Associated symptoms include sinus pressure. Pertinent negatives include no abdominal pain, back pain, blurred vision, coughing, dizziness, drainage, ear pain, eye watering, fever, insomnia, nausea, neck pain, phonophobia, photophobia, sore throat, swollen glands, tingling, tinnitus or weight loss. The symptoms are aggravated by emotional stress. The treatment provided mild relief.    No problem-specific assessment & plan notes found for this encounter.   Past Medical History  Diagnosis Date  . Hypertension   . PP care - s/p 1C/S (breech, [facility]) 08/24/2011  . Maternal iron deficiency anemia 08/24/2011  . Bartholin's duct cyst     Bartholin duct cyst 2 left last one 2015    Past Surgical History  Procedure Laterality Date  . Wisdom tooth extraction    . Cesarean section    . Tubal ligation    . Dilation and evacuation  09/05/2011    Procedure: DILATATION AND EVACUATION;  Surgeon: Elveria Royals, MD;  Location: [facility];  Service: Gynecology;  Laterality: Bilateral;     Family History  Problem Relation Age of Onset  . Hypertension Mother   . Hypertension Father   . Diabetes Father     Social History   Social History  . Marital Status: Single    Spouse Name: N/A  . Number of Children: N/A  . Years of Education: N/A   Occupational History  . Not on file.   Social History Main Topics  . Smoking status: Never Smoker   . Smokeless tobacco: Not on file  . Alcohol Use: 0.0 oz/week     Comment: occasionally  . Drug Use: No  . Sexual Activity: Yes    Birth Control/ Protection: Surgical     Comment: tubal ligation   Other Topics Concern  . Not on file   Social History Narrative    No Known Allergies   Review of Systems  Constitutional: Negative for fever, chills, weight loss, malaise/fatigue and diaphoresis.  HENT: Positive for sinus pressure. Negative for congestion, ear discharge, ear pain, hoarse voice, sneezing, sore throat and tinnitus.   Eyes: Negative for blurred vision and photophobia.  Respiratory: Negative for cough, sputum production, shortness of breath and wheezing.   Cardiovascular: Negative for chest pain, palpitations and leg swelling.  Gastrointestinal: Negative for heartburn, nausea, abdominal pain, diarrhea, constipation, blood in stool and melena.  Genitourinary: Negative for dysuria, urgency, frequency and hematuria.  Musculoskeletal: Negative for myalgias, back pain, joint pain and neck pain.  Skin: Negative for rash.  Neurological: Positive for headaches. Negative for dizziness, tingling, sensory change  and focal weakness.  Endo/Heme/Allergies: Negative for environmental allergies and polydipsia. Does not bruise/bleed easily.  Psychiatric/Behavioral: Negative for depression and suicidal ideas. The patient is not nervous/anxious and does not have insomnia.      Objective  Filed Vitals:   02/13/15 1541  BP: 120/80  Pulse: 70    Physical Exam  Constitutional: She is well-developed, well-nourished, and in  no distress. No distress.  HENT:  Head: Normocephalic and atraumatic.  Right Ear: External ear normal.  Left Ear: External ear normal.  Nose: Nose normal.  Mouth/Throat: Oropharynx is clear and moist.  Eyes: Conjunctivae and EOM are normal. Pupils are equal, round, and reactive to light. Right eye exhibits no discharge. Left eye exhibits no discharge.  Neck: Normal range of motion. Neck supple. No JVD present. No thyromegaly present.  Cardiovascular: Normal rate, regular rhythm, normal heart sounds and intact distal pulses.  Exam reveals no gallop and no friction rub.   No murmur heard. Pulmonary/Chest: Effort normal and breath sounds normal.  Abdominal: Soft. Bowel sounds are normal. She exhibits no mass. There is no tenderness. There is no guarding.  Musculoskeletal: Normal range of motion. She exhibits no edema.  Lymphadenopathy:    She has no cervical adenopathy.  Neurological: She is alert. She has normal reflexes.  Skin: Skin is warm and dry. She is not diaphoretic.  Psychiatric: Mood and affect normal.      Assessment & Plan  Problem List Items Addressed This Visit    None    Visit Diagnoses    Acute maxillary sinusitis, recurrence not specified    -  Primary    Relevant Medications    azithromycin (ZITHROMAX) 250 MG tablet    fluticasone (FLONASE) 50 MCG/ACT nasal spray    Headache syndrome        likely sinus         Dr. Macon [facility]  02/13/2015

## 2015-03-02 ENCOUNTER — Other Ambulatory Visit: Payer: Self-pay | Admitting: Family Medicine

## 2015-03-02 DIAGNOSIS — I10 Essential (primary) hypertension: Secondary | ICD-10-CM

## 2015-03-03 ENCOUNTER — Ambulatory Visit (INDEPENDENT_AMBULATORY_CARE_PROVIDER_SITE_OTHER): Payer: 59 | Admitting: Family Medicine

## 2015-03-03 ENCOUNTER — Encounter: Payer: Self-pay | Admitting: Family Medicine

## 2015-03-03 VITALS — BP 136/85 | HR 78 | Ht 63.0 in | Wt 200.2 lb

## 2015-03-03 DIAGNOSIS — E785 Hyperlipidemia, unspecified: Secondary | ICD-10-CM | POA: Diagnosis not present

## 2015-03-03 DIAGNOSIS — I10 Essential (primary) hypertension: Secondary | ICD-10-CM | POA: Diagnosis not present

## 2015-03-03 DIAGNOSIS — N946 Dysmenorrhea, unspecified: Secondary | ICD-10-CM

## 2015-03-03 MED ORDER — HYDROCHLOROTHIAZIDE 25 MG PO TABS: 25.0000 mg | ORAL_TABLET | Freq: Every day | ORAL | Status: DC

## 2015-03-03 MED ORDER — METOPROLOL SUCCINATE ER 100 MG PO TB24: 100.0000 mg | ORAL_TABLET | Freq: Every day | ORAL | Status: DC

## 2015-03-03 MED ORDER — IBUPROFEN 800 MG PO TABS: 800.0000 mg | ORAL_TABLET | Freq: Three times a day (TID) | ORAL | Status: DC | PRN

## 2015-03-03 NOTE — Patient Instructions (Signed)

## 2015-03-03 NOTE — Progress Notes (Signed)
Name: Jaclyn Fields   MRN: [id number]    DOB: 1971-04-26   Date:03/03/2015       Progress Note  Subjective  Chief Complaint  Chief Complaint  Patient presents with  . Hypertension    Hypertension This is a chronic problem. The current episode started more than 1 year ago. The problem has been gradually improving since onset. The problem is controlled. Pertinent negatives include no anxiety, blurred vision, chest pain, headaches, malaise/fatigue, neck pain, orthopnea, palpitations, peripheral edema, PND, shortness of breath or sweats. There are no associated agents to hypertension. Past treatments include beta blockers and diuretics. The current treatment provides moderate improvement. There are no compliance problems.  There is no history of angina, kidney disease, CAD/MI, CVA, heart failure, left ventricular hypertrophy, PVD, renovascular disease or retinopathy. There is no history of chronic renal disease.    No problem-specific assessment & plan notes found for this encounter.   Past Medical History  Diagnosis Date  . Hypertension   . PP care - s/p 1C/S (breech, [facility]) 08/24/2011  . Maternal iron deficiency anemia 08/24/2011  . Bartholin's duct cyst     Bartholin duct cyst 2 left last one 2015    Past Surgical History  Procedure Laterality Date  . Wisdom tooth extraction    . Cesarean section    . Tubal ligation    . Dilation and evacuation  09/05/2011    Procedure: DILATATION AND EVACUATION;  Surgeon: Elveria Royals, MD;  Location: [facility];  Service: Gynecology;  Laterality: Bilateral;    Family History  Problem Relation Age of Onset  . Hypertension Mother   . Hypertension Father   . Diabetes Father     Social History   Social History  . Marital Status: Single    Spouse Name: N/A  . Number of Children: N/A  . Years of Education: N/A   Occupational History  . Not on file.   Social History Main Topics  . Smoking status: Never Smoker   . Smokeless tobacco: Not on  file  . Alcohol Use: 0.0 oz/week     Comment: occasionally  . Drug Use: No  . Sexual Activity: Yes    Birth Control/ Protection: Surgical     Comment: tubal ligation   Other Topics Concern  . Not on file   Social History Narrative    No Known Allergies   Review of Systems  Constitutional: Negative for fever, chills, weight loss and malaise/fatigue.  HENT: Negative for ear discharge, ear pain and sore throat.   Eyes: Negative for blurred vision.  Respiratory: Negative for cough, sputum production, shortness of breath and wheezing.   Cardiovascular: Negative for chest pain, palpitations, orthopnea, leg swelling and PND.  Gastrointestinal: Negative for heartburn, nausea, abdominal pain, diarrhea, constipation, blood in stool and melena.  Genitourinary: Negative for dysuria, urgency, frequency and hematuria.  Musculoskeletal: Negative for myalgias, back pain, joint pain and neck pain.  Skin: Negative for rash.  Neurological: Negative for dizziness, tingling, sensory change, focal weakness and headaches.  Endo/Heme/Allergies: Negative for environmental allergies and polydipsia. Does not bruise/bleed easily.  Psychiatric/Behavioral: Negative for depression and suicidal ideas. The patient is not nervous/anxious and does not have insomnia.      Objective  Filed Vitals:   03/03/15 0938  BP: 136/85  Pulse: 78  Height: 5\' 3"  (1.6 m)  Weight: 200 lb 3.2 oz (90.81 kg)    Physical Exam    Assessment & Plan  Problem List Items Addressed This  Visit    None    Visit Diagnoses    Essential hypertension    -  Primary    Relevant Medications    hydrochlorothiazide (HYDRODIURIL) 25 MG tablet    metoprolol succinate (TOPROL-XL) 100 MG 24 hr tablet    Other Relevant Orders    Renal Function Panel    Dysmenorrhea        Relevant Medications    ibuprofen (ADVIL,MOTRIN) 800 MG tablet    Hyperlipidemia        Relevant Medications    hydrochlorothiazide (HYDRODIURIL) 25 MG tablet     metoprolol succinate (TOPROL-XL) 100 MG 24 hr tablet    Other Relevant Orders    Lipid Profile         Dr. [facility]  03/03/2015

## 2015-03-04 LAB — RENAL FUNCTION PANEL
ALBUMIN: 4.3 g/dL (ref 3.5–5.5)
BUN/Creatinine Ratio: 22 (ref 9–23)
BUN: 17 mg/dL (ref 6–24)
CALCIUM: 9.4 mg/dL (ref 8.7–10.2)
CHLORIDE: 96 mmol/L — AB (ref 97–108)
CO2: 27 mmol/L (ref 18–29)
CREATININE: 0.78 mg/dL (ref 0.57–1.00)
GFR calc Af Amer: 108 mL/min/{1.73_m2} (ref >59)
GFR calc non Af Amer: 93 mL/min/{1.73_m2} (ref >59)
Glucose: 83 mg/dL (ref 65–99)
PHOSPHORUS: 2.9 mg/dL (ref 2.5–4.5)
Potassium: 4.1 mmol/L (ref 3.5–5.2)
Sodium: 139 mmol/L (ref 134–144)

## 2015-03-04 LAB — LIPID PANEL
CHOLESTEROL TOTAL: 210 mg/dL — AB (ref 100–199)
Chol/HDL Ratio: 3.4 ratio units (ref 0.0–4.4)
HDL: 62 mg/dL (ref >39)
LDL Calculated: 129 mg/dL — ABNORMAL HIGH (ref 0–99)
TRIGLYCERIDES: 97 mg/dL (ref 0–149)
VLDL CHOLESTEROL CAL: 19 mg/dL (ref 5–40)

## 2015-05-04 ENCOUNTER — Ambulatory Visit (INDEPENDENT_AMBULATORY_CARE_PROVIDER_SITE_OTHER): Payer: 59 | Admitting: Family Medicine

## 2015-05-04 ENCOUNTER — Encounter: Payer: Self-pay | Admitting: Family Medicine

## 2015-05-04 VITALS — BP 120/66 | HR 64 | Ht 63.0 in | Wt 198.0 lb

## 2015-05-04 DIAGNOSIS — J219 Acute bronchiolitis, unspecified: Secondary | ICD-10-CM

## 2015-05-04 DIAGNOSIS — J0100 Acute maxillary sinusitis, unspecified: Secondary | ICD-10-CM | POA: Diagnosis not present

## 2015-05-04 MED ORDER — FLUCONAZOLE 150 MG PO TABS: 150.0000 mg | ORAL_TABLET | Freq: Once | ORAL | Status: DC

## 2015-05-04 MED ORDER — GUAIFENESIN-CODEINE 100-10 MG/5ML PO SOLN: 5.0000 mL | Freq: Three times a day (TID) | ORAL | Status: DC | PRN

## 2015-05-04 MED ORDER — AZITHROMYCIN 250 MG PO TABS: ORAL_TABLET | ORAL | Status: DC

## 2015-05-04 NOTE — Progress Notes (Signed)
Name: Jaclyn Fields   MRN: [id number]    DOB: [birth year]   Date:05/05/2015       Progress Note  Subjective  Chief Complaint  Chief Complaint  Patient presents with  . Sinusitis    cough, scratchy throat    Sinusitis This is a new problem. The current episode started in the past 7 days. The problem has been waxing and waning since onset. The maximum temperature recorded prior to her arrival was 100.4 - 100.9 F. The pain is mild. Associated symptoms include chills, congestion, coughing, headaches, a hoarse voice, sinus pressure, sneezing and a sore throat. Pertinent negatives include no diaphoresis, ear pain, neck pain or shortness of breath. Past treatments include acetaminophen and oral decongestants. The treatment provided no relief.  Cough This is a chronic problem. The current episode started in the past 7 days. The problem has been gradually worsening. The cough is non-productive. Associated symptoms include chills, ear congestion, a fever, headaches, nasal congestion and a sore throat. Pertinent negatives include no chest pain, ear pain, heartburn, hemoptysis, myalgias, postnasal drip, rash, rhinorrhea, shortness of breath, sweats, weight loss or wheezing. The symptoms are aggravated by pollens. The treatment provided no relief. There is no history of environmental allergies.    No problem-specific assessment & plan notes found for this encounter.   Past Medical History  Diagnosis Date  . Hypertension   . PP care - s/p 1C/S (breech, [facility]) 08/24/2011  . Maternal iron deficiency anemia 08/24/2011  . Bartholin's duct cyst     Bartholin duct cyst 2 left last one 2015    Past Surgical History  Procedure Laterality Date  . Wisdom tooth extraction    . Cesarean section    . Tubal ligation    . Dilation and evacuation  09/05/2011    Procedure: DILATATION AND EVACUATION;  Surgeon: Elveria Royals, MD;  Location: [facility];  Service: Gynecology;  Laterality: Bilateral;    Family History   Problem Relation Age of Onset  . Hypertension Mother   . Hypertension Father   . Diabetes Father     Social History   Social History  . Marital Status: Single    Spouse Name: N/A  . Number of Children: N/A  . Years of Education: N/A   Occupational History  . Not on file.   Social History Main Topics  . Smoking status: Never Smoker   . Smokeless tobacco: Not on file  . Alcohol Use: 0.0 oz/week     Comment: occasionally  . Drug Use: No  . Sexual Activity: Yes    Birth Control/ Protection: Surgical     Comment: tubal ligation   Other Topics Concern  . Not on file   Social History Narrative    No Known Allergies   Review of Systems  Constitutional: Positive for fever and chills. Negative for weight loss, malaise/fatigue and diaphoresis.  HENT: Positive for congestion, hoarse voice, sinus pressure, sneezing and sore throat. Negative for ear discharge, ear pain, postnasal drip and rhinorrhea.   Eyes: Negative for blurred vision.  Respiratory: Positive for cough. Negative for hemoptysis, sputum production, shortness of breath and wheezing.   Cardiovascular: Negative for chest pain, palpitations and leg swelling.  Gastrointestinal: Negative for heartburn, nausea, abdominal pain, diarrhea, constipation, blood in stool and melena.  Genitourinary: Negative for dysuria, urgency, frequency and hematuria.  Musculoskeletal: Negative for myalgias, back pain, joint pain and neck pain.  Skin: Negative for rash.  Neurological: Positive for headaches. Negative for dizziness,  tingling, sensory change and focal weakness.  Endo/Heme/Allergies: Negative for environmental allergies and polydipsia. Does not bruise/bleed easily.  Psychiatric/Behavioral: Negative for depression and suicidal ideas. The patient is not nervous/anxious and does not have insomnia.      Objective  Filed Vitals:   05/04/15 1345  BP: 120/66  Pulse: 64  Height: 5\' 3"  (1.6 m)  Weight: 198 lb (89.812 kg)     Physical Exam  Constitutional: She is well-developed, well-nourished, and in no distress. No distress.  HENT:  Head: Normocephalic and atraumatic.  Right Ear: External ear normal.  Left Ear: External ear normal.  Nose: Nose normal.  Mouth/Throat: Posterior oropharyngeal erythema present.  Eyes: Conjunctivae and EOM are normal. Pupils are equal, round, and reactive to light. Right eye exhibits no discharge. Left eye exhibits no discharge.  Neck: Normal range of motion. Neck supple. No JVD present. No thyromegaly present.  Cardiovascular: Normal rate, regular rhythm, normal heart sounds and intact distal pulses.  Exam reveals no gallop and no friction rub.   No murmur heard. Pulmonary/Chest: Effort normal and breath sounds normal.  Abdominal: Soft. Bowel sounds are normal. She exhibits no mass. There is no tenderness. There is no guarding.  Musculoskeletal: Normal range of motion. She exhibits no edema.  Lymphadenopathy:    She has no cervical adenopathy.  Neurological: She is alert. She has normal reflexes.  Skin: Skin is warm and dry. She is not diaphoretic.  Psychiatric: Mood and affect normal.      Assessment & Plan  Problem List Items Addressed This Visit    None    Visit Diagnoses    Acute maxillary sinusitis, recurrence not specified    -  Primary    Relevant Medications    azithromycin (ZITHROMAX) 250 MG tablet    fluconazole (DIFLUCAN) 150 MG tablet    guaiFENesin-codeine 100-10 MG/5ML syrup    Bronchiolitis        Relevant Medications    azithromycin (ZITHROMAX) 250 MG tablet    guaiFENesin-codeine 100-10 MG/5ML syrup         Dr. Deanna Jones [facility]  05/05/2015

## 2015-05-30 ENCOUNTER — Other Ambulatory Visit: Payer: Self-pay | Admitting: Family Medicine

## 2015-06-30 ENCOUNTER — Ambulatory Visit (INDEPENDENT_AMBULATORY_CARE_PROVIDER_SITE_OTHER): Payer: 59 | Admitting: Family Medicine

## 2015-06-30 ENCOUNTER — Encounter: Payer: Self-pay | Admitting: Family Medicine

## 2015-06-30 VITALS — BP 122/90 | HR 70 | Wt 203.0 lb

## 2015-06-30 DIAGNOSIS — J0100 Acute maxillary sinusitis, unspecified: Secondary | ICD-10-CM | POA: Diagnosis not present

## 2015-06-30 MED ORDER — AMOXICILLIN 500 MG PO CAPS: 500.0000 mg | ORAL_CAPSULE | Freq: Three times a day (TID) | ORAL | Status: DC

## 2015-06-30 NOTE — Progress Notes (Signed)
Name: Jaclyn Fields   MRN: [id number]    DOB: 09-Aug-1970   Date:06/30/2015       Progress Note  Subjective  Chief Complaint  Chief Complaint  Patient presents with  . Headache    pressure on R) side going down R) side of neck- fingers and feet swollen    Headache  This is a new problem. The current episode started in the past 7 days. The problem has been waxing and waning. The pain is located in the frontal region. The pain does not radiate. The quality of the pain is described as aching. The pain is moderate. Associated symptoms include sinus pressure. Pertinent negatives include no abdominal pain, abnormal behavior, anorexia, back pain, blurred vision, coughing, dizziness, drainage, ear pain, eye pain, eye redness, eye watering, facial sweating, fever, hearing loss, insomnia, loss of balance, muscle aches, nausea, neck pain, numbness, phonophobia, photophobia, rhinorrhea, scalp tenderness, seizures, sore throat, swollen glands, tingling, tinnitus, visual change, vomiting, weakness or weight loss. She has tried acetaminophen for the symptoms. The treatment provided mild relief. Her past medical history is significant for hypertension.    No problem-specific assessment & plan notes found for this encounter.   Past Medical History  Diagnosis Date  . Hypertension   . PP care - s/p 1C/S (breech, [facility]) 08/24/2011  . Maternal iron deficiency anemia 08/24/2011  . Bartholin's duct cyst     Bartholin duct cyst 2 left last one 2015    Past Surgical History  Procedure Laterality Date  . Wisdom tooth extraction    . Cesarean section    . Tubal ligation    . Dilation and evacuation  09/05/2011    Procedure: DILATATION AND EVACUATION;  Surgeon: Elveria Royals, MD;  Location: [facility];  Service: Gynecology;  Laterality: Bilateral;    Family History  Problem Relation Age of Onset  . Hypertension Mother   . Hypertension Father   . Diabetes Father     Social History   Social History  .  Marital Status: Single    Spouse Name: N/A  . Number of Children: N/A  . Years of Education: N/A   Occupational History  . Not on file.   Social History Main Topics  . Smoking status: Never Smoker   . Smokeless tobacco: Not on file  . Alcohol Use: 0.0 oz/week     Comment: occasionally  . Drug Use: No  . Sexual Activity: Yes    Birth Control/ Protection: Surgical     Comment: tubal ligation   Other Topics Concern  . Not on file   Social History Narrative    No Known Allergies   Review of Systems  Constitutional: Negative for fever, chills, weight loss and malaise/fatigue.  HENT: Positive for sinus pressure. Negative for ear discharge, ear pain, hearing loss, rhinorrhea, sore throat and tinnitus.   Eyes: Negative for blurred vision, photophobia, pain and redness.  Respiratory: Negative for cough, sputum production, shortness of breath and wheezing.   Cardiovascular: Negative for chest pain, palpitations and leg swelling.  Gastrointestinal: Negative for heartburn, nausea, vomiting, abdominal pain, diarrhea, constipation, blood in stool, melena and anorexia.  Genitourinary: Negative for dysuria, urgency, frequency and hematuria.  Musculoskeletal: Negative for myalgias, back pain, joint pain and neck pain.  Skin: Negative for rash.  Neurological: Positive for headaches. Negative for dizziness, tingling, sensory change, focal weakness, seizures, weakness, numbness and loss of balance.  Endo/Heme/Allergies: Negative for environmental allergies and polydipsia. Does not bruise/bleed easily.  Psychiatric/Behavioral: Negative  for depression and suicidal ideas. The patient is not nervous/anxious and does not have insomnia.      Objective  Filed Vitals:   06/30/15 1057  BP: 122/90  Pulse: 70  Weight: 203 lb (92.08 kg)    Physical Exam  Constitutional: She is oriented to person, place, and time and well-developed, well-nourished, and in no distress. No distress.  HENT:  Head:  Normocephalic and atraumatic.  Right Ear: External ear normal.  Left Ear: External ear normal.  Nose: Nose normal.  Mouth/Throat: Oropharynx is clear and moist.  Eyes: Conjunctivae and EOM are normal. Pupils are equal, round, and reactive to light. Right eye exhibits no discharge. Left eye exhibits no discharge.  Neck: Normal range of motion. Neck supple. No JVD present. No thyromegaly present.  Cardiovascular: Normal rate, regular rhythm, normal heart sounds and intact distal pulses.  Exam reveals no gallop and no friction rub.   No murmur heard. Pulmonary/Chest: Effort normal and breath sounds normal.  Abdominal: Soft. Bowel sounds are normal. She exhibits no mass. There is no tenderness. There is no guarding.  Musculoskeletal: Normal range of motion. She exhibits no edema.  Lymphadenopathy:    She has no cervical adenopathy.  Neurological: She is alert and oriented to person, place, and time. She has normal reflexes. No cranial nerve deficit. Coordination normal.  Skin: Skin is warm and dry. She is not diaphoretic.  Psychiatric: Mood and affect normal.  Nursing note and vitals reviewed.     Assessment & Plan  Problem List Items Addressed This Visit    None    Visit Diagnoses    Acute maxillary sinusitis, recurrence not specified    -  Primary    Relevant Medications    amoxicillin (AMOXIL) 500 MG capsule         Dr. Deanna Jones [facility]  06/30/2015

## 2015-07-14 ENCOUNTER — Encounter: Payer: Self-pay | Admitting: Family Medicine

## 2015-07-14 ENCOUNTER — Ambulatory Visit (INDEPENDENT_AMBULATORY_CARE_PROVIDER_SITE_OTHER): Payer: 59 | Admitting: Family Medicine

## 2015-07-14 VITALS — BP 120/84 | HR 70 | Ht 63.0 in | Wt 206.0 lb

## 2015-07-14 DIAGNOSIS — I10 Essential (primary) hypertension: Secondary | ICD-10-CM

## 2015-07-14 MED ORDER — METOPROLOL SUCCINATE ER 100 MG PO TB24: 100.0000 mg | ORAL_TABLET | Freq: Every day | ORAL | Status: DC

## 2015-07-14 MED ORDER — HYDROCHLOROTHIAZIDE 25 MG PO TABS: 25.0000 mg | ORAL_TABLET | Freq: Every day | ORAL | Status: DC

## 2015-07-14 NOTE — Progress Notes (Signed)
Name: Jaclyn Fields   MRN: [id number]    DOB: 31-Jan-1971   Date:07/14/2015       Progress Note  Subjective  Chief Complaint  Chief Complaint  Patient presents with  . Hypertension    Hypertension This is a chronic problem. The problem has been gradually improving since onset. The problem is controlled. Pertinent negatives include no anxiety, blurred vision, chest pain, headaches, malaise/fatigue, neck pain, orthopnea, palpitations, peripheral edema, PND, shortness of breath or sweats. There are no associated agents to hypertension. Past treatments include beta blockers and diuretics. The current treatment provides mild improvement. There are no compliance problems.  There is no history of angina, kidney disease, CAD/MI, CVA, heart failure, left ventricular hypertrophy, PVD, renovascular disease or retinopathy. There is no history of chronic renal disease or a hypertension causing med.    No problem-specific assessment & plan notes found for this encounter.   Past Medical History  Diagnosis Date  . Hypertension   . PP care - s/p 1C/S (breech, [facility]) 08/24/2011  . Maternal iron deficiency anemia 08/24/2011  . Bartholin's duct cyst     Bartholin duct cyst 2 left last one 2015    Past Surgical History  Procedure Laterality Date  . Wisdom tooth extraction    . Cesarean section    . Tubal ligation    . Dilation and evacuation  09/05/2011    Procedure: DILATATION AND EVACUATION;  Surgeon: Elveria Royals, MD;  Location: [facility];  Service: Gynecology;  Laterality: Bilateral;    Family History  Problem Relation Age of Onset  . Hypertension Mother   . Hypertension Father   . Diabetes Father     Social History   Social History  . Marital Status: Single    Spouse Name: N/A  . Number of Children: N/A  . Years of Education: N/A   Occupational History  . Not on file.   Social History Main Topics  . Smoking status: Never Smoker   . Smokeless tobacco: Not on file  . Alcohol Use:  0.0 oz/week     Comment: occasionally  . Drug Use: No  . Sexual Activity: Yes    Birth Control/ Protection: Surgical     Comment: tubal ligation   Other Topics Concern  . Not on file   Social History Narrative    No Known Allergies   Review of Systems  Constitutional: Negative for malaise/fatigue.  Eyes: Negative for blurred vision.  Respiratory: Negative for shortness of breath.   Cardiovascular: Negative for chest pain, palpitations, orthopnea and PND.  Musculoskeletal: Negative for neck pain.  Neurological: Negative for headaches.     Objective  Filed Vitals:   07/14/15 1042  BP: 120/84  Pulse: 70  Height: 5\' 3"  (1.6 m)  Weight: 206 lb (93.441 kg)    Physical Exam  Constitutional: She is well-developed, well-nourished, and in no distress. No distress.  HENT:  Head: Normocephalic and atraumatic.  Right Ear: External ear normal.  Left Ear: External ear normal.  Nose: Nose normal.  Mouth/Throat: Oropharynx is clear and moist.  Eyes: Conjunctivae and EOM are normal. Pupils are equal, round, and reactive to light. Right eye exhibits no discharge. Left eye exhibits no discharge.  Neck: Normal range of motion. Neck supple. No JVD present. No thyromegaly present.  Cardiovascular: Normal rate, regular rhythm, normal heart sounds and intact distal pulses.  Exam reveals no gallop and no friction rub.   No murmur heard. Pulmonary/Chest: Effort normal and breath sounds normal.  Abdominal: Soft. Bowel sounds are normal. She exhibits no mass. There is no tenderness. There is no guarding.  Musculoskeletal: Normal range of motion. She exhibits no edema.  Lymphadenopathy:    She has no cervical adenopathy.  Neurological: She is alert. She has normal reflexes.  Skin: Skin is warm and dry. She is not diaphoretic.  Psychiatric: Mood and affect normal.  Nursing note and vitals reviewed.     Assessment & Plan  Problem List Items Addressed This Visit    None    Visit  Diagnoses    Essential hypertension    -  Primary    Relevant Medications    hydrochlorothiazide (HYDRODIURIL) 25 MG tablet    metoprolol succinate (TOPROL-XL) 100 MG 24 hr tablet         Dr. [facility]  07/14/2015

## 2015-07-20 ENCOUNTER — Other Ambulatory Visit: Payer: Self-pay

## 2015-07-20 DIAGNOSIS — B379 Candidiasis, unspecified: Secondary | ICD-10-CM

## 2015-07-20 MED ORDER — FLUCONAZOLE 150 MG PO TABS: 150.0000 mg | ORAL_TABLET | Freq: Once | ORAL | Status: DC

## 2015-08-27 ENCOUNTER — Encounter (HOSPITAL_COMMUNITY): Payer: Self-pay | Admitting: *Deleted

## 2015-08-27 ENCOUNTER — Emergency Department (HOSPITAL_COMMUNITY): Payer: 59

## 2015-08-27 ENCOUNTER — Emergency Department (HOSPITAL_COMMUNITY)
Admission: EM | Admit: 2015-08-27 | Discharge: 2015-08-27 | Disposition: A | Discharge status: Home / Self Care | Payer: 59 | Attending: Emergency Medicine | Admitting: Emergency Medicine

## 2015-08-27 DIAGNOSIS — S0993XA Unspecified injury of face, initial encounter: Secondary | ICD-10-CM | POA: Insufficient documentation

## 2015-08-27 DIAGNOSIS — Y998 Other external cause status: Secondary | ICD-10-CM | POA: Insufficient documentation

## 2015-08-27 DIAGNOSIS — S060X1A Concussion with loss of consciousness of 30 minutes or less, initial encounter: Secondary | ICD-10-CM

## 2015-08-27 DIAGNOSIS — S0083XA Contusion of other part of head, initial encounter: Secondary | ICD-10-CM | POA: Diagnosis not present

## 2015-08-27 DIAGNOSIS — S0990XA Unspecified injury of head, initial encounter: Secondary | ICD-10-CM | POA: Diagnosis present

## 2015-08-27 DIAGNOSIS — Z79899 Other long term (current) drug therapy: Secondary | ICD-10-CM | POA: Insufficient documentation

## 2015-08-27 DIAGNOSIS — Y9321 Activity, ice skating: Secondary | ICD-10-CM | POA: Diagnosis not present

## 2015-08-27 DIAGNOSIS — Z7951 Long term (current) use of inhaled steroids: Secondary | ICD-10-CM | POA: Diagnosis not present

## 2015-08-27 DIAGNOSIS — I10 Essential (primary) hypertension: Secondary | ICD-10-CM | POA: Diagnosis not present

## 2015-08-27 DIAGNOSIS — Y9289 Other specified places as the place of occurrence of the external cause: Secondary | ICD-10-CM | POA: Insufficient documentation

## 2015-08-27 DIAGNOSIS — Z8742 Personal history of other diseases of the female genital tract: Secondary | ICD-10-CM | POA: Insufficient documentation

## 2015-08-27 MED ORDER — OXYCODONE HCL 5 MG PO TABS
5.0000 mg | ORAL_TABLET | Freq: Once | ORAL | Status: AC
  Administered 2015-08-27: 5 mg via ORAL
  Filled 2015-08-27: qty 1

## 2015-08-27 MED ORDER — IBUPROFEN 800 MG PO TABS
800.0000 mg | ORAL_TABLET | Freq: Once | ORAL | Status: AC
  Administered 2015-08-27: 800 mg via ORAL
  Filled 2015-08-27: qty 1

## 2015-08-27 MED ORDER — ACETAMINOPHEN 500 MG PO TABS
1000.0000 mg | ORAL_TABLET | Freq: Once | ORAL | Status: AC
  Administered 2015-08-27: 1000 mg via ORAL
  Filled 2015-08-27: qty 2

## 2015-08-27 MED ORDER — ONDANSETRON 4 MG PO TBDP
4.0000 mg | ORAL_TABLET | Freq: Once | ORAL | Status: AC
  Administered 2015-08-27: 4 mg via ORAL
  Filled 2015-08-27: qty 1

## 2015-08-27 MED ORDER — ONDANSETRON 8 MG PO TBDP
8.0000 mg | ORAL_TABLET | Freq: Once | ORAL | Status: AC
  Administered 2015-08-27: 8 mg via ORAL
  Filled 2015-08-27: qty 1

## 2015-08-27 NOTE — ED Notes (Signed)
MD at bedside. 

## 2015-08-27 NOTE — Discharge Instructions (Signed)
Concussion, Adult  A concussion, or closed-head injury, is a brain injury caused by a direct blow to the head or by a quick and sudden movement (jolt) of the head or neck. Concussions are usually not life-threatening. Even so, the effects of a concussion can be serious. If you have had a concussion before, you are more likely to experience concussion-like symptoms after a direct blow to the head.   CAUSES  · Direct blow to the head, such as from running into another player during a soccer game, being hit in a fight, or hitting your head on a hard surface.  · A jolt of the head or neck that causes the brain to move back and forth inside the skull, such as in a car crash.  SIGNS AND SYMPTOMS  The signs of a concussion can be hard to notice. Early on, they may be missed by you, family members, and health care providers. You may look fine but act or feel differently.  Symptoms are usually temporary, but they may last for days, weeks, or even longer. Some symptoms may appear right away while others may not show up for hours or days. Every head injury is different. Symptoms include:  · Mild to moderate headaches that will not go away.  · A feeling of pressure inside your head.  · Having more trouble than usual:    Learning or remembering things you have heard.    Answering questions.    Paying attention or concentrating.    Organizing daily tasks.    Making decisions and solving problems.  · Slowness in thinking, acting or reacting, speaking, or reading.  · Getting lost or being easily confused.  · Feeling tired all the time or lacking energy (fatigued).  · Feeling drowsy.  · Sleep disturbances.    Sleeping more than usual.    Sleeping less than usual.    Trouble falling asleep.    Trouble sleeping (insomnia).  · Loss of balance or feeling lightheaded or dizzy.  · Nausea or vomiting.  · Numbness or tingling.  · Increased sensitivity to:    Sounds.    Lights.    Distractions.  · Vision problems or eyes that tire  easily.  · Diminished sense of taste or smell.  · Ringing in the ears.  · Mood changes such as feeling sad or anxious.  · Becoming easily irritated or angry for little or no reason.  · Lack of motivation.  · Seeing or hearing things other people do not see or hear (hallucinations).  DIAGNOSIS  Your health care provider can usually diagnose a concussion based on a description of your injury and symptoms. He or she will ask whether you passed out (lost consciousness) and whether you are having trouble remembering events that happened right before and during your injury.  Your evaluation might include:  · A brain scan to look for signs of injury to the brain. Even if the test shows no injury, you may still have a concussion.  · Blood tests to be sure other problems are not present.  TREATMENT  · Concussions are usually treated in an emergency department, in urgent care, or at a clinic. You may need to stay in the hospital overnight for further treatment.  · Tell your health care provider if you are taking any medicines, including prescription medicines, over-the-counter medicines, and natural remedies. Some medicines, such as blood thinners (anticoagulants) and aspirin, may increase the chance of complications. Also tell your health care   provider whether you have had alcohol or are taking illegal drugs. This information may affect treatment.  · Your health care provider will send you home with important instructions to follow.  · How fast you will recover from a concussion depends on many factors. These factors include how severe your concussion is, what part of your brain was injured, your age, and how healthy you were before the concussion.  · Most people with mild injuries recover fully. Recovery can take time. In general, recovery is slower in older persons. Also, persons who have had a concussion in the past or have other medical problems may find that it takes longer to recover from their current injury.  HOME  CARE INSTRUCTIONS  General Instructions  · Carefully follow the directions your health care provider gave you.  · Only take over-the-counter or prescription medicines for pain, discomfort, or fever as directed by your health care provider.  · Take only those medicines that your health care provider has approved.  · Do not drink alcohol until your health care provider says you are well enough to do so. Alcohol and certain other drugs may slow your recovery and can put you at risk of further injury.  · If it is harder than usual to remember things, write them down.  · If you are easily distracted, try to do one thing at a time. For example, do not try to watch TV while fixing dinner.  · Talk with family members or close friends when making important decisions.  · Keep all follow-up appointments. Repeated evaluation of your symptoms is recommended for your recovery.  · Watch your symptoms and tell others to do the same. Complications sometimes occur after a concussion. Older adults with a brain injury may have a higher risk of serious complications, such as a blood clot on the brain.  · Tell your teachers, school nurse, school counselor, coach, athletic trainer, or work manager about your injury, symptoms, and restrictions. Tell them about what you can or cannot do. They should watch for:    Increased problems with attention or concentration.    Increased difficulty remembering or learning new information.    Increased time needed to complete tasks or assignments.    Increased irritability or decreased ability to cope with stress.    Increased symptoms.  · Rest. Rest helps the brain to heal. Make sure you:    Get plenty of sleep at night. Avoid staying up late at night.    Keep the same bedtime hours on weekends and weekdays.    Rest during the day. Take daytime naps or rest breaks when you feel tired.  · Limit activities that require a lot of thought or concentration. These include:    Doing homework or job-related  work.    Watching TV.    Working on the computer.  · Avoid any situation where there is potential for another head injury (football, hockey, soccer, basketball, martial arts, downhill snow sports and horseback riding). Your condition will get worse every time you experience a concussion. You should avoid these activities until you are evaluated by the appropriate follow-up health care providers.  Returning To Your Regular Activities  You will need to return to your normal activities slowly, not all at once. You must give your body and brain enough time for recovery.  · Do not return to sports or other athletic activities until your health care provider tells you it is safe to do so.  · Ask   your health care provider when you can drive, ride a bicycle, or operate heavy machinery. Your ability to react may be slower after a brain injury. Never do these activities if you are dizzy.  · Ask your health care provider about when you can return to work or school.  Preventing Another Concussion  It is very important to avoid another brain injury, especially before you have recovered. In rare cases, another injury can lead to permanent brain damage, brain swelling, or death. The risk of this is greatest during the first 7-10 days after a head injury. Avoid injuries by:  · Wearing a seat belt when riding in a car.  · Drinking alcohol only in moderation.  · Wearing a helmet when biking, skiing, skateboarding, skating, or doing similar activities.  · Avoiding activities that could lead to a second concussion, such as contact or recreational sports, until your health care provider says it is okay.  · Taking safety measures in your home.    Remove clutter and tripping hazards from floors and stairways.    Use grab bars in bathrooms and handrails by stairs.    Place non-slip mats on floors and in bathtubs.    Improve lighting in dim areas.  SEEK MEDICAL CARE IF:  · You have increased problems paying attention or  concentrating.  · You have increased difficulty remembering or learning new information.  · You need more time to complete tasks or assignments than before.  · You have increased irritability or decreased ability to cope with stress.  · You have more symptoms than before.  Seek medical care if you have any of the following symptoms for more than 2 weeks after your injury:  · Lasting (chronic) headaches.  · Dizziness or balance problems.  · Nausea.  · Vision problems.  · Increased sensitivity to noise or light.  · Depression or mood swings.  · Anxiety or irritability.  · Memory problems.  · Difficulty concentrating or paying attention.  · Sleep problems.  · Feeling tired all the time.  SEEK IMMEDIATE MEDICAL CARE IF:  · You have severe or worsening headaches. These may be a sign of a blood clot in the brain.  · You have weakness (even if only in one hand, leg, or part of the face).  · You have numbness.  · You have decreased coordination.  · You vomit repeatedly.  · You have increased sleepiness.  · One pupil is larger than the other.  · You have convulsions.  · You have slurred speech.  · You have increased confusion. This may be a sign of a blood clot in the brain.  · You have increased restlessness, agitation, or irritability.  · You are unable to recognize people or places.  · You have neck pain.  · It is difficult to wake you up.  · You have unusual behavior changes.  · You lose consciousness.  MAKE SURE YOU:  · Understand these instructions.  · Will watch your condition.  · Will get help right away if you are not doing well or get worse.     This information is not intended to replace advice given to you by your health care provider. Make sure you discuss any questions you have with your health care provider.     Document Released: 08/31/2003 Document Revised: 07/01/2014 Document Reviewed: 12/31/2012  Elsevier Interactive Patient Education ©2016 [employer]

## 2015-08-27 NOTE — ED Provider Notes (Signed)
CSN: [id number]     Arrival date & time 08/27/15  1420 History   First MD Initiated Contact with Patient 08/27/15 1546     Chief Complaint  Patient presents with  . Fall  . Emesis     (Consider location/radiation/quality/duration/timing/severity/associated sxs/prior Treatment) Patient is a 45 y.o. female presenting with fall and vomiting. The history is provided by the patient.  Fall This is a new problem. The current episode started 1 to 2 hours ago. The problem occurs constantly. The problem has not changed since onset.Associated symptoms include headaches. Pertinent negatives include no chest pain and no shortness of breath. Nothing aggravates the symptoms. Nothing relieves the symptoms. She has tried nothing for the symptoms. The treatment provided no relief.  Emesis Associated symptoms: headaches   Associated symptoms: no arthralgias, no chills and no myalgias    45 yo F with a chief complaint of a fall. Patient was ice skating and she slipped and fell and struck the back or head. States she lost consciousness for just a couple seconds and then since then has had some nausea and vomiting. Also complaining of a posterior headache. Has a mild hematoma to the back of the head. Denies difficulty with movement denies weakness one side or the other. Denies change in vision. Feels that her teeth don't fit together they normally did. Denies intraoral trauma.  Past Medical History  Diagnosis Date  . Hypertension   . PP care - s/p 1C/S (breech, [facility]) 08/24/2011  . Maternal iron deficiency anemia 08/24/2011  . Bartholin's duct cyst     Bartholin duct cyst 2 left last one 2015   Past Surgical History  Procedure Laterality Date  . Wisdom tooth extraction    . Cesarean section    . Tubal ligation    . Dilation and evacuation  09/05/2011    Procedure: DILATATION AND EVACUATION;  Surgeon: Elveria Royals, MD;  Location: [facility];  Service: Gynecology;  Laterality: Bilateral;   Family History   Problem Relation Age of Onset  . Hypertension Mother   . Hypertension Father   . Diabetes Father    Social History  Substance Use Topics  . Smoking status: Never Smoker   . Smokeless tobacco: None  . Alcohol Use: 0.0 oz/week     Comment: occasionally   OB History    Gravida Para Term Preterm AB TAB SAB Ectopic Multiple Living   2 1 1  1 1    1      Review of Systems  Constitutional: Negative for fever and chills.  HENT: Negative for congestion and rhinorrhea.   Eyes: Negative for redness and visual disturbance.  Respiratory: Negative for shortness of breath and wheezing.   Cardiovascular: Negative for chest pain and palpitations.  Gastrointestinal: Positive for nausea and vomiting.  Genitourinary: Negative for dysuria and urgency.  Musculoskeletal: Negative for myalgias and arthralgias.  Skin: Negative for pallor and wound.  Neurological: Positive for headaches. Negative for dizziness.      Allergies  Review of patient's allergies indicates no known allergies.  Home Medications   Prior to Admission medications   Medication Sig Start Date End Date Taking? Authorizing Provider  cetirizine (ZYRTEC) 10 MG tablet Take 10 mg by mouth daily.    Historical Provider, MD  fluconazole (DIFLUCAN) 150 MG tablet Take 1 tablet (150 mg total) by mouth once. 07/20/15   Juline Patch, MD  fluticasone (FLONASE) 50 MCG/ACT nasal spray Place 2 sprays into both nostrils daily. 02/13/15  Juline Patch, MD  hydrochlorothiazide (HYDRODIURIL) 25 MG tablet Take 1 tablet (25 mg total) by mouth daily. 07/14/15   Juline Patch, MD  ibuprofen (ADVIL,MOTRIN) 800 MG tablet Take 1 tablet (800 mg total) by mouth every 8 (eight) hours as needed. 03/03/15   Juline Patch, MD  metoprolol succinate (TOPROL-XL) 100 MG 24 hr tablet Take 1 tablet (100 mg total) by mouth daily. Take with or immediately following a meal. 07/14/15   Juline Patch, MD   BP 141/85 mmHg  Pulse 71  Temp(Src) 98.3 F (36.8 C) (Oral)   Resp 17  SpO2 99%  LMP 07/30/2015 Physical Exam  Constitutional: She is oriented to person, place, and time. She appears well-developed and well-nourished. No distress.  HENT:  Head: Normocephalic and atraumatic.  Hematoma without erythema to posterior occiput.  Mild TTP to bilateral TMJ.  No malalignment, no ttp about the jaw.  No intraoral trauma.   Eyes: EOM are normal. Pupils are equal, round, and reactive to light.  Neck: Normal range of motion. Neck supple.  Cardiovascular: Normal rate and regular rhythm.  Exam reveals no gallop and no friction rub.   No murmur heard. Pulmonary/Chest: Effort normal. She has no wheezes. She has no rales.  Abdominal: Soft. She exhibits no distension. There is no tenderness.  Musculoskeletal: She exhibits no edema or tenderness.  Neurological: She is alert and oriented to person, place, and time. GCS eye subscore is 4. GCS verbal subscore is 5. GCS motor subscore is 6.  Grossly neuro intact  Skin: Skin is warm and dry. She is not diaphoretic.  Psychiatric: She has a normal mood and affect. Her behavior is normal.  Nursing note and vitals reviewed.   ED Course  Procedures (including critical care time) Labs Review Labs Reviewed - No data to display  Imaging Review Ct Head Wo Contrast  08/27/2015  CLINICAL DATA:  Fall, posterior head injury while skating, loss of consciousness, vomiting EXAM: CT HEAD WITHOUT CONTRAST TECHNIQUE: Contiguous axial images were obtained from the base of the skull through the vertex without intravenous contrast. COMPARISON:  None. FINDINGS: No evidence of parenchymal hemorrhage or extra-axial fluid collection. No mass lesion, mass effect, or midline shift. No CT evidence of acute infarction. Cerebral volume is within normal limits.  No ventriculomegaly. The visualized paranasal sinuses are essentially clear. The mastoid air cells are unopacified. Soft tissue swelling/hematoma overlying the posterior vertex/occipital region  (series 2/ images 18 and 20). No evidence of calvarial fracture. IMPRESSION: Soft tissue swelling/hematoma overlying the posterior vertex/occipital region. No evidence of calvarial fracture. No evidence of acute intracranial abnormality. Electronically Signed   By: Julian Hy M.D.   On: 08/27/2015 16:03   I have personally reviewed and evaluated these images and lab results as part of my medical decision-making.   EKG Interpretation None      MDM   Final diagnoses:  Concussion, with loss of consciousness of 30 minutes or less, initial encounter    45 yo F with a chief complaint of a fall. CT scan of the head was negative. Discharge home. Concussion instructions given.  4:11 PM:  I have discussed the diagnosis/risks/treatment options with the patient and family and believe the pt to be eligible for discharge home to follow-up with PCP. We also discussed returning to the ED immediately if new or worsening sx occur. We discussed the sx which are most concerning (e.g., sudden worsening pain, fever, inability to tolerate by mouth) that necessitate immediate return.  Medications administered to the patient during their visit and any new prescriptions provided to the patient are listed below.  Medications given during this visit Medications  acetaminophen (TYLENOL) tablet 1,000 mg (not administered)  ibuprofen (ADVIL,MOTRIN) tablet 800 mg (not administered)  oxyCODONE (Oxy IR/ROXICODONE) immediate release tablet 5 mg (not administered)  ondansetron (ZOFRAN-ODT) disintegrating tablet 4 mg (not administered)  ondansetron (ZOFRAN-ODT) disintegrating tablet 8 mg (8 mg Oral Given 08/27/15 1535)    New Prescriptions   No medications on file    The patient appears reasonably screen and/or stabilized for discharge and I doubt any other medical condition or other [facility] requiring further screening, evaluation, or treatment in the ED at this time prior to discharge.      Deno Etienne, DO 08/27/15  [phone]

## 2015-08-27 NOTE — ED Notes (Signed)
Patient transported to CT 

## 2015-08-27 NOTE — ED Notes (Signed)
Patient was alert, oriented and stable upon discharge. RN went over AVS and patient had no further questions. Pt informed of head injury return precautions.

## 2015-08-27 NOTE — ED Notes (Signed)
Pt reports she was at the skating rink today and fell backwards hitting the back of her head.  +LOC.  Pt vomited x 1 while in the room.  Pt is A&Ox 4 at present.  Pt can remember the whole incident.  Large hematoma noted to back of her head.

## 2015-09-01 ENCOUNTER — Ambulatory Visit (INDEPENDENT_AMBULATORY_CARE_PROVIDER_SITE_OTHER): Payer: 59 | Admitting: Family Medicine

## 2015-09-01 ENCOUNTER — Encounter: Payer: Self-pay | Admitting: Family Medicine

## 2015-09-01 VITALS — BP 120/80 | HR 60 | Ht 63.0 in | Wt 199.0 lb

## 2015-09-01 DIAGNOSIS — I10 Essential (primary) hypertension: Secondary | ICD-10-CM

## 2015-09-01 DIAGNOSIS — J0100 Acute maxillary sinusitis, unspecified: Secondary | ICD-10-CM

## 2015-09-01 MED ORDER — HYDROCHLOROTHIAZIDE 25 MG PO TABS: 25.0000 mg | ORAL_TABLET | Freq: Every day | ORAL | Status: DC

## 2015-09-01 MED ORDER — METOPROLOL SUCCINATE ER 100 MG PO TB24: 100.0000 mg | ORAL_TABLET | Freq: Every day | ORAL | Status: DC

## 2015-09-01 MED ORDER — GUAIFENESIN-CODEINE 100-10 MG/5ML PO SYRP: 5.0000 mL | ORAL_SOLUTION | Freq: Three times a day (TID) | ORAL | Status: DC | PRN

## 2015-09-01 MED ORDER — AMOXICILLIN 500 MG PO CAPS: 500.0000 mg | ORAL_CAPSULE | Freq: Three times a day (TID) | ORAL | Status: DC

## 2015-09-01 NOTE — Progress Notes (Signed)
Name: Jaclyn Fields   MRN: [id number]    DOB: November 24, 1970   Date:09/01/2015       Progress Note  Subjective  Chief Complaint  Chief Complaint  Patient presents with  . Hypertension  . Sinusitis    cough- yellow production and cong, hoarse    Hypertension This is a chronic problem. The current episode started more than 1 year ago. The problem has been gradually improving since onset. The problem is controlled. Associated symptoms include headaches and neck pain. Pertinent negatives include no anxiety, blurred vision, chest pain, malaise/fatigue, orthopnea, palpitations, peripheral edema, PND, shortness of breath or sweats. Past treatments include beta blockers and diuretics. The current treatment provides moderate improvement. Compliance problems include diet.  There is no history of angina, kidney disease, CAD/MI, CVA, heart failure, left ventricular hypertrophy, PVD, renovascular disease or retinopathy. There is no history of chronic renal disease or a hypertension causing med.  Sinusitis This is a new problem. The current episode started in the past 7 days. The problem has been gradually worsening since onset. There has been no fever. The pain is mild. Associated symptoms include congestion, coughing, headaches, a hoarse voice, neck pain, sinus pressure, sneezing and a sore throat. Pertinent negatives include no chills, ear pain or shortness of breath. Past treatments include acetaminophen. The treatment provided mild relief.    No problem-specific assessment & plan notes found for this encounter.   Past Medical History  Diagnosis Date  . Hypertension   . PP care - s/p 1C/S (breech, [facility]) 08/24/2011  . Maternal iron deficiency anemia 08/24/2011  . Bartholin's duct cyst     Bartholin duct cyst 2 left last one 2015    Past Surgical History  Procedure Laterality Date  . Wisdom tooth extraction    . Cesarean section    . Tubal ligation    . Dilation and evacuation  09/05/2011     Procedure: DILATATION AND EVACUATION;  Surgeon: Elveria Royals, MD;  Location: [facility];  Service: Gynecology;  Laterality: Bilateral;    Family History  Problem Relation Age of Onset  . Hypertension Mother   . Hypertension Father   . Diabetes Father     Social History   Social History  . Marital Status: Single    Spouse Name: N/A  . Number of Children: N/A  . Years of Education: N/A   Occupational History  . Not on file.   Social History Main Topics  . Smoking status: Never Smoker   . Smokeless tobacco: Not on file  . Alcohol Use: 0.0 oz/week     Comment: occasionally  . Drug Use: No  . Sexual Activity: Yes    Birth Control/ Protection: Surgical     Comment: tubal ligation   Other Topics Concern  . Not on file   Social History Narrative    No Known Allergies   Review of Systems  Constitutional: Negative for fever, chills, weight loss and malaise/fatigue.  HENT: Positive for congestion, hoarse voice, sinus pressure, sneezing and sore throat. Negative for ear discharge and ear pain.   Eyes: Negative for blurred vision.  Respiratory: Positive for cough. Negative for sputum production, shortness of breath and wheezing.   Cardiovascular: Negative for chest pain, palpitations, orthopnea, leg swelling and PND.  Gastrointestinal: Negative for heartburn, nausea, abdominal pain, diarrhea, constipation, blood in stool and melena.  Genitourinary: Negative for dysuria, urgency, frequency and hematuria.  Musculoskeletal: Positive for neck pain. Negative for myalgias, back pain and joint pain.  Skin: Negative for rash.  Neurological: Positive for headaches. Negative for dizziness, tingling, sensory change and focal weakness.  Endo/Heme/Allergies: Negative for environmental allergies and polydipsia. Does not bruise/bleed easily.  Psychiatric/Behavioral: Negative for depression and suicidal ideas. The patient is not nervous/anxious and does not have insomnia.       Objective  Filed Vitals:   09/01/15 0951  BP: 120/80  Pulse: 60  Height: 5\' 3"  (1.6 m)  Weight: 199 lb (90.266 kg)    Physical Exam  Constitutional: She is well-developed, well-nourished, and in no distress. No distress.  HENT:  Head: Normocephalic and atraumatic.  Right Ear: External ear normal.  Left Ear: External ear normal.  Nose: Nose normal.  Mouth/Throat: Oropharynx is clear and moist.  Eyes: Conjunctivae and EOM are normal. Pupils are equal, round, and reactive to light. Right eye exhibits no discharge. Left eye exhibits no discharge.  Neck: Normal range of motion. Neck supple. No JVD present. No thyromegaly present.  Cardiovascular: Normal rate, regular rhythm, normal heart sounds and intact distal pulses.  Exam reveals no gallop and no friction rub.   No murmur heard. Pulmonary/Chest: Effort normal and breath sounds normal.  Abdominal: Soft. Bowel sounds are normal. She exhibits no mass. There is no tenderness. There is no guarding.  Musculoskeletal: Normal range of motion. She exhibits no edema.  Lymphadenopathy:    She has no cervical adenopathy.  Neurological: She is alert. She has normal reflexes.  Skin: Skin is warm and dry. She is not diaphoretic.  Psychiatric: Mood and affect normal.  Nursing note and vitals reviewed.     Assessment & Plan  Problem List Items Addressed This Visit    None    Visit Diagnoses    Essential hypertension    -  Primary    Relevant Medications    hydrochlorothiazide (HYDRODIURIL) 25 MG tablet    metoprolol succinate (TOPROL-XL) 100 MG 24 hr tablet    Acute maxillary sinusitis, recurrence not specified        Relevant Medications    amoxicillin (AMOXIL) 500 MG capsule    guaiFENesin-codeine (ROBITUSSIN AC) 100-10 MG/5ML syrup         Dr. Tahra Hitzeman [facility]  09/01/2015

## 2015-09-05 ENCOUNTER — Encounter: Payer: Self-pay | Admitting: Family Medicine

## 2015-09-05 ENCOUNTER — Ambulatory Visit (INDEPENDENT_AMBULATORY_CARE_PROVIDER_SITE_OTHER): Payer: 59 | Admitting: Family Medicine

## 2015-09-05 VITALS — BP 128/80 | HR 80

## 2015-09-05 DIAGNOSIS — B3731 Acute candidiasis of vulva and vagina: Secondary | ICD-10-CM

## 2015-09-05 DIAGNOSIS — F0781 Postconcussional syndrome: Secondary | ICD-10-CM | POA: Diagnosis not present

## 2015-09-05 DIAGNOSIS — N760 Acute vaginitis: Secondary | ICD-10-CM

## 2015-09-05 DIAGNOSIS — B373 Candidiasis of vulva and vagina: Secondary | ICD-10-CM | POA: Diagnosis not present

## 2015-09-05 MED ORDER — FLUCONAZOLE 150 MG PO TABS: 150.0000 mg | ORAL_TABLET | Freq: Once | ORAL | Status: DC

## 2015-09-05 NOTE — Progress Notes (Signed)
Name: Jaclyn Fields   MRN: [id number]    DOB: [birth year]   Date:09/05/2015       Progress Note  Subjective  Chief Complaint  Chief Complaint  Patient presents with  . Follow-up    seen in ER on March 5 - Dx with concussion- feels better like "I'm definitely improving"    Other This is a new problem. The current episode started in the past 7 days. The problem occurs daily. The problem has been waxing and waning. Associated symptoms include fatigue, headaches and vertigo. Pertinent negatives include no abdominal pain, chest pain, chills, coughing, fever, myalgias, nausea, neck pain, rash, sore throat, visual change, vomiting or weakness. She has tried acetaminophen for the symptoms. The treatment provided mild relief.    No problem-specific assessment & plan notes found for this encounter.   Past Medical History  Diagnosis Date  . Hypertension   . PP care - s/p 1C/S (breech, [facility]) 08/24/2011  . Maternal iron deficiency anemia 08/24/2011  . Bartholin's duct cyst     Bartholin duct cyst 2 left last one 2015    Past Surgical History  Procedure Laterality Date  . Wisdom tooth extraction    . Cesarean section    . Tubal ligation    . Dilation and evacuation  09/05/2011    Procedure: DILATATION AND EVACUATION;  Surgeon: Elveria Royals, MD;  Location: [facility];  Service: Gynecology;  Laterality: Bilateral;    Family History  Problem Relation Age of Onset  . Hypertension Mother   . Hypertension Father   . Diabetes Father     Social History   Social History  . Marital Status: Single    Spouse Name: N/A  . Number of Children: N/A  . Years of Education: N/A   Occupational History  . Not on file.   Social History Main Topics  . Smoking status: Never Smoker   . Smokeless tobacco: Not on file  . Alcohol Use: 0.0 oz/week     Comment: occasionally  . Drug Use: No  . Sexual Activity: Yes    Birth Control/ Protection: Surgical     Comment: tubal ligation   Other Topics  Concern  . Not on file   Social History Narrative    No Known Allergies   Review of Systems  Constitutional: Positive for fatigue. Negative for fever, chills, weight loss and malaise/fatigue.  HENT: Negative for ear discharge, ear pain and sore throat.   Eyes: Negative for blurred vision.  Respiratory: Negative for cough, sputum production, shortness of breath and wheezing.   Cardiovascular: Negative for chest pain, palpitations and leg swelling.  Gastrointestinal: Negative for heartburn, nausea, vomiting, abdominal pain, diarrhea, constipation, blood in stool and melena.  Genitourinary: Negative for dysuria, urgency, frequency and hematuria.  Musculoskeletal: Negative for myalgias, back pain, joint pain and neck pain.  Skin: Negative for rash.  Neurological: Positive for vertigo and headaches. Negative for dizziness, tingling, sensory change, focal weakness and weakness.  Endo/Heme/Allergies: Negative for environmental allergies and polydipsia. Does not bruise/bleed easily.  Psychiatric/Behavioral: Negative for depression and suicidal ideas. The patient is not nervous/anxious and does not have insomnia.      Objective  Filed Vitals:   09/05/15 1043  BP: 128/80  Pulse: 80    Physical Exam  Constitutional: She is well-developed, well-nourished, and in no distress. No distress.  HENT:  Head: Normocephalic and atraumatic.  Right Ear: External ear normal.  Left Ear: External ear normal.  Nose: Nose normal.  Mouth/Throat: Oropharynx is clear and moist.  Eyes: Conjunctivae and EOM are normal. Pupils are equal, round, and reactive to light. Right eye exhibits no discharge. Left eye exhibits no discharge.  Neck: Normal range of motion. Neck supple. No JVD present. No thyromegaly present.  Cardiovascular: Normal rate, regular rhythm, normal heart sounds and intact distal pulses.  Exam reveals no gallop and no friction rub.   No murmur heard. Pulmonary/Chest: Effort normal and  breath sounds normal.  Abdominal: Soft. Bowel sounds are normal. She exhibits no mass. There is no tenderness. There is no guarding.  Musculoskeletal: Normal range of motion. She exhibits no edema.  Lymphadenopathy:    She has no cervical adenopathy.  Neurological: She is alert. She has normal reflexes.  Skin: Skin is warm and dry. She is not diaphoretic.  Psychiatric: Mood and affect normal.  Nursing note and vitals reviewed.     Assessment & Plan  Problem List Items Addressed This Visit    None    Visit Diagnoses    Post concussion syndrome    -  Primary    Yeast vaginitis        Relevant Medications    fluconazole (DIFLUCAN) 150 MG tablet         Dr. Mac[facility]  09/05/2015

## 2015-09-05 NOTE — Patient Instructions (Signed)
Post-Concussion Syndrome  Post-concussion syndrome describes the symptoms that can occur after a head injury. These symptoms can last from weeks to months.  CAUSES   It is not clear why some head injuries cause post-concussion syndrome. It can occur whether your head injury was mild or severe and whether you were wearing head protection or not.   SIGNS AND SYMPTOMS  · Memory difficulties.  · Dizziness.  · Headaches.  · Double vision or blurry vision.  · Sensitivity to light.  · Hearing difficulties.  · Depression.  · Tiredness.  · Weakness.  · Difficulty with concentration.  · Difficulty sleeping or staying asleep.  · Vomiting.  · Poor balance or instability on your feet.  · Slow reaction time.  · Difficulty learning and remembering things you have heard.  DIAGNOSIS   There is no test to determine whether you have post-concussion syndrome. Your health care provider may order an imaging scan of your brain, such as a CT scan, to check for other problems that may be causing your symptoms (such as a severe injury inside your skull).  TREATMENT   Usually, these problems disappear over time without medical care. Your health care provider may prescribe medicine to help ease your symptoms. It is important to follow up with a neurologist to evaluate your recovery and address any lingering symptoms or issues.  HOME CARE INSTRUCTIONS   · Take medicines only as directed by your health care provider. Do not take aspirin. Aspirin can slow blood clotting.  · Sleep with your head slightly elevated to help with headaches.  · Avoid any situation where there is potential for another head injury. This includes football, hockey, soccer, basketball, martial arts, downhill snow sports, and horseback riding. Your condition will get worse every time you experience a concussion. You should avoid these activities until you are evaluated by the appropriate follow-up health care providers.  · Keep all follow-up visits as directed by your health  care provider. This is important.  SEEK MEDICAL CARE IF:  · You have increased problems paying attention or concentrating.  · You have increased difficulty remembering or learning new information.  · You need more time to complete tasks or assignments than before.  · You have increased irritability or decreased ability to cope with stress.  · You have more symptoms than before.  Seek medical care if you have any of the following symptoms for more than two weeks after your injury:  · Lasting (chronic) headaches.  · Dizziness or balance problems.  · Nausea.  · Vision problems.  · Increased sensitivity to noise or light.  · Depression or mood swings.  · Anxiety or irritability.  · Memory problems.  · Difficulty concentrating or paying attention.  · Sleep problems.  · Feeling tired all the time.  SEEK IMMEDIATE MEDICAL CARE IF:  · You have confusion or unusual drowsiness.  · Others find it difficult to wake you up.  · You have nausea or persistent, forceful vomiting.  · You feel like you are moving when you are not (vertigo). Your eyes may move rapidly back and forth.  · You have convulsions or faint.  · You have severe, persistent headaches that are not relieved by medicine.  · You cannot use your arms or legs normally.  · One of your pupils is larger than the other.  · You have clear or bloody discharge from your nose or ears.  · Your problems are getting worse, not better.  MAKE   SURE YOU:  · Understand these instructions.  · Will watch your condition.  · Will get help right away if you are not doing well or get worse.     This information is not intended to replace advice given to you by your health care provider. Make sure you discuss any questions you have with your health care provider.     Document Released: 11/30/2001 Document Revised: 07/01/2014 Document Reviewed: 09/15/2013  Elsevier Interactive Patient Education ©2016 [employer]

## 2015-09-08 ENCOUNTER — Other Ambulatory Visit: Payer: Self-pay | Admitting: Family Medicine

## 2015-09-14 ENCOUNTER — Ambulatory Visit (INDEPENDENT_AMBULATORY_CARE_PROVIDER_SITE_OTHER): Payer: 59 | Admitting: Family Medicine

## 2015-09-14 ENCOUNTER — Encounter: Payer: Self-pay | Admitting: Family Medicine

## 2015-09-14 VITALS — BP 120/70 | HR 70 | Ht 63.0 in | Wt 199.0 lb

## 2015-09-14 DIAGNOSIS — H209 Unspecified iridocyclitis: Secondary | ICD-10-CM

## 2015-09-14 NOTE — Progress Notes (Signed)
Name: Jaclyn Fields   MRN: [id number]    DOB: 1970-11-28   Date:09/14/2015       Progress Note  Subjective  Chief Complaint  Chief Complaint  Patient presents with  . Conjunctivitis    red and burning R) eye    Conjunctivitis  The current episode started 3 to 5 days ago. The problem occurs frequently. The problem has been gradually worsening. The problem is mild. Nothing relieves the symptoms. Nothing aggravates the symptoms. Associated symptoms include decreased vision, eye itching, photophobia, congestion, eye discharge, eye pain and eye redness. Pertinent negatives include no orthopnea, no fever, no double vision, no abdominal pain, no constipation, no diarrhea, no nausea, no ear discharge, no ear pain, no headaches, no hearing loss, no mouth sores, no rhinorrhea, no sore throat, no stridor, no swollen glands, no neck pain, no cough, no wheezing and no rash. The eye pain is moderate. The right eye is affected.    No problem-specific assessment & plan notes found for this encounter.   Past Medical History  Diagnosis Date  . Hypertension   . PP care - s/p 1C/S (breech, [facility]) 08/24/2011  . Maternal iron deficiency anemia 08/24/2011  . Bartholin's duct cyst     Bartholin duct cyst 2 left last one 2015    Past Surgical History  Procedure Laterality Date  . Wisdom tooth extraction    . Cesarean section    . Tubal ligation    . Dilation and evacuation  09/05/2011    Procedure: DILATATION AND EVACUATION;  Surgeon: Elveria Royals, MD;  Location: [facility];  Service: Gynecology;  Laterality: Bilateral;    Family History  Problem Relation Age of Onset  . Hypertension Mother   . Hypertension Father   . Diabetes Father     Social History   Social History  . Marital Status: Single    Spouse Name: N/A  . Number of Children: N/A  . Years of Education: N/A   Occupational History  . Not on file.   Social History Main Topics  . Smoking status: Never Smoker   . Smokeless tobacco:  Not on file  . Alcohol Use: 0.0 oz/week     Comment: occasionally  . Drug Use: No  . Sexual Activity: Yes    Birth Control/ Protection: Surgical     Comment: tubal ligation   Other Topics Concern  . Not on file   Social History Narrative    No Known Allergies   Review of Systems  Constitutional: Negative for fever, chills, weight loss and malaise/fatigue.  HENT: Positive for congestion. Negative for ear discharge, ear pain, hearing loss, mouth sores, rhinorrhea and sore throat.   Eyes: Positive for blurred vision, photophobia, pain, discharge, redness and itching. Negative for double vision.  Respiratory: Negative for cough, sputum production, shortness of breath, wheezing and stridor.   Cardiovascular: Negative for chest pain, palpitations, orthopnea and leg swelling.  Gastrointestinal: Negative for heartburn, nausea, abdominal pain, diarrhea, constipation, blood in stool and melena.  Genitourinary: Negative for dysuria, urgency, frequency and hematuria.  Musculoskeletal: Negative for myalgias, back pain, joint pain and neck pain.  Skin: Negative for rash.  Neurological: Negative for dizziness, tingling, sensory change, focal weakness and headaches.  Endo/Heme/Allergies: Negative for environmental allergies and polydipsia. Does not bruise/bleed easily.  Psychiatric/Behavioral: Negative for depression and suicidal ideas. The patient is not nervous/anxious and does not have insomnia.      Objective  Filed Vitals:   09/14/15 1337  BP: 120/70  Pulse: 70  Height: 5\' 3"  (1.6 m)  Weight: 199 lb (90.266 kg)    Physical Exam  Constitutional: She is well-developed, well-nourished, and in no distress. No distress.  HENT:  Head: Normocephalic and atraumatic.  Right Ear: External ear normal.  Left Ear: External ear normal.  Nose: Nose normal.  Mouth/Throat: Oropharynx is clear and moist.  Eyes: EOM and lids are normal. Right eye exhibits no discharge. Left eye exhibits no  discharge. Right conjunctiva is injected. Right conjunctiva has no hemorrhage. Left conjunctiva is not injected. Left conjunctiva has no hemorrhage. Pupils are unequal.  Right pupil miosis/ciliary flush  Neck: Normal range of motion. Neck supple. No JVD present. No thyromegaly present.  Cardiovascular: Normal rate, regular rhythm, normal heart sounds and intact distal pulses.  Exam reveals no gallop and no friction rub.   No murmur heard. Pulmonary/Chest: Effort normal and breath sounds normal.  Abdominal: Soft. Bowel sounds are normal. She exhibits no mass. There is no tenderness. There is no guarding.  Musculoskeletal: Normal range of motion. She exhibits no edema.  Lymphadenopathy:    She has no cervical adenopathy.  Neurological: She is alert. She has normal reflexes.  Skin: Skin is warm and dry. She is not diaphoretic.  Psychiatric: Mood and affect normal.  Nursing note and vitals reviewed.     Assessment & Plan  Problem List Items Addressed This Visit    None    Visit Diagnoses    Anterior uveitis    -  Primary    Relevant Orders    Ambulatory referral to Ophthalmology         Dr. Otilio Miu [facility]  09/14/2015

## 2015-09-22 ENCOUNTER — Ambulatory Visit: Payer: 59 | Admitting: Family Medicine

## 2015-10-03 DIAGNOSIS — H2000 Unspecified acute and subacute iridocyclitis: Secondary | ICD-10-CM | POA: Diagnosis not present

## 2015-10-04 DIAGNOSIS — H4710 Unspecified papilledema: Secondary | ICD-10-CM | POA: Diagnosis not present

## 2015-10-05 ENCOUNTER — Other Ambulatory Visit: Payer: Self-pay | Admitting: Family Medicine

## 2015-10-10 DIAGNOSIS — H2000 Unspecified acute and subacute iridocyclitis: Secondary | ICD-10-CM | POA: Diagnosis not present

## 2015-11-03 DIAGNOSIS — H43811 Vitreous degeneration, right eye: Secondary | ICD-10-CM | POA: Diagnosis not present

## 2015-11-13 ENCOUNTER — Other Ambulatory Visit: Payer: Self-pay | Admitting: Family Medicine

## 2015-11-17 DIAGNOSIS — Z1589 Genetic susceptibility to other disease: Secondary | ICD-10-CM | POA: Diagnosis not present

## 2015-11-17 DIAGNOSIS — H2000 Unspecified acute and subacute iridocyclitis: Secondary | ICD-10-CM | POA: Diagnosis not present

## 2015-12-15 DIAGNOSIS — Z1589 Genetic susceptibility to other disease: Secondary | ICD-10-CM | POA: Diagnosis not present

## 2016-01-19 ENCOUNTER — Other Ambulatory Visit: Payer: Self-pay | Admitting: Family Medicine

## 2016-03-02 DIAGNOSIS — M542 Cervicalgia: Secondary | ICD-10-CM | POA: Diagnosis not present

## 2016-03-07 DIAGNOSIS — M542 Cervicalgia: Secondary | ICD-10-CM | POA: Diagnosis not present

## 2016-03-08 ENCOUNTER — Ambulatory Visit (INDEPENDENT_AMBULATORY_CARE_PROVIDER_SITE_OTHER): Payer: BLUE CROSS/BLUE SHIELD | Admitting: Family Medicine

## 2016-03-08 ENCOUNTER — Encounter: Payer: Self-pay | Admitting: Family Medicine

## 2016-03-08 VITALS — BP 138/72 | HR 80 | Ht 63.0 in | Wt 200.0 lb

## 2016-03-08 DIAGNOSIS — S060X0S Concussion without loss of consciousness, sequela: Secondary | ICD-10-CM

## 2016-03-08 NOTE — Progress Notes (Signed)
Name: Jaclyn Fields   MRN: [id number]    DOB: August 04, 1970   Date:03/08/2016       Progress Note  Subjective  Chief Complaint  Chief Complaint  Patient presents with  . Follow-up    concussion- feeling much better- still has to get up slow and be careful when rolling over in bed, but overall feeling better    Patient follow up after fall and concussion.    No problem-specific Assessment & Plan notes found for this encounter.   Past Medical History:  Diagnosis Date  . Bartholin's duct cyst    Bartholin duct cyst 2 left last one 2015  . Hypertension   . Maternal iron deficiency anemia 08/24/2011  . PP care - s/p 1C/S (breech, [facility]) 08/24/2011    Past Surgical History:  Procedure Laterality Date  . CESAREAN SECTION    . DILATION AND EVACUATION  09/05/2011   Procedure: DILATATION AND EVACUATION;  Surgeon: Elveria Royals, MD;  Location: [facility];  Service: Gynecology;  Laterality: Bilateral;  . TUBAL LIGATION    . WISDOM TOOTH EXTRACTION      Family History  Problem Relation Age of Onset  . Hypertension Mother   . Hypertension Father   . Diabetes Father     Social History   Social History  . Marital status: Single    Spouse name: N/A  . Number of children: N/A  . Years of education: N/A   Occupational History  . Not on file.   Social History Main Topics  . Smoking status: Never Smoker  . Smokeless tobacco: Not on file  . Alcohol use 0.0 oz/week     Comment: occasionally  . Drug use: No  . Sexual activity: Yes    Birth control/ protection: Surgical     Comment: tubal ligation   Other Topics Concern  . Not on file   Social History Narrative  . No narrative on file    No Known Allergies   Review of Systems  Constitutional: Negative for chills, fever, malaise/fatigue and weight loss.  HENT: Negative for ear discharge, ear pain and sore throat.   Eyes: Negative for blurred vision.  Respiratory: Negative for cough, sputum production, shortness of breath  and wheezing.   Cardiovascular: Negative for chest pain, palpitations and leg swelling.  Gastrointestinal: Negative for abdominal pain, blood in stool, constipation, diarrhea, heartburn, melena and nausea.  Genitourinary: Negative for dysuria, frequency, hematuria and urgency.  Musculoskeletal: Negative for back pain, joint pain, myalgias and neck pain.  Skin: Negative for rash.  Neurological: Negative for dizziness, tingling, sensory change, focal weakness and headaches.  Endo/Heme/Allergies: Negative for environmental allergies and polydipsia. Does not bruise/bleed easily.  Psychiatric/Behavioral: Negative for depression and suicidal ideas. The patient is not nervous/anxious and does not have insomnia.      Objective  Vitals:   03/08/16 1413  BP: 138/72  Pulse: 80  Weight: 200 lb (90.7 kg)  Height: 5\' 3"  (1.6 m)    Physical Exam  Constitutional: She is well-developed, well-nourished, and in no distress. No distress.  HENT:  Head: Normocephalic and atraumatic.  Right Ear: External ear normal.  Left Ear: External ear normal.  Nose: Nose normal.  Mouth/Throat: Oropharynx is clear and moist.  Eyes: Conjunctivae and EOM are normal. Pupils are equal, round, and reactive to light. Right eye exhibits no discharge. Left eye exhibits no discharge.  Neck: Normal range of motion. Neck supple. No JVD present. No thyromegaly present.  Cardiovascular: Normal rate, regular  rhythm, normal heart sounds and intact distal pulses.  Exam reveals no gallop and no friction rub.   No murmur heard. Pulmonary/Chest: Effort normal and breath sounds normal.  Abdominal: Soft. Bowel sounds are normal. She exhibits no mass. There is no tenderness. There is no guarding.  Musculoskeletal: Normal range of motion. She exhibits no edema.  Lymphadenopathy:    She has no cervical adenopathy.  Neurological: She is alert. She has normal reflexes.  Skin: Skin is warm and dry. She is not diaphoretic.  Psychiatric:  Mood and affect normal.  Nursing note and vitals reviewed.     Assessment & Plan  Problem List Items Addressed This Visit    None    Visit Diagnoses    Concussion, without loss of consciousness, sequela ([place])    -  Primary        Dr. Otilio Miu [facility]  03/08/16

## 2016-03-21 DIAGNOSIS — M542 Cervicalgia: Secondary | ICD-10-CM | POA: Diagnosis not present

## 2016-04-12 ENCOUNTER — Encounter: Payer: Self-pay | Admitting: Family Medicine

## 2016-04-12 ENCOUNTER — Ambulatory Visit (INDEPENDENT_AMBULATORY_CARE_PROVIDER_SITE_OTHER): Payer: BLUE CROSS/BLUE SHIELD | Admitting: Family Medicine

## 2016-04-12 VITALS — BP 120/72 | HR 64 | Ht 63.0 in | Wt 199.0 lb

## 2016-04-12 DIAGNOSIS — Z23 Encounter for immunization: Secondary | ICD-10-CM

## 2016-04-12 DIAGNOSIS — J302 Other seasonal allergic rhinitis: Secondary | ICD-10-CM

## 2016-04-12 DIAGNOSIS — J0100 Acute maxillary sinusitis, unspecified: Secondary | ICD-10-CM

## 2016-04-12 DIAGNOSIS — E663 Overweight: Secondary | ICD-10-CM | POA: Diagnosis not present

## 2016-04-12 DIAGNOSIS — I10 Essential (primary) hypertension: Secondary | ICD-10-CM

## 2016-04-12 MED ORDER — HYDROCHLOROTHIAZIDE 25 MG PO TABS: 25.0000 mg | ORAL_TABLET | Freq: Every day | ORAL | 1 refills | Status: DC

## 2016-04-12 MED ORDER — CETIRIZINE HCL 10 MG PO TABS: 10.0000 mg | ORAL_TABLET | Freq: Every day | ORAL | 3 refills | Status: DC

## 2016-04-12 MED ORDER — FLUTICASONE PROPIONATE 50 MCG/ACT NA SUSP: 2.0000 | Freq: Every day | NASAL | 12 refills | Status: DC

## 2016-04-12 MED ORDER — METOPROLOL SUCCINATE ER 100 MG PO TB24: 100.0000 mg | ORAL_TABLET | Freq: Every day | ORAL | 1 refills | Status: DC

## 2016-04-12 NOTE — Progress Notes (Signed)
Name: Jaclyn Fields   MRN: [id number]    DOB: 1970/10/29   Date:04/12/2016       Progress Note  Subjective  Chief Complaint  Chief Complaint  Patient presents with  . Hypertension  . Allergic Rhinitis     Hypertension  This is a chronic problem. The current episode started more than 1 year ago. The problem has been waxing and waning since onset. The problem is controlled. Pertinent negatives include no anxiety, blurred vision, chest pain, headaches, malaise/fatigue, neck pain, orthopnea, palpitations, peripheral edema, PND, shortness of breath or sweats. There are no associated agents to hypertension. Past treatments include diuretics and beta blockers. The current treatment provides moderate improvement. There are no compliance problems.  There is no history of angina, kidney disease, CAD/MI, CVA, heart failure, left ventricular hypertrophy, PVD, renovascular disease or retinopathy. There is no history of chronic renal disease or a hypertension causing med.  Sinus Problem  This is a chronic (allergic rhinitis) problem. The current episode started more than 1 year ago. The problem has been waxing and waning since onset. There has been no fever. She is experiencing no pain. Pertinent negatives include no chills, coughing, ear pain, headaches, neck pain, shortness of breath or sore throat. Past treatments include nothing. The treatment provided mild relief.    No problem-specific Assessment & Plan notes found for this encounter.   Past Medical History:  Diagnosis Date  . Bartholin's duct cyst    Bartholin duct cyst 2 left last one 2015  . Hypertension   . Maternal iron deficiency anemia 08/24/2011  . PP care - s/p 1C/S (breech, [facility]) 08/24/2011    Past Surgical History:  Procedure Laterality Date  . CESAREAN SECTION    . DILATION AND EVACUATION  09/05/2011   Procedure: DILATATION AND EVACUATION;  Surgeon: Elveria Royals, MD;  Location: [facility];  Service: Gynecology;  Laterality:  Bilateral;  . TUBAL LIGATION    . WISDOM TOOTH EXTRACTION      Family History  Problem Relation Age of Onset  . Hypertension Mother   . Hypertension Father   . Diabetes Father     Social History   Social History  . Marital status: Single    Spouse name: N/A  . Number of children: N/A  . Years of education: N/A   Occupational History  . Not on file.   Social History Main Topics  . Smoking status: Never Smoker  . Smokeless tobacco: Not on file  . Alcohol use 0.0 oz/week     Comment: occasionally  . Drug use: No  . Sexual activity: Yes    Birth control/ protection: Surgical     Comment: tubal ligation   Other Topics Concern  . Not on file   Social History Narrative  . No narrative on file    No Known Allergies   Review of Systems  Constitutional: Negative for chills, fever, malaise/fatigue and weight loss.  HENT: Negative for ear discharge, ear pain and sore throat.   Eyes: Negative for blurred vision.  Respiratory: Negative for cough, sputum production, shortness of breath and wheezing.   Cardiovascular: Negative for chest pain, palpitations, orthopnea, leg swelling and PND.  Gastrointestinal: Negative for abdominal pain, blood in stool, constipation, diarrhea, heartburn, melena and nausea.  Genitourinary: Negative for dysuria, frequency, hematuria and urgency.  Musculoskeletal: Negative for back pain, joint pain, myalgias and neck pain.  Skin: Negative for rash.  Neurological: Negative for dizziness, tingling, sensory change, focal weakness and headaches.  Endo/Heme/Allergies: Negative for environmental allergies and polydipsia. Does not bruise/bleed easily.  Psychiatric/Behavioral: Negative for depression and suicidal ideas. The patient is not nervous/anxious and does not have insomnia.      Objective  Vitals:   04/12/16 0855  BP: 120/72  Pulse: 64  Weight: 199 lb (90.3 kg)  Height: 5\' 3"  (1.6 m)    Physical Exam  Constitutional: She is  well-developed, well-nourished, and in no distress. No distress.  HENT:  Head: Normocephalic and atraumatic.  Right Ear: External ear normal.  Left Ear: External ear normal.  Nose: Nose normal.  Mouth/Throat: Oropharynx is clear and moist.  Eyes: Conjunctivae and EOM are normal. Pupils are equal, round, and reactive to light. Right eye exhibits no discharge. Left eye exhibits no discharge.  Neck: Normal range of motion. Neck supple. No JVD present. No thyromegaly present.  Cardiovascular: Normal rate, regular rhythm, normal heart sounds and intact distal pulses.  Exam reveals no gallop and no friction rub.   No murmur heard. Pulmonary/Chest: Effort normal and breath sounds normal. She has no wheezes. She has no rales.  Abdominal: Soft. Bowel sounds are normal. She exhibits no mass. There is no tenderness. There is no guarding.  Musculoskeletal: Normal range of motion. She exhibits no edema.  Lymphadenopathy:    She has no cervical adenopathy.  Neurological: She is alert. She has normal reflexes.  Skin: Skin is warm and dry. She is not diaphoretic.  Psychiatric: Mood and affect normal.      Assessment & Plan  Problem List Items Addressed This Visit    None    Visit Diagnoses    Essential hypertension    -  Primary   Relevant Medications   hydrochlorothiazide (HYDRODIURIL) 25 MG tablet   metoprolol succinate (TOPROL-XL) 100 MG 24 hr tablet   Other Relevant Orders   Renal Function Panel   Chronic seasonal allergic rhinitis, unspecified trigger       Relevant Medications   cetirizine (ZYRTEC) 10 MG tablet   Acute maxillary sinusitis, recurrence not specified       Relevant Medications   fluticasone (FLONASE) 50 MCG/ACT nasal spray   cetirizine (ZYRTEC) 10 MG tablet   Immunization due       Relevant Orders   Flu Vaccine QUAD 36+ mos PF IM (Fluarix & Fluzone Quad PF) (Completed)   Overweight       Relevant Orders   Lipid Profile        Dr. Yitzchok Carriger [facility]  04/12/16

## 2016-04-13 LAB — RENAL FUNCTION PANEL
Albumin: 3.9 g/dL (ref 3.5–5.5)
BUN / CREAT RATIO: 23 (ref 9–23)
BUN: 21 mg/dL (ref 6–24)
CALCIUM: 9.3 mg/dL (ref 8.7–10.2)
CO2: 29 mmol/L (ref 18–29)
CREATININE: 0.93 mg/dL (ref 0.57–1.00)
Chloride: 95 mmol/L — ABNORMAL LOW (ref 96–106)
GFR calc Af Amer: 86 mL/min/{1.73_m2} (ref >59)
GFR, EST NON AFRICAN AMERICAN: 74 mL/min/{1.73_m2} (ref >59)
Glucose: 98 mg/dL (ref 65–99)
Phosphorus: 3 mg/dL (ref 2.5–4.5)
Potassium: 3.9 mmol/L (ref 3.5–5.2)
SODIUM: 140 mmol/L (ref 134–144)

## 2016-04-13 LAB — LIPID PANEL
CHOL/HDL RATIO: 3.2 ratio (ref 0.0–4.4)
CHOLESTEROL TOTAL: 189 mg/dL (ref 100–199)
HDL: 59 mg/dL (ref >39)
LDL CALC: 112 mg/dL — AB (ref 0–99)
Triglycerides: 92 mg/dL (ref 0–149)
VLDL Cholesterol Cal: 18 mg/dL (ref 5–40)

## 2016-06-24 HISTORY — PX: COLONOSCOPY: SHX174

## 2016-08-12 DIAGNOSIS — J1089 Influenza due to other identified influenza virus with other manifestations: Secondary | ICD-10-CM | POA: Diagnosis not present

## 2016-09-01 DIAGNOSIS — R05 Cough: Secondary | ICD-10-CM | POA: Diagnosis not present

## 2016-09-01 DIAGNOSIS — J0100 Acute maxillary sinusitis, unspecified: Secondary | ICD-10-CM | POA: Diagnosis not present

## 2016-10-11 ENCOUNTER — Ambulatory Visit (INDEPENDENT_AMBULATORY_CARE_PROVIDER_SITE_OTHER): Payer: BLUE CROSS/BLUE SHIELD | Admitting: Family Medicine

## 2016-10-11 ENCOUNTER — Other Ambulatory Visit: Payer: Self-pay | Admitting: Gynecology

## 2016-10-11 VITALS — BP 108/62 | HR 64 | Ht 63.0 in | Wt 203.0 lb

## 2016-10-11 DIAGNOSIS — J0100 Acute maxillary sinusitis, unspecified: Secondary | ICD-10-CM | POA: Diagnosis not present

## 2016-10-11 DIAGNOSIS — J301 Allergic rhinitis due to pollen: Secondary | ICD-10-CM | POA: Diagnosis not present

## 2016-10-11 DIAGNOSIS — Z1231 Encounter for screening mammogram for malignant neoplasm of breast: Secondary | ICD-10-CM

## 2016-10-11 DIAGNOSIS — I10 Essential (primary) hypertension: Secondary | ICD-10-CM

## 2016-10-11 MED ORDER — HYDROCHLOROTHIAZIDE 25 MG PO TABS: 25.0000 mg | ORAL_TABLET | Freq: Every day | ORAL | 1 refills | Status: DC

## 2016-10-11 MED ORDER — FLUTICASONE PROPIONATE 50 MCG/ACT NA SUSP: 2.0000 | Freq: Every day | NASAL | 12 refills | Status: DC

## 2016-10-11 MED ORDER — CETIRIZINE HCL 10 MG PO TABS: 10.0000 mg | ORAL_TABLET | Freq: Every day | ORAL | 3 refills | Status: DC

## 2016-10-11 MED ORDER — METOPROLOL SUCCINATE ER 100 MG PO TB24: 100.0000 mg | ORAL_TABLET | Freq: Every day | ORAL | 1 refills | Status: DC

## 2016-10-11 NOTE — Progress Notes (Signed)
Name: Jaclyn Fields   MRN: [id number]    DOB: 04-25-1971   Date:10/11/2016       Progress Note  Subjective  Chief Complaint  Chief Complaint  Patient presents with  . Allergic Rhinitis   . Hypertension    Hypertension  This is a chronic problem. The current episode started more than 1 year ago. The problem has been gradually improving since onset. The problem is controlled. Pertinent negatives include no anxiety, blurred vision, chest pain, headaches, malaise/fatigue, neck pain, orthopnea, palpitations, peripheral edema, PND, shortness of breath or sweats. There are no associated agents to hypertension. There are no known risk factors for coronary artery disease. Past treatments include beta blockers and diuretics. The current treatment provides moderate improvement. There are no compliance problems.  There is no history of angina, kidney disease, CAD/MI, CVA, heart failure, left ventricular hypertrophy, PVD or retinopathy. There is no history of chronic renal disease, a hypertension causing med or renovascular disease.  Sinus Problem  This is a chronic problem. The current episode started more than 1 year ago. The problem has been waxing and waning since onset. There has been no fever. Associated symptoms include congestion, sinus pressure and sneezing. Pertinent negatives include no chills, coughing, diaphoresis, ear pain, headaches, hoarse voice, neck pain, shortness of breath, sore throat or swollen glands. Past treatments include nothing. The treatment provided mild relief.    No problem-specific Assessment & Plan notes found for this encounter.   Past Medical History:  Diagnosis Date  . Bartholin's duct cyst    Bartholin duct cyst 2 left last one 2015  . Hypertension   . Maternal iron deficiency anemia 08/24/2011  . PP care - s/p 1C/S (breech, [facility]) 08/24/2011    Past Surgical History:  Procedure Laterality Date  . CESAREAN SECTION    . DILATION AND EVACUATION  09/05/2011   Procedure: DILATATION AND EVACUATION;  Surgeon: Elveria Royals, MD;  Location: [facility];  Service: Gynecology;  Laterality: Bilateral;  . TUBAL LIGATION    . WISDOM TOOTH EXTRACTION      Family History  Problem Relation Age of Onset  . Hypertension Mother   . Hypertension Father   . Diabetes Father     Social History   Social History  . Marital status: Married    Spouse name: N/A  . Number of children: N/A  . Years of education: N/A   Occupational History  . Not on file.   Social History Main Topics  . Smoking status: Never Smoker  . Smokeless tobacco: Not on file  . Alcohol use 0.0 oz/week     Comment: occasionally  . Drug use: No  . Sexual activity: Yes    Birth control/ protection: Surgical     Comment: tubal ligation   Other Topics Concern  . Not on file   Social History Narrative  . No narrative on file    No Known Allergies  Outpatient Medications Prior to Visit  Medication Sig Dispense Refill  . ibuprofen (ADVIL,MOTRIN) 800 MG tablet Take 1 tablet (800 mg total) by mouth every 8 (eight) hours as needed. 30 tablet 5  . cetirizine (ZYRTEC) 10 MG tablet Take 1 tablet (10 mg total) by mouth daily. 90 tablet 3  . fluticasone (FLONASE) 50 MCG/ACT nasal spray Place 2 sprays into both nostrils daily. 16 g 12  . hydrochlorothiazide (HYDRODIURIL) 25 MG tablet Take 1 tablet (25 mg total) by mouth daily. 90 tablet 1  . metoprolol succinate (TOPROL-XL) 100  MG 24 hr tablet Take 1 tablet (100 mg total) by mouth daily. Take with or immediately following a meal. 90 tablet 1   No facility-administered medications prior to visit.     Review of Systems  Constitutional: Negative for chills, diaphoresis, fever, malaise/fatigue and weight loss.  HENT: Positive for congestion, sinus pressure and sneezing. Negative for ear discharge, ear pain, hoarse voice and sore throat.   Eyes: Negative for blurred vision.  Respiratory: Negative for cough, sputum production, shortness of  breath and wheezing.   Cardiovascular: Negative for chest pain, palpitations, orthopnea, leg swelling and PND.  Gastrointestinal: Negative for abdominal pain, blood in stool, constipation, diarrhea, heartburn, melena and nausea.  Genitourinary: Negative for dysuria, frequency, hematuria and urgency.  Musculoskeletal: Negative for back pain, joint pain, myalgias and neck pain.  Skin: Negative for rash.  Neurological: Negative for dizziness, tingling, sensory change, focal weakness and headaches.  Endo/Heme/Allergies: Negative for environmental allergies and polydipsia. Does not bruise/bleed easily.  Psychiatric/Behavioral: Negative for depression and suicidal ideas. The patient is not nervous/anxious and does not have insomnia.      Objective  Vitals:   10/11/16 0923  BP: 108/62  Pulse: 64  Weight: 203 lb (92.1 kg)  Height: 5\' 3"  (1.6 m)    Physical Exam  Constitutional: She is well-developed, well-nourished, and in no distress. No distress.  HENT:  Head: Normocephalic and atraumatic.  Right Ear: External ear normal.  Left Ear: External ear normal.  Nose: Nose normal.  Mouth/Throat: Oropharynx is clear and moist.  Eyes: Conjunctivae and EOM are normal. Pupils are equal, round, and reactive to light. Right eye exhibits no discharge. Left eye exhibits no discharge.  Neck: Normal range of motion. Neck supple. No JVD present. No thyromegaly present.  Cardiovascular: Normal rate, regular rhythm, normal heart sounds and intact distal pulses.  Exam reveals no gallop and no friction rub.   No murmur heard. Pulmonary/Chest: Effort normal and breath sounds normal. She has no wheezes. She has no rales.  Abdominal: Soft. Bowel sounds are normal. She exhibits no mass. There is no tenderness. There is no guarding.  Musculoskeletal: Normal range of motion. She exhibits no edema.  Lymphadenopathy:    She has no cervical adenopathy.  Neurological: She is alert. She has normal reflexes.  Skin:  Skin is warm and dry. She is not diaphoretic.  Psychiatric: Mood and affect normal.  Nursing note and vitals reviewed.     Assessment & Plan  Problem List Items Addressed This Visit    None    Visit Diagnoses    Essential hypertension    -  Primary   Relevant Medications   hydrochlorothiazide (HYDRODIURIL) 25 MG tablet   metoprolol succinate (TOPROL-XL) 100 MG 24 hr tablet   Other Relevant Orders   Renal Function Panel   Seasonal allergic rhinitis due to pollen       Relevant Medications   cetirizine (ZYRTEC) 10 MG tablet   fluticasone (FLONASE) 50 MCG/ACT nasal spray   Acute maxillary sinusitis, recurrence not specified       Relevant Medications   cetirizine (ZYRTEC) 10 MG tablet   fluticasone (FLONASE) 50 MCG/ACT nasal spray      Meds ordered this encounter  Medications  . hydrochlorothiazide (HYDRODIURIL) 25 MG tablet    Sig: Take 1 tablet (25 mg total) by mouth daily.    Dispense:  90 tablet    Refill:  1  . metoprolol succinate (TOPROL-XL) 100 MG 24 hr tablet    Sig:  Take 1 tablet (100 mg total) by mouth daily. Take with or immediately following a meal.    Dispense:  90 tablet    Refill:  1  . cetirizine (ZYRTEC) 10 MG tablet    Sig: Take 1 tablet (10 mg total) by mouth daily.    Dispense:  90 tablet    Refill:  3  . fluticasone (FLONASE) 50 MCG/ACT nasal spray    Sig: Place 2 sprays into both nostrils daily.    Dispense:  16 g    Refill:  12      Dr. Macon [facility]  10/11/16

## 2016-10-12 LAB — RENAL FUNCTION PANEL
Albumin: 4.2 g/dL (ref 3.5–5.5)
BUN / CREAT RATIO: 21 (ref 9–23)
BUN: 15 mg/dL (ref 6–24)
CALCIUM: 9.4 mg/dL (ref 8.7–10.2)
CHLORIDE: 98 mmol/L (ref 96–106)
CO2: 28 mmol/L (ref 18–29)
Creatinine, Ser: 0.7 mg/dL (ref 0.57–1.00)
GFR calc Af Amer: 121 mL/min/{1.73_m2} (ref >59)
GFR calc non Af Amer: 105 mL/min/{1.73_m2} (ref >59)
GLUCOSE: 98 mg/dL (ref 65–99)
Phosphorus: 3.1 mg/dL (ref 2.5–4.5)
Potassium: 3.8 mmol/L (ref 3.5–5.2)
Sodium: 141 mmol/L (ref 134–144)

## 2016-10-24 ENCOUNTER — Ambulatory Visit (INDEPENDENT_AMBULATORY_CARE_PROVIDER_SITE_OTHER): Payer: BLUE CROSS/BLUE SHIELD | Admitting: Gynecology

## 2016-10-24 ENCOUNTER — Other Ambulatory Visit: Payer: Self-pay | Admitting: Family Medicine

## 2016-10-24 ENCOUNTER — Encounter: Payer: Self-pay | Admitting: Gynecology

## 2016-10-24 VITALS — BP 130/82 | Ht 63.0 in | Wt 202.0 lb

## 2016-10-24 DIAGNOSIS — K921 Melena: Secondary | ICD-10-CM | POA: Diagnosis not present

## 2016-10-24 DIAGNOSIS — Z01419 Encounter for gynecological examination (general) (routine) without abnormal findings: Secondary | ICD-10-CM | POA: Diagnosis not present

## 2016-10-24 MED ORDER — LINACLOTIDE 145 MCG PO CAPS: 145.0000 ug | ORAL_CAPSULE | Freq: Every day | ORAL | 11 refills | Status: DC

## 2016-10-24 NOTE — Patient Instructions (Signed)
Linaclotide oral capsules  What is this medicine?  LINACLOTIDE (lin a KLOE tide) is used to treat irritable bowel syndrome (IBS) with constipation as the main problem. It may also be used for relief of chronic constipation.  This medicine may be used for other purposes; ask your health care provider or pharmacist if you have questions.  COMMON BRAND NAME(S): Linzess  What should I tell my health care provider before I take this medicine?  They need to know if you have any of these conditions:  -history of stool (fecal) impaction  -now have diarrhea or have diarrhea often  -other medical condition  -stomach or intestinal disease, including bowel obstruction or abdominal adhesions  -an unusual or allergic reaction to linaclotide, other medicines, foods, dyes, or preservatives  -pregnant or trying to get pregnant  -breast-feeding  How should I use this medicine?  Take this medicine by mouth with a glass of water. Follow the directions on the prescription label. Do not cut, crush or chew this medicine. Take on an empty stomach, at least 30 minutes before your first meal of the day. Take your medicine at regular intervals. Do not take your medicine more often than directed. Do not stop taking except on your doctor's advice.  A special MedGuide will be given to you by the pharmacist with each prescription and refill. Be sure to read this information carefully each time.  Talk to your pediatrician regarding the use of this medicine in children. This medicine is not approved for use in children.  Overdosage: If you think you have taken too much of this medicine contact a poison control center or emergency room at once.  NOTE: This medicine is only for you. Do not share this medicine with others.  What if I miss a dose?  If you miss a dose, just skip that dose. Wait until your next dose, and take only that dose. Do not take double or extra doses.  What may interact with this medicine?  -certain medicines for bowel problems  or bladder incontinence (these can cause constipation)  This list may not describe all possible interactions. Give your health care provider a list of all the medicines, herbs, non-prescription drugs, or dietary supplements you use. Also tell them if you smoke, drink alcohol, or use illegal drugs. Some items may interact with your medicine.  What should I watch for while using this medicine?  Visit your doctor for regular check ups. Tell your doctor if your symptoms do not get better or if they get worse.  Diarrhea is a common side effect of this medicine. It often begins within 2 weeks of starting this medicine. Stop taking this medicine and call your doctor if you get severe diarrhea.  Stop taking this medicine and call your doctor or go to the nearest hospital emergency room right away if you develop unusual or severe stomach-area (abdominal) pain, especially if you also have bright red, bloody stools or black stools that look like tar.  What side effects may I notice from receiving this medicine?  Side effects that you should report to your doctor or health care professional as soon as possible:  -allergic reactions like skin rash, itching or hives, swelling of the face, lips, or tongue  -black, tarry stools  -bloody or watery diarrhea  -new or worsening stomach pain  -severe or prolonged diarrhea  Side effects that usually do not require medical attention (report to your doctor or health care professional if they continue or are   my medicine? Keep out of the reach of children. Store at room temperature between 20 and 25 degrees C (68 and 77 degrees F). Keep this medicine in the original container. Keep tightly closed in a dry place. Do not remove the desiccant packet from the bottle,  it helps to protect your medicine from moisture. Throw away any unused medicine after the expiration date. NOTE: This sheet is a summary. It may not cover all possible information. If you have questions about this medicine, talk to your doctor, pharmacist, or health care provider.  2018 Elsevier/Gold Standard (2015-07-13 12:17:04) Constipation, Adult Constipation is when a person has fewer bowel movements in a week than normal, has difficulty having a bowel movement, or has stools that are dry, hard, or larger than normal. Constipation may be caused by an underlying condition. It may become worse with age if a person takes certain medicines and does not take in enough fluids. Follow these instructions at home: Eating and drinking    Eat foods that have a lot of fiber, such as fresh fruits and vegetables, whole grains, and beans.  Limit foods that are high in fat, low in fiber, or overly processed, such as french fries, hamburgers, cookies, candies, and soda.  Drink enough fluid to keep your urine clear or pale yellow. General instructions   Exercise regularly or as told by your health care provider.  Go to the restroom when you have the urge to go. Do not hold it in.  Take over-the-counter and prescription medicines only as told by your health care provider. These include any fiber supplements.  Practice pelvic floor retraining exercises, such as deep breathing while relaxing the lower abdomen and pelvic floor relaxation during bowel movements.  Watch your condition for any changes.  Keep all follow-up visits as told by your health care provider. This is important. Contact a health care provider if:  You have pain that gets worse.  You have a fever.  You do not have a bowel movement after 4 days.  You vomit.  You are not hungry.  You lose weight.  You are bleeding from the anus.  You have thin, pencil-like stools. Get help right away if:  You have a fever and your  symptoms suddenly get worse.  You leak stool or have blood in your stool.  Your abdomen is bloated.  You have severe pain in your abdomen.  You feel dizzy or you faint. This information is not intended to replace advice given to you by your health care provider. Make sure you discuss any questions you have with your health care provider. Document Released: 03/08/2004 Document Revised: 12/29/2015 Document Reviewed: 11/29/2015 Elsevier Interactive Patient Education  2017 [employer].

## 2016-10-24 NOTE — Addendum Note (Signed)
Addended by: Burnett Kanaris on: 10/24/2016 11:38 AM   Modules accepted: Orders

## 2016-10-24 NOTE — Progress Notes (Signed)
Jaclyn Fields 1971/06/24 [id number]   History:    46 y.o.  for annual gyn exam with the only complaining of constipation recently when she wiped she noted some blood on the toilet patient but no recurrence. Patient otherwise doing well has had a previous tubal ligation.Patient had a postpartum tubal sterilization procedure time of her cesarean section in 2013. As a result of hemorrhage she had received a blood transfusion. In February of 2014 patient was tested for hepatitis B, C. and HIV all were negative. She stated that her PCP last week did some blood work whose been monitored for hypertension. She is overdue for mammogram. Patient would no prior history of any abnormal Pap smears.  Past medical history,surgical history, family history and social history were all reviewed and documented in the EPIC chart.  Gynecologic History Patient's last menstrual period was 10/01/2016. Contraception: tubal ligation Last Pap: 2015. Results were: normal Last mammogram: 2015. Results were: normal  Obstetric History OB History  Gravida Para Term Preterm AB Living  2 1 1   1 1   SAB TAB Ectopic Multiple Live Births    1     1    # Outcome Date GA Lbr Len/2nd Weight Sex Delivery Anes PTL Lv  2 Term 08/23/11 [redacted]w[redacted]d  6 lb 10.9 oz (3.03 kg) F CS-LTranv Spinal  LIV  1 TAB                ROS: A ROS was performed and pertinent positives and negatives are included in the history.  GENERAL: No fevers or chills. HEENT: No change in vision, no earache, sore throat or sinus congestion. NECK: No pain or stiffness. CARDIOVASCULAR: No chest pain or pressure. No palpitations. PULMONARY: No shortness of breath, cough or wheeze. GASTROINTESTINAL: No abdominal pain, nausea, vomiting or diarrhea, melena or bright red blood per rectum. GENITOURINARY: No urinary frequency, urgency, hesitancy or dysuria. MUSCULOSKELETAL: No joint or muscle pain, no back pain, no recent trauma. DERMATOLOGIC: No rash, no itching, no  lesions. ENDOCRINE: No polyuria, polydipsia, no heat or cold intolerance. No recent change in weight. HEMATOLOGICAL: No anemia or easy bruising or bleeding. NEUROLOGIC: No headache, seizures, numbness, tingling or weakness. PSYCHIATRIC: No depression, no loss of interest in normal activity or change in sleep pattern.     Exam: chaperone present  BP 130/82   Ht 5\' 3"  (1.6 m)   Wt 202 lb (91.6 kg)   LMP 10/01/2016   BMI 35.78 kg/m   Body mass index is 35.78 kg/m.  General appearance : Well developed well nourished female. No acute distress HEENT: Eyes: no retinal hemorrhage or exudates,  Neck supple, trachea midline, no carotid bruits, no thyroidmegaly Lungs: Clear to auscultation, no rhonchi or wheezes, or rib retractions  Heart: Regular rate and rhythm, no murmurs or gallops Breast:Examined in sitting and supine position were symmetrical in appearance, no palpable masses or tenderness,  no skin retraction, no nipple inversion, no nipple discharge, no skin discoloration, no axillary or supraclavicular lymphadenopathy Abdomen: no palpable masses or tenderness, no rebound or guarding Extremities: no edema or skin discoloration or tenderness  Pelvic:  Bartholin, Urethra, Skene Glands: Within normal limits             Vagina: No gross lesions or discharge  Cervix: No gross lesions or discharge  Uterus  anteverted, normal size, shape and consistency, non-tender and mobile  Adnexa  Without masses or tenderness  Anus and perineum  normal   Rectovaginal  normal  sphincter tone without palpated masses or tenderness             Hemoccult negative tested today     Assessment/Plan:  46 y.o. female for annual exam who will be prescribed Linzess 145 g tablet daily she was instructed to increase her fluid intake and fiber intake to help with her constipation which may have contributed to the isolated episodes of hematochezia. If it continues the near future she will then need to be referred to  the gastroenterologist for possible colonoscopy. She denies any family member with any history of colorectal disease. Her Pap smear was done today she was reminded to schedule her overdue mammogram. Her PCP has done her blood work. We discussed importance of monthly breast exams. Discussed importance of calcium vitamin D and weightbearing exercises for osteoporosis prevention.   Terrance Mass MD, 11:09 AM 10/24/2016

## 2016-10-25 LAB — PAP IG W/ RFLX HPV ASCU

## 2016-10-28 ENCOUNTER — Other Ambulatory Visit: Payer: Self-pay | Admitting: Anesthesiology

## 2016-10-28 DIAGNOSIS — Z1211 Encounter for screening for malignant neoplasm of colon: Secondary | ICD-10-CM

## 2016-11-04 ENCOUNTER — Other Ambulatory Visit: Payer: Self-pay | Admitting: Gynecology

## 2016-11-04 ENCOUNTER — Encounter: Payer: Self-pay | Admitting: Gynecology

## 2016-11-04 ENCOUNTER — Ambulatory Visit (INDEPENDENT_AMBULATORY_CARE_PROVIDER_SITE_OTHER): Payer: BLUE CROSS/BLUE SHIELD | Admitting: Gynecology

## 2016-11-04 VITALS — BP 136/92 | Ht 63.0 in | Wt 204.0 lb

## 2016-11-04 DIAGNOSIS — R350 Frequency of micturition: Secondary | ICD-10-CM | POA: Diagnosis not present

## 2016-11-04 DIAGNOSIS — R300 Dysuria: Secondary | ICD-10-CM

## 2016-11-04 LAB — URINALYSIS W MICROSCOPIC + REFLEX CULTURE
BILIRUBIN URINE: NEGATIVE
Casts: NONE SEEN [LPF]
Crystals: NONE SEEN [HPF]
Glucose, UA: NEGATIVE
KETONES UR: NEGATIVE
NITRITE: POSITIVE — AB
PH: 7 (ref 5.0–8.0)
RBC / HPF: NONE SEEN RBC/HPF (ref <=2)
Specific Gravity, Urine: 1.02 (ref 1.001–1.035)
Yeast: NONE SEEN [HPF]

## 2016-11-04 MED ORDER — NITROFURANTOIN MONOHYD MACRO 100 MG PO CAPS: 100.0000 mg | ORAL_CAPSULE | Freq: Two times a day (BID) | ORAL | 0 refills | Status: DC

## 2016-11-04 MED ORDER — PHENAZOPYRIDINE HCL 200 MG PO TABS: 200.0000 mg | ORAL_TABLET | Freq: Three times a day (TID) | ORAL | 0 refills | Status: DC | PRN

## 2016-11-04 NOTE — Progress Notes (Signed)
   Patient is a 46 year old who presented to the office today complaining of three-day history of worsening urinary frequency and pressure upon voiding and stating that she doesn't completely empty her bladder. She denied any back pain, fever, chills, nausea, vomiting. No GU complaints. She was seen less than 2 weeks ago for annual exam and Pap smear was normal. Patient reports normal menstrual cycle has a previous tubal ligation.  Back: No CVA tenderness Abdomen soft nontender mild suprapubic tenderness Pelvic exam not done  Urinalysis: White blood cell 40-60 Red blood cells not seen Bacteria many Nitrite positive 2+ protein  Assessment/plan: Patient with signs and symptoms consistent with urinary tract infection will be prescribed Macrobid one by mouth twice a day for 7 days and Pyridium to help with her bladder spasm 200 mg one by mouth 3 times a day for 3 days. Patient was encouraged to increase her fluid intake. If she develops any severe back pain or fever she should report to the office or after-hours to the emergency room in the event of a polynephritis.

## 2016-11-04 NOTE — Patient Instructions (Signed)
Phenazopyridine tablets What is this medicine? PHENAZOPYRIDINE (fen az oh PEER i deen) is a pain reliever. It is used to stop the pain, burning, or discomfort caused by infection or irritation of the urinary tract. This medicine is not an antibiotic. It will not cure a urinary tract infection. This medicine may be used for other purposes; ask your health care provider or pharmacist if you have questions. COMMON BRAND NAME(S): AZO, Azo-100, Azo-Gesic, Azo-Septic, Azo-Standard, Phenazo, Prodium, Pyridium, Urinary Analgesic, Uristat What should I tell my health care provider before I take this medicine? They need to know if you have any of these conditions: -glucose-6-phosphate dehydrogenase (G6PD) deficiency -kidney disease -an unusual or allergic reaction to phenazopyridine, other medicines, foods, dyes, or preservatives -pregnant or trying to get pregnant -breast-feeding How should I use this medicine? Take this medicine by mouth with a glass of water. Follow the directions on the prescription label. Take after meals. Take your doses at regular intervals. Do not take your medicine more often than directed. Do not skip doses or stop your medicine early even if you feel better. Do not stop taking except on your doctor's advice. Talk to your pediatrician regarding the use of this medicine in children. Special care may be needed. Overdosage: If you think you have taken too much of this medicine contact a poison control center or emergency room at once. NOTE: This medicine is only for you. Do not share this medicine with others. What if I miss a dose? If you miss a dose, take it as soon as you can. If it is almost time for your next dose, take only that dose. Do not take double or extra doses. What may interact with this medicine? Interactions are not expected. This list may not describe all possible interactions. Give your health care provider a list of all the medicines, herbs, non-prescription  drugs, or dietary supplements you use. Also tell them if you smoke, drink alcohol, or use illegal drugs. Some items may interact with your medicine. What should I watch for while using this medicine? Tell your doctor or health care professional if your symptoms do not improve or if they get worse. This medicine colors body fluids red. This effect is harmless and will go away after you are done taking the medicine. It will change urine to an dark orange or red color. The red color may stain clothing. Soft contact lenses may become permanently stained. It is best not to wear soft contact lenses while taking this medicine. If you are diabetic you may get a false positive result for sugar in your urine. Talk to your health care provider. What side effects may I notice from receiving this medicine? Side effects that you should report to your doctor or health care professional as soon as possible: -allergic reactions like skin rash, itching or hives, swelling of the face, lips, or tongue -blue or purple color of the skin -difficulty breathing -fever -less urine -unusual bleeding, bruising -unusual tired, weak -vomiting -yellowing of the eyes or skin Side effects that usually do not require medical attention (report to your doctor or health care professional if they continue or are bothersome): -dark urine -headache -stomach upset This list may not describe all possible side effects. Call your doctor for medical advice about side effects. You may report side effects to FDA at 1-800-FDA-1088. Where should I keep my medicine? Keep out of the reach of children. Store at room temperature between 15 and 30 degrees C (59 and 86   degrees F). Protect from light and moisture. Throw away any unused medicine after the expiration date. NOTE: This sheet is a summary. It may not cover all possible information. If you have questions about this medicine, talk to your doctor, pharmacist, or health care provider.   2018 Elsevier/Gold Standard (2008-01-07 11:04:07) Nitrofurantoin tablets or capsules What is this medicine? NITROFURANTOIN (nye troe fyoor AN toyn) is an antibiotic. It is used to treat urinary tract infections. This medicine may be used for other purposes; ask your health care provider or pharmacist if you have questions. COMMON BRAND NAME(S): Macrobid, Macrodantin, Urotoin What should I tell my health care provider before I take this medicine? They need to know if you have any of these conditions: -anemia -diabetes -glucose-6-phosphate dehydrogenase deficiency -kidney disease -liver disease -lung disease -other chronic illness -an unusual or allergic reaction to nitrofurantoin, other antibiotics, other medicines, foods, dyes or preservatives -pregnant or trying to get pregnant -breast-feeding How should I use this medicine? Take this medicine by mouth with a glass of water. Follow the directions on the prescription label. Take this medicine with food or milk. Take your doses at regular intervals. Do not take your medicine more often than directed. Do not stop taking except on your doctor's advice. Talk to your pediatrician regarding the use of this medicine in children. While this drug may be prescribed for selected conditions, precautions do apply. Overdosage: If you think you have taken too much of this medicine contact a poison control center or emergency room at once. NOTE: This medicine is only for you. Do not share this medicine with others. What if I miss a dose? If you miss a dose, take it as soon as you can. If it is almost time for your next dose, take only that dose. Do not take double or extra doses. What may interact with this medicine? -antacids containing magnesium trisilicate -probenecid -quinolone antibiotics like ciprofloxacin, lomefloxacin, norfloxacin and ofloxacin -sulfinpyrazone This list may not describe all possible interactions. Give your health care provider  a list of all the medicines, herbs, non-prescription drugs, or dietary supplements you use. Also tell them if you smoke, drink alcohol, or use illegal drugs. Some items may interact with your medicine. What should I watch for while using this medicine? Tell your doctor or health care professional if your symptoms do not improve or if you get new symptoms. Drink several glasses of water a day. If you are taking this medicine for a long time, visit your doctor for regular checks on your progress. If you are diabetic, you may get a false positive result for sugar in your urine with certain brands of urine tests. Check with your doctor. What side effects may I notice from receiving this medicine? Side effects that you should report to your doctor or health care professional as soon as possible: -allergic reactions like skin rash or hives, swelling of the face, lips, or tongue -chest pain -cough -difficulty breathing -dizziness, drowsiness -fever or infection -joint aches or pains -pale or blue-tinted skin -redness, blistering, peeling or loosening of the skin, including inside the mouth -tingling, burning, pain, or numbness in hands or feet -unusual bleeding or bruising -unusually weak or tired -yellowing of eyes or skin Side effects that usually do not require medical attention (report to your doctor or health care professional if they continue or are bothersome): -dark urine -diarrhea -headache -loss of appetite -nausea or vomiting -temporary hair loss This list may not describe all possible side effects. Call  your doctor for medical advice about side effects. You may report side effects to FDA at 1-800-FDA-1088. Where should I keep my medicine? Keep out of the reach of children. Store at room temperature between 15 and 30 degrees C (59 and 86 degrees F). Protect from light. Throw away any unused medicine after the expiration date. NOTE: This sheet is a summary. It may not cover all  possible information. If you have questions about this medicine, talk to your doctor, pharmacist, or health care provider.  2018 Elsevier/Gold Standard (2007-12-30 15:56:47) Urinary Tract Infection, Adult A urinary tract infection (UTI) is an infection of any part of the urinary tract, which includes the kidneys, ureters, bladder, and urethra. These organs make, store, and get rid of urine in the body. UTI can be a bladder infection (cystitis) or kidney infection (pyelonephritis). What are the causes? This infection may be caused by fungi, viruses, or bacteria. Bacteria are the most common cause of UTIs. This condition can also be caused by repeated incomplete emptying of the bladder during urination. What increases the risk? This condition is more likely to develop if:  You ignore your need to urinate or hold urine for long periods of time.  You do not empty your bladder completely during urination.  You wipe back to front after urinating or having a bowel movement, if you are female.  You are uncircumcised, if you are female.  You are constipated.  You have a urinary catheter that stays in place (indwelling).  You have a weak defense (immune) system.  You have a medical condition that affects your bowels, kidneys, or bladder.  You have diabetes.  You take antibiotic medicines frequently or for long periods of time, and the antibiotics no longer work well against certain types of infections (antibiotic resistance).  You take medicines that irritate your urinary tract.  You are exposed to chemicals that irritate your urinary tract.  You are female. What are the signs or symptoms? Symptoms of this condition include:  Fever.  Frequent urination or passing small amounts of urine frequently.  Needing to urinate urgently.  Pain or burning with urination.  Urine that smells bad or unusual.  Cloudy urine.  Pain in the lower abdomen or back.  Trouble urinating.  Blood in  the urine.  Vomiting or being less hungry than normal.  Diarrhea or abdominal pain.  Vaginal discharge, if you are female. How is this diagnosed? This condition is diagnosed with a medical history and physical exam. You will also need to provide a urine sample to test your urine. Other tests may be done, including:  Blood tests.  Sexually transmitted disease (STD) testing. If you have had more than one UTI, a cystoscopy or imaging studies may be done to determine the cause of the infections. How is this treated? Treatment for this condition often includes a combination of two or more of the following:  Antibiotic medicine.  Other medicines to treat less common causes of UTI.  Over-the-counter medicines to treat pain.  Drinking enough water to stay hydrated. Follow these instructions at home:  Take over-the-counter and prescription medicines only as told by your health care provider.  If you were prescribed an antibiotic, take it as told by your health care provider. Do not stop taking the antibiotic even if you start to feel better.  Avoid alcohol, caffeine, tea, and carbonated beverages. They can irritate your bladder.  Drink enough fluid to keep your urine clear or pale yellow.  Keep all  follow-up visits as told by your health care provider. This is important.  Make sure to:  Empty your bladder often and completely. Do not hold urine for long periods of time.  Empty your bladder before and after sex.  Wipe from front to back after a bowel movement if you are female. Use each tissue one time when you wipe. Contact a health care provider if:  You have back pain.  You have a fever.  You feel nauseous or vomit.  Your symptoms do not get better after 3 days.  Your symptoms go away and then return. Get help right away if:  You have severe back pain or lower abdominal pain.  You are vomiting and cannot keep down any medicines or water. This information is not  intended to replace advice given to you by your health care provider. Make sure you discuss any questions you have with your health care provider. Document Released: 03/20/2005 Document Revised: 11/22/2015 Document Reviewed: 05/01/2015 Elsevier Interactive Patient Education  2017 [employer].

## 2016-11-06 ENCOUNTER — Encounter: Payer: Self-pay | Admitting: Gynecology

## 2016-11-07 LAB — URINE CULTURE

## 2016-11-11 ENCOUNTER — Telehealth: Payer: Self-pay | Admitting: *Deleted

## 2016-11-11 ENCOUNTER — Ambulatory Visit
Admission: RE | Admit: 2016-11-11 | Discharge: 2016-11-11 | Disposition: A | Discharge status: Home / Self Care | Payer: BLUE CROSS/BLUE SHIELD | Source: Ambulatory Visit | Attending: Gynecology | Admitting: Gynecology

## 2016-11-11 DIAGNOSIS — Z1231 Encounter for screening mammogram for malignant neoplasm of breast: Secondary | ICD-10-CM

## 2016-11-11 DIAGNOSIS — K625 Hemorrhage of anus and rectum: Secondary | ICD-10-CM

## 2016-11-11 NOTE — Telephone Encounter (Signed)
Pt called requesting referral to GI due to rectal bleeding. Per note 10/24/16 "If it continues the near future she will then need to be referred to the gastroenterologist for possible colonoscopy"   Referral placed they will contact pt to scheduled.

## 2016-11-19 NOTE — Telephone Encounter (Signed)
[place] has left message to call x 2

## 2016-12-12 ENCOUNTER — Encounter: Payer: Self-pay | Admitting: Gastroenterology

## 2016-12-17 ENCOUNTER — Other Ambulatory Visit: Payer: Self-pay | Admitting: Gynecology

## 2016-12-17 ENCOUNTER — Encounter: Payer: Self-pay | Admitting: Gynecology

## 2016-12-17 ENCOUNTER — Ambulatory Visit (INDEPENDENT_AMBULATORY_CARE_PROVIDER_SITE_OTHER): Payer: BLUE CROSS/BLUE SHIELD | Admitting: Gynecology

## 2016-12-17 VITALS — BP 134/80

## 2016-12-17 DIAGNOSIS — N3000 Acute cystitis without hematuria: Secondary | ICD-10-CM | POA: Diagnosis not present

## 2016-12-17 DIAGNOSIS — R300 Dysuria: Secondary | ICD-10-CM | POA: Diagnosis not present

## 2016-12-17 DIAGNOSIS — R350 Frequency of micturition: Secondary | ICD-10-CM

## 2016-12-17 LAB — URINALYSIS W MICROSCOPIC + REFLEX CULTURE
BILIRUBIN URINE: NEGATIVE
Casts: NONE SEEN [LPF]
Crystals: NONE SEEN [HPF]
GLUCOSE, UA: NEGATIVE
Ketones, ur: NEGATIVE
NITRITE: NEGATIVE
PH: 6 (ref 5.0–8.0)
SPECIFIC GRAVITY, URINE: 1.02 (ref 1.001–1.035)
Yeast: NONE SEEN [HPF]

## 2016-12-17 MED ORDER — PHENAZOPYRIDINE HCL 200 MG PO TABS: 200.0000 mg | ORAL_TABLET | Freq: Three times a day (TID) | ORAL | 0 refills | Status: DC | PRN

## 2016-12-17 MED ORDER — NITROFURANTOIN MONOHYD MACRO 100 MG PO CAPS: 100.0000 mg | ORAL_CAPSULE | Freq: Two times a day (BID) | ORAL | 0 refills | Status: DC

## 2016-12-17 MED ORDER — NITROFURANTOIN MONOHYD MACRO 100 MG PO CAPS: ORAL_CAPSULE | ORAL | 5 refills | Status: DC

## 2016-12-17 NOTE — Addendum Note (Signed)
Addended by: Burnett Kanaris on: 12/17/2016 03:05 PM   Modules accepted: Orders

## 2016-12-17 NOTE — Patient Instructions (Signed)
Nitrofurantoin tablets or capsules What is this medicine? NITROFURANTOIN (nye troe fyoor AN toyn) is an antibiotic. It is used to treat urinary tract infections. This medicine may be used for other purposes; ask your health care provider or pharmacist if you have questions. COMMON BRAND NAME(S): Macrobid, Macrodantin, Urotoin What should I tell my health care provider before I take this medicine? They need to know if you have any of these conditions: -anemia -diabetes -glucose-6-phosphate dehydrogenase deficiency -kidney disease -liver disease -lung disease -other chronic illness -an unusual or allergic reaction to nitrofurantoin, other antibiotics, other medicines, foods, dyes or preservatives -pregnant or trying to get pregnant -breast-feeding How should I use this medicine? Take this medicine by mouth with a glass of water. Follow the directions on the prescription label. Take this medicine with food or milk. Take your doses at regular intervals. Do not take your medicine more often than directed. Do not stop taking except on your doctor's advice. Talk to your pediatrician regarding the use of this medicine in children. While this drug may be prescribed for selected conditions, precautions do apply. Overdosage: If you think you have taken too much of this medicine contact a poison control center or emergency room at once. NOTE: This medicine is only for you. Do not share this medicine with others. What if I miss a dose? If you miss a dose, take it as soon as you can. If it is almost time for your next dose, take only that dose. Do not take double or extra doses. What may interact with this medicine? -antacids containing magnesium trisilicate -probenecid -quinolone antibiotics like ciprofloxacin, lomefloxacin, norfloxacin and ofloxacin -sulfinpyrazone This list may not describe all possible interactions. Give your health care provider a list of all the medicines, herbs,  non-prescription drugs, or dietary supplements you use. Also tell them if you smoke, drink alcohol, or use illegal drugs. Some items may interact with your medicine. What should I watch for while using this medicine? Tell your doctor or health care professional if your symptoms do not improve or if you get new symptoms. Drink several glasses of water a day. If you are taking this medicine for a long time, visit your doctor for regular checks on your progress. If you are diabetic, you may get a false positive result for sugar in your urine with certain brands of urine tests. Check with your doctor. What side effects may I notice from receiving this medicine? Side effects that you should report to your doctor or health care professional as soon as possible: -allergic reactions like skin rash or hives, swelling of the face, lips, or tongue -chest pain -cough -difficulty breathing -dizziness, drowsiness -fever or infection -joint aches or pains -pale or blue-tinted skin -redness, blistering, peeling or loosening of the skin, including inside the mouth -tingling, burning, pain, or numbness in hands or feet -unusual bleeding or bruising -unusually weak or tired -yellowing of eyes or skin Side effects that usually do not require medical attention (report to your doctor or health care professional if they continue or are bothersome): -dark urine -diarrhea -headache -loss of appetite -nausea or vomiting -temporary hair loss This list may not describe all possible side effects. Call your doctor for medical advice about side effects. You may report side effects to FDA at 1-800-FDA-1088. Where should I keep my medicine? Keep out of the reach of children. Store at room temperature between 15 and 30 degrees C (59 and 86 degrees F). Protect from light. Throw away any unused  medicine after the expiration date. NOTE: This sheet is a summary. It may not cover all possible information. If you have  questions about this medicine, talk to your doctor, pharmacist, or health care provider.  2018 Elsevier/Gold Standard (2007-12-30 15:56:47) Phenazopyridine tablets What is this medicine? PHENAZOPYRIDINE (fen az oh PEER i deen) is a pain reliever. It is used to stop the pain, burning, or discomfort caused by infection or irritation of the urinary tract. This medicine is not an antibiotic. It will not cure a urinary tract infection. This medicine may be used for other purposes; ask your health care provider or pharmacist if you have questions. COMMON BRAND NAME(S): AZO, Azo-100, Azo-Gesic, Azo-Septic, Azo-Standard, Phenazo, Prodium, Pyridium, Urinary Analgesic, Uristat What should I tell my health care provider before I take this medicine? They need to know if you have any of these conditions: -glucose-6-phosphate dehydrogenase (G6PD) deficiency -kidney disease -an unusual or allergic reaction to phenazopyridine, other medicines, foods, dyes, or preservatives -pregnant or trying to get pregnant -breast-feeding How should I use this medicine? Take this medicine by mouth with a glass of water. Follow the directions on the prescription label. Take after meals. Take your doses at regular intervals. Do not take your medicine more often than directed. Do not skip doses or stop your medicine early even if you feel better. Do not stop taking except on your doctor's advice. Talk to your pediatrician regarding the use of this medicine in children. Special care may be needed. Overdosage: If you think you have taken too much of this medicine contact a poison control center or emergency room at once. NOTE: This medicine is only for you. Do not share this medicine with others. What if I miss a dose? If you miss a dose, take it as soon as you can. If it is almost time for your next dose, take only that dose. Do not take double or extra doses. What may interact with this medicine? Interactions are not  expected. This list may not describe all possible interactions. Give your health care provider a list of all the medicines, herbs, non-prescription drugs, or dietary supplements you use. Also tell them if you smoke, drink alcohol, or use illegal drugs. Some items may interact with your medicine. What should I watch for while using this medicine? Tell your doctor or health care professional if your symptoms do not improve or if they get worse. This medicine colors body fluids red. This effect is harmless and will go away after you are done taking the medicine. It will change urine to an dark orange or red color. The red color may stain clothing. Soft contact lenses may become permanently stained. It is best not to wear soft contact lenses while taking this medicine. If you are diabetic you may get a false positive result for sugar in your urine. Talk to your health care provider. What side effects may I notice from receiving this medicine? Side effects that you should report to your doctor or health care professional as soon as possible: -allergic reactions like skin rash, itching or hives, swelling of the face, lips, or tongue -blue or purple color of the skin -difficulty breathing -fever -less urine -unusual bleeding, bruising -unusual tired, weak -vomiting -yellowing of the eyes or skin Side effects that usually do not require medical attention (report to your doctor or health care professional if they continue or are bothersome): -dark urine -headache -stomach upset This list may not describe all possible side effects. Call your doctor  for medical advice about side effects. You may report side effects to FDA at 1-800-FDA-1088. Where should I keep my medicine? Keep out of the reach of children. Store at room temperature between 15 and 30 degrees C (59 and 86 degrees F). Protect from light and moisture. Throw away any unused medicine after the expiration date. NOTE: This sheet is a summary.  It may not cover all possible information. If you have questions about this medicine, talk to your doctor, pharmacist, or health care provider.  2018 Elsevier/Gold Standard (2008-01-07 11:04:07) Urinary Tract Infection, Adult A urinary tract infection (UTI) is an infection of any part of the urinary tract. The urinary tract includes the:  Kidneys.  Ureters.  Bladder.  Urethra.  These organs make, store, and get rid of pee (urine) in the body. Follow these instructions at home:  Take over-the-counter and prescription medicines only as told by your doctor.  If you were prescribed an antibiotic medicine, take it as told by your doctor. Do not stop taking the antibiotic even if you start to feel better.  Avoid the following drinks: ? Alcohol. ? Caffeine. ? Tea. ? Carbonated drinks.  Drink enough fluid to keep your pee clear or pale yellow.  Keep all follow-up visits as told by your doctor. This is important.  Make sure to: ? Empty your bladder often and completely. Do not to hold pee for long periods of time. ? Empty your bladder before and after sex. ? Wipe from front to back after a bowel movement if you are female. Use each tissue one time when you wipe. Contact a doctor if:  You have back pain.  You have a fever.  You feel sick to your stomach (nauseous).  You throw up (vomit).  Your symptoms do not get better after 3 days.  Your symptoms go away and then come back. Get help right away if:  You have very bad back pain.  You have very bad lower belly (abdominal) pain.  You are throwing up and cannot keep down any medicines or water. This information is not intended to replace advice given to you by your health care provider. Make sure you discuss any questions you have with your health care provider. Document Released: 11/27/2007 Document Revised: 11/16/2015 Document Reviewed: 05/01/2015 Elsevier Interactive Patient Education  [employer].

## 2016-12-17 NOTE — Progress Notes (Signed)
   46 year old patient presented to the office complaining of dysuria, frequency and sensation of incompletely emptying her bladder. She denied any fever that she felt warm last night, chills, nausea, vomiting or any back pain. Review of her record indicated 6 weeks ago she was treated for urinary tract infection and Escherichia coli was the identified microorganism and she was on the appropriate antibiotic Macrobid. She denied any vaginal discharge  Exam: Gen. appearance well-developed well-nourished female above-mentioned complaining Back: No CVA tenderness Abdomen: Suprapubic tenderness but no rebound or guarding Pelvic exam not done Rectal exam: Not done  Urinalysis dipstick 2+ protein 1+ leukocyte trace blood microscopic urinalysis demonstrated 20-40 WBC, 3-10 RBC and moderate bacteria and urine culture submitted.  Assessment/plan: Patient second urinary tract infection in 6 weeks previous microorganism was Escherichia coli sensitive to Macrobid. She will be placed on Macrobid one by mouth twice a day for 10 days instead of 7 days and Pyridium 200 mg one by mouth 3 times a day for 3 days. I've also given her additional number of Macrobid tablets with refills for her to take 1 after intercourse for prophylaxis.

## 2016-12-18 ENCOUNTER — Other Ambulatory Visit: Payer: Self-pay | Admitting: Gynecology

## 2016-12-18 ENCOUNTER — Telehealth: Payer: Self-pay

## 2016-12-18 NOTE — Telephone Encounter (Signed)
Patient called because when she picked up Macrobid Rx it was for #14. She said Dr. Moshe Salisbury told her he was giving her a larger quantity w refills. Her med list reflects the latter. I called the pharmacy and they said Dr. Moshe Salisbury sent both Rx's.  Ins will only allow them to fill one in a day.  I advised patient that I imagine he cancelled that #14 Rx but did not have anyone call the pharmacy to cancel with them.  She can pick up the #30 Rx after July 1st and she will do that. Meantime she will use the one she has for treatment.

## 2016-12-19 NOTE — Telephone Encounter (Signed)
Pt scheduled on 01/23/17 with Dr.Danis

## 2016-12-20 LAB — URINE CULTURE

## 2017-01-13 DIAGNOSIS — H2000 Unspecified acute and subacute iridocyclitis: Secondary | ICD-10-CM | POA: Diagnosis not present

## 2017-01-20 ENCOUNTER — Encounter: Payer: Self-pay | Admitting: Family Medicine

## 2017-01-20 ENCOUNTER — Ambulatory Visit (INDEPENDENT_AMBULATORY_CARE_PROVIDER_SITE_OTHER): Payer: BLUE CROSS/BLUE SHIELD | Admitting: Family Medicine

## 2017-01-20 VITALS — BP 120/70 | HR 70 | Ht 63.0 in | Wt 204.0 lb

## 2017-01-20 DIAGNOSIS — H2000 Unspecified acute and subacute iridocyclitis: Secondary | ICD-10-CM | POA: Diagnosis not present

## 2017-01-20 DIAGNOSIS — M940 Chondrocostal junction syndrome [Tietze]: Secondary | ICD-10-CM | POA: Diagnosis not present

## 2017-01-20 DIAGNOSIS — R079 Chest pain, unspecified: Secondary | ICD-10-CM

## 2017-01-20 MED ORDER — MELOXICAM 7.5 MG PO TABS: ORAL_TABLET | ORAL | 0 refills | Status: DC

## 2017-01-20 NOTE — Progress Notes (Signed)
Name: Jaclyn Fields   MRN: [id number]    DOB: 08-23-1970   Date:01/20/2017       Progress Note  Subjective  Chief Complaint  Chief Complaint  Patient presents with  . Chest Pain    starts after eating spicy foods    Chest Pain   This is a new problem. The current episode started 1 to 4 weeks ago. The onset quality is gradual. The problem occurs intermittently. The problem has been waxing and waning. The pain is present in the substernal region. The pain is moderate. The quality of the pain is described as tightness, burning and stabbing (stabbing turn to certain position). The pain radiates to the mid back. Associated symptoms include back pain. Pertinent negatives include no abdominal pain, cough, diaphoresis, dizziness, exertional chest pressure, fever, headaches, hemoptysis, irregular heartbeat, malaise/fatigue, nausea, numbness, orthopnea, palpitations, PND, shortness of breath or sputum production. The pain is aggravated by deep breathing. She has tried NSAIDs for the symptoms. The treatment provided moderate relief. Prior diagnostic workup includes stress echo.    No problem-specific Assessment & Plan notes found for this encounter.   Past Medical History:  Diagnosis Date  . Bartholin's duct cyst    Bartholin duct cyst 2 left last one 2015  . Hypertension   . Maternal iron deficiency anemia 08/24/2011  . PP care - s/p 1C/S (breech, [facility]) 08/24/2011    Past Surgical History:  Procedure Laterality Date  . AUGMENTATION MAMMAPLASTY Bilateral 2005  . CESAREAN SECTION    . DILATION AND EVACUATION  09/05/2011   Procedure: DILATATION AND EVACUATION;  Surgeon: Elveria Royals, MD;  Location: [facility];  Service: Gynecology;  Laterality: Bilateral;  . TUBAL LIGATION    . WISDOM TOOTH EXTRACTION      Family History  Problem Relation Age of Onset  . Hypertension Mother   . Hypertension Father   . Diabetes Father     Social History   Social History  . Marital status: Married     Spouse name: N/A  . Number of children: N/A  . Years of education: N/A   Occupational History  . Not on file.   Social History Main Topics  . Smoking status: Never Smoker  . Smokeless tobacco: Never Used  . Alcohol use 0.0 oz/week     Comment: occasionally  . Drug use: No  . Sexual activity: Yes    Birth control/ protection: Surgical     Comment: tubal ligation   Other Topics Concern  . Not on file   Social History Narrative  . No narrative on file    No Known Allergies  Outpatient Medications Prior to Visit  Medication Sig Dispense Refill  . cetirizine (ZYRTEC) 10 MG tablet Take 1 tablet (10 mg total) by mouth daily. 90 tablet 3  . fluticasone (FLONASE) 50 MCG/ACT nasal spray Place 2 sprays into both nostrils daily. 16 g 12  . hydrochlorothiazide (HYDRODIURIL) 25 MG tablet Take 1 tablet (25 mg total) by mouth daily. 90 tablet 1  . ibuprofen (ADVIL,MOTRIN) 800 MG tablet Take 1 tablet (800 mg total) by mouth every 8 (eight) hours as needed. 30 tablet 5  . metoprolol succinate (TOPROL-XL) 100 MG 24 hr tablet Take 1 tablet (100 mg total) by mouth daily. Take with or immediately following a meal. 90 tablet 1  . nitrofurantoin, macrocrystal-monohydrate, (MACROBID) 100 MG capsule Take 1 by mouth twice a day for 10 days. Then take 1 tablet after intercourse when necessary 30 capsule 5  .  phenazopyridine (PYRIDIUM) 200 MG tablet Take 1 tablet (200 mg total) by mouth 3 (three) times daily as needed for pain. 9 tablet 0   No facility-administered medications prior to visit.     Review of Systems  Constitutional: Negative for chills, diaphoresis, fever, malaise/fatigue and weight loss.  HENT: Negative for ear discharge, ear pain and sore throat.   Eyes: Negative for blurred vision.  Respiratory: Negative for cough, hemoptysis, sputum production, shortness of breath and wheezing.   Cardiovascular: Positive for chest pain. Negative for palpitations, orthopnea, leg swelling and PND.   Gastrointestinal: Negative for abdominal pain, blood in stool, constipation, diarrhea, heartburn, melena and nausea.  Genitourinary: Negative for dysuria, frequency, hematuria and urgency.  Musculoskeletal: Positive for back pain. Negative for joint pain, myalgias and neck pain.  Skin: Negative for rash.  Neurological: Negative for dizziness, tingling, sensory change, focal weakness, numbness and headaches.  Endo/Heme/Allergies: Negative for environmental allergies and polydipsia. Does not bruise/bleed easily.  Psychiatric/Behavioral: Negative for depression and suicidal ideas. The patient is not nervous/anxious and does not have insomnia.      Objective  Vitals:   01/20/17 1542  BP: 120/70  Pulse: 70  Weight: 204 lb (92.5 kg)  Height: 5\' 3"  (1.6 m)    Physical Exam  Constitutional: She is well-developed, well-nourished, and in no distress. No distress.  HENT:  Head: Normocephalic and atraumatic.  Right Ear: External ear normal.  Left Ear: External ear normal.  Nose: Nose normal.  Mouth/Throat: Oropharynx is clear and moist.  Eyes: Pupils are equal, round, and reactive to light. Conjunctivae and EOM are normal. Right eye exhibits no discharge. Left eye exhibits no discharge.  Neck: Normal range of motion. Neck supple. No JVD present. No thyromegaly present.  Cardiovascular: Normal rate, regular rhythm, normal heart sounds and intact distal pulses.  Exam reveals no gallop and no friction rub.   No murmur heard. Pulmonary/Chest: Effort normal and breath sounds normal. She has no wheezes. She has no rales.  Abdominal: Soft. Bowel sounds are normal. She exhibits no mass. There is no tenderness. There is no guarding.  Musculoskeletal: Normal range of motion. She exhibits no edema.  Lymphadenopathy:    She has no cervical adenopathy.  Neurological: She is alert. She has normal reflexes.  Skin: Skin is warm and dry. She is not diaphoretic.  Psychiatric: Mood and affect normal.   Nursing note and vitals reviewed.     Assessment & Plan  Problem List Items Addressed This Visit    None    Visit Diagnoses    Chest pain, unspecified type    -  Primary   Relevant Orders   EKG 12-Lead (Completed)   Costochondritis       Relevant Medications   meloxicam (MOBIC) 7.5 MG tablet      Meds ordered this encounter  Medications  . meloxicam (MOBIC) 7.5 MG tablet    Sig: One twice a day    Dispense:  30 tablet    Refill:  0      Dr. Mac[facility]  01/20/17

## 2017-01-20 NOTE — Patient Instructions (Signed)
Costochondritis Costochondritis is swelling and irritation (inflammation) of the tissue (cartilage) that connects your ribs to your breastbone (sternum). This causes pain in the front of your chest. The pain usually starts gradually and involves more than one rib. What are the causes? The exact cause of this condition is not always known. It results from stress on the cartilage where your ribs attach to your sternum. The cause of this stress could be:  Chest injury (trauma).  Exercise or activity, such as lifting.  Severe coughing.  What increases the risk? You may be at higher risk for this condition if you:  Are female.  Are 30?46 years old.  Recently started a new exercise or work activity.  Have low levels of vitamin D.  Have a condition that makes you cough frequently.  What are the signs or symptoms? The main symptom of this condition is chest pain. The pain:  Usually starts gradually and can be sharp or dull.  Gets worse with deep breathing, coughing, or exercise.  Gets better with rest.  May be worse when you press on the sternum-rib connection (tenderness).  How is this diagnosed? This condition is diagnosed based on your symptoms, medical history, and a physical exam. Your health care provider will check for tenderness when pressing on your sternum. This is the most important finding. You may also have tests to rule out other causes of chest pain. These may include:  A chest X-ray to check for lung problems.  An electrocardiogram (ECG) to see if you have a heart problem that could be causing the pain.  An imaging scan to rule out a chest or rib fracture.  How is this treated? This condition usually goes away on its own over time. Your health care provider may prescribe an NSAID to reduce pain and inflammation. Your health care provider may also suggest that you:  Rest and avoid activities that make pain worse.  Apply heat or cold to the area to reduce pain  and inflammation.  Do exercises to stretch your chest muscles.  If these treatments do not help, your health care provider may inject a numbing medicine at the sternum-rib connection to help relieve the pain. Follow these instructions at home:  Avoid activities that make pain worse. This includes any activities that use chest, abdominal, and side muscles.  If directed, put ice on the painful area: ? Put ice in a plastic bag. ? Place a towel between your skin and the bag. ? Leave the ice on for 20 minutes, 2-3 times a day.  If directed, apply heat to the affected area as often as told by your health care provider. Use the heat source that your health care provider recommends, such as a moist heat pack or a heating pad. ? Place a towel between your skin and the heat source. ? Leave the heat on for 20-30 minutes. ? Remove the heat if your skin turns bright red. This is especially important if you are unable to feel pain, heat, or cold. You may have a greater risk of getting burned.  Take over-the-counter and prescription medicines only as told by your health care provider.  Return to your normal activities as told by your health care provider. Ask your health care provider what activities are safe for you.  Keep all follow-up visits as told by your health care provider. This is important. Contact a health care provider if:  You have chills or a fever.  Your pain does not go   away or it gets worse.  You have a cough that does not go away (is persistent). Get help right away if:  You have shortness of breath. This information is not intended to replace advice given to you by your health care provider. Make sure you discuss any questions you have with your health care provider. Document Released: 03/20/2005 Document Revised: 12/29/2015 Document Reviewed: 10/04/2015 Elsevier Interactive Patient Education  2018 [employer]   

## 2017-01-23 ENCOUNTER — Ambulatory Visit: Payer: BLUE CROSS/BLUE SHIELD | Admitting: Gastroenterology

## 2017-01-31 ENCOUNTER — Other Ambulatory Visit: Payer: Self-pay | Admitting: Family Medicine

## 2017-01-31 DIAGNOSIS — M940 Chondrocostal junction syndrome [Tietze]: Secondary | ICD-10-CM

## 2017-01-31 DIAGNOSIS — H2000 Unspecified acute and subacute iridocyclitis: Secondary | ICD-10-CM | POA: Diagnosis not present

## 2017-02-02 ENCOUNTER — Other Ambulatory Visit: Payer: Self-pay | Admitting: Family Medicine

## 2017-02-02 DIAGNOSIS — I10 Essential (primary) hypertension: Secondary | ICD-10-CM

## 2017-02-10 ENCOUNTER — Ambulatory Visit (INDEPENDENT_AMBULATORY_CARE_PROVIDER_SITE_OTHER): Payer: BLUE CROSS/BLUE SHIELD | Admitting: Gastroenterology

## 2017-02-10 ENCOUNTER — Encounter (INDEPENDENT_AMBULATORY_CARE_PROVIDER_SITE_OTHER): Payer: Self-pay

## 2017-02-10 ENCOUNTER — Encounter: Payer: Self-pay | Admitting: Gastroenterology

## 2017-02-10 ENCOUNTER — Other Ambulatory Visit (INDEPENDENT_AMBULATORY_CARE_PROVIDER_SITE_OTHER): Payer: BLUE CROSS/BLUE SHIELD

## 2017-02-10 VITALS — BP 140/86 | HR 68 | Ht 63.0 in | Wt 206.0 lb

## 2017-02-10 DIAGNOSIS — K625 Hemorrhage of anus and rectum: Secondary | ICD-10-CM

## 2017-02-10 DIAGNOSIS — K641 Second degree hemorrhoids: Secondary | ICD-10-CM

## 2017-02-10 LAB — CBC WITH DIFFERENTIAL/PLATELET
BASOS ABS: 0.1 10*3/uL (ref 0.0–0.1)
Basophils Relative: 0.7 % (ref 0.0–3.0)
EOS ABS: 0.1 10*3/uL (ref 0.0–0.7)
Eosinophils Relative: 0.9 % (ref 0.0–5.0)
HCT: 40.9 % (ref 36.0–46.0)
Hemoglobin: 13.6 g/dL (ref 12.0–15.0)
LYMPHS ABS: 1.3 10*3/uL (ref 0.7–4.0)
Lymphocytes Relative: 12.5 % (ref 12.0–46.0)
MCHC: 33.1 g/dL (ref 30.0–36.0)
MCV: 93.8 fl (ref 78.0–100.0)
MONO ABS: 0.7 10*3/uL (ref 0.1–1.0)
MONOS PCT: 6.9 % (ref 3.0–12.0)
NEUTROS PCT: 79 % — AB (ref 43.0–77.0)
Neutro Abs: 8.3 10*3/uL — ABNORMAL HIGH (ref 1.4–7.7)
PLATELETS: 302 10*3/uL (ref 150.0–400.0)
RBC: 4.36 Mil/uL (ref 3.87–5.11)
RDW: 12.9 % (ref 11.5–15.5)
WBC: 10.5 10*3/uL (ref 4.0–10.5)

## 2017-02-10 MED ORDER — HYDROCORTISONE ACETATE 25 MG RE SUPP: 25.0000 mg | Freq: Every evening | RECTAL | 1 refills | Status: DC | PRN

## 2017-02-10 MED ORDER — SUPREP BOWEL PREP KIT 17.5-3.13-1.6 GM/177ML PO SOLN: ORAL | 0 refills | Status: DC

## 2017-02-10 NOTE — Patient Instructions (Signed)
You have been scheduled for a colonoscopy. Please follow written instructions given to you at your visit today.  Please pick up your prep supplies at the pharmacy within the next 1-3 days. If you use inhalers (even only as needed), please bring them with you on the day of your procedure. Your physician has requested that you go to www.startemmi.com and enter the access code given to you at your visit today. This web site gives a general overview about your procedure. However, you should still follow specific instructions given to you by our office regarding your preparation for the procedure.  Your physician has requested that you go to the basement for the following lab work before leaving today: CBC  We have sent the following medications to your pharmacy for you to pick up at your convenience: Anusol suppositories  You have been given information about hemorrhoid banding.

## 2017-02-10 NOTE — Progress Notes (Signed)
Jaclyn Fields    [id number]    09-12-1970  Primary Care Physician:Jones, Iven Finn, MD  Referring Physician: Juline Patch, MD [address] [place], [place] [address]  Chief complaint:  Rectal bleeding   HPI:  46 year old female here for new patient visit with complaints of blood per rectum on and off for past 3-4 months. She started noticing small volume bright red blood per rectum on toilet paper and she wipes after a bowel movement since May/ June of this year. Last week she had an episode with significant bleeding that filled the toilet bowl and had blood dripping down her leg. No further bleeding since then. Patient denies constipation but sometimes she does strain to have a bowel movement. Denies any diarrhea. No nausea, vomiting, dysphagia, odynophagia, abdominal pain or melena. No family history of colon cancer. Her mother and grandmother had some colon polyps removed. Grandfather diagnosed with pancreatic cancer in his 16s.    Outpatient Encounter Prescriptions as of 02/10/2017  Medication Sig  . cetirizine (ZYRTEC) 10 MG tablet Take 1 tablet (10 mg total) by mouth daily.  . fluticasone (FLONASE) 50 MCG/ACT nasal spray Place 2 sprays into both nostrils daily.  . hydrochlorothiazide (HYDRODIURIL) 25 MG tablet TAKE 1 TABLET (25 MG TOTAL) BY MOUTH DAILY.  Marland Kitchen ibuprofen (ADVIL,MOTRIN) 800 MG tablet Take 1 tablet (800 mg total) by mouth every 8 (eight) hours as needed.  . meloxicam (MOBIC) 7.5 MG tablet TAKE 1 TABLET BY MOUTH TWICE A DAY  . metoprolol succinate (TOPROL-XL) 100 MG 24 hr tablet Take 1 tablet (100 mg total) by mouth daily. Take with or immediately following a meal.  . nitrofurantoin, macrocrystal-monohydrate, (MACROBID) 100 MG capsule Take 1 by mouth twice a day for 10 days. Then take 1 tablet after intercourse when necessary  . [DISCONTINUED] phenazopyridine (PYRIDIUM) 200 MG tablet Take 1 tablet (200 mg total) by mouth 3 (three) times daily as  needed for pain.   No facility-administered encounter medications on file as of 02/10/2017.     Allergies as of 02/10/2017  . (No Known Allergies)    Past Medical History:  Diagnosis Date  . Bartholin's duct cyst    Bartholin duct cyst 2 left last one 2015  . Hypertension   . Maternal iron deficiency anemia 08/24/2011  . PP care - s/p 1C/S (breech, [facility]) 08/24/2011    Past Surgical History:  Procedure Laterality Date  . AUGMENTATION MAMMAPLASTY Bilateral 2005  . CESAREAN SECTION    . DILATION AND EVACUATION  09/05/2011   Procedure: DILATATION AND EVACUATION;  Surgeon: Elveria Royals, MD;  Location: [facility];  Service: Gynecology;  Laterality: Bilateral;  . TUBAL LIGATION    . WISDOM TOOTH EXTRACTION      Family History  Problem Relation Age of Onset  . Hypertension Mother   . Hypertension Father   . Diabetes Father     Social History   Social History  . Marital status: Married    Spouse name: N/A  . Number of children: N/A  . Years of education: N/A   Occupational History  . Not on file.   Social History Main Topics  . Smoking status: Never Smoker  . Smokeless tobacco: Never Used  . Alcohol use 0.0 oz/week     Comment: occasionally  . Drug use: No  . Sexual activity: Yes    Birth control/ protection: Surgical     Comment: tubal ligation  Other Topics Concern  . Not on file   Social History Narrative  . No narrative on file      Review of systems: Review of Systems  Constitutional: Negative for fever and chills.  HENT: Positive for allergies, sinus trouble Eyes: Negative for blurred vision.  Respiratory: Negative for cough, shortness of breath and wheezing.   Cardiovascular: Negative for chest pain and palpitations.  Gastrointestinal: as per HPI Genitourinary: Negative for dysuria, urgency, frequency and hematuria.  Musculoskeletal: Negative for myalgias, back pain and joint pain.  Skin: Negative for itching and rash.  Neurological: Negative for  dizziness, tremors, focal weakness, seizures and loss of consciousness.  Endo/Heme/Allergies: Positive for seasonal allergies.  Psychiatric/Behavioral: Negative for depression, suicidal ideas and hallucinations.  All other systems reviewed and are negative.   Physical Exam: Vitals:   02/10/17 0957  BP: 140/86  Pulse: 68   Body mass index is 36.49 kg/m. Gen:      No acute distress HEENT:  EOMI, sclera anicteric Neck:     No masses; no thyromegaly Lungs:    Clear to auscultation bilaterally; normal respiratory effort CV:         Regular rate and rhythm; no murmurs Abd:      + bowel sounds; soft, non-tender; no palpable masses, no distension Ext:    No edema; adequate peripheral perfusion Skin:      Warm and dry; no rash Neuro: alert and oriented x 3 Psych: normal mood and affect Rectal exam: Normal anal sphincter tone, no anal fissure or external hemorrhoids Anoscopy: Grade II internal hemorrhoids in R anterior, R posterior and L lateral positions , no active bleeding, normal dentate line, no visible nodules  Data Reviewed:  Reviewed labs, radiology imaging, old records and pertinent past GI work up   Assessment and Plan/Recommendations:  45 year old female with intermittent bright red blood per rectum, small volume likely secondary to bleeding internal hemorrhoids but cannot exclude rectal lesion, ulcer, inflammatory polyp or malignancy Schedule for colonoscopy for evaluation The risks and benefits as well as alternatives of endoscopic procedure(s) have been discussed and reviewed. All questions answered. The patient agrees to proceed.  Check CBC If no other etiology than bleeding internal hemorrhoids based on colonoscopy, we'll consider hemorrhoidal band ligation after the colonoscopy Okay to use Anusol suppository at bedtime as needed Benefiber 1 tablespoon 3 times daily with meals   K. Denzil Magnuson , MD [phone] Mon-Fri 8a-5p [phone] after 5p, weekends,  holidays  CC: Juline Patch, MD

## 2017-02-13 ENCOUNTER — Other Ambulatory Visit: Payer: Self-pay | Admitting: Family Medicine

## 2017-02-13 DIAGNOSIS — M940 Chondrocostal junction syndrome [Tietze]: Secondary | ICD-10-CM

## 2017-02-17 ENCOUNTER — Other Ambulatory Visit: Payer: Self-pay

## 2017-02-17 DIAGNOSIS — J301 Allergic rhinitis due to pollen: Secondary | ICD-10-CM

## 2017-02-17 DIAGNOSIS — J0100 Acute maxillary sinusitis, unspecified: Secondary | ICD-10-CM

## 2017-02-17 MED ORDER — FLUTICASONE PROPIONATE 50 MCG/ACT NA SUSP: 2.0000 | Freq: Every day | NASAL | 11 refills | Status: DC

## 2017-03-04 ENCOUNTER — Ambulatory Visit: Payer: BLUE CROSS/BLUE SHIELD | Admitting: Family Medicine

## 2017-03-12 ENCOUNTER — Ambulatory Visit (AMBULATORY_SURGERY_CENTER): Payer: BLUE CROSS/BLUE SHIELD | Admitting: Gastroenterology

## 2017-03-12 ENCOUNTER — Encounter: Payer: Self-pay | Admitting: Gastroenterology

## 2017-03-12 VITALS — BP 137/74 | HR 68 | Temp 97.7°F | Resp 10 | Ht 63.0 in | Wt 206.0 lb

## 2017-03-12 DIAGNOSIS — D122 Benign neoplasm of ascending colon: Secondary | ICD-10-CM | POA: Diagnosis not present

## 2017-03-12 DIAGNOSIS — K625 Hemorrhage of anus and rectum: Secondary | ICD-10-CM

## 2017-03-12 MED ORDER — SODIUM CHLORIDE 0.9 % IV SOLN: 500.0000 mL | INTRAVENOUS | Status: DC

## 2017-03-12 NOTE — Patient Instructions (Signed)
YOU HAD AN ENDOSCOPIC PROCEDURE TODAY AT [facility]:   Refer to the procedure report that was given to you for any specific questions about what was found during the examination.  If the procedure report does not answer your questions, please call your gastroenterologist to clarify.  If you requested that your care partner not be given the details of your procedure findings, then the procedure report has been included in a sealed envelope for you to review at your convenience later.  YOU SHOULD EXPECT: Some feelings of bloating in the abdomen. Passage of more gas than usual.  Walking can help get rid of the air that was put into your GI tract during the procedure and reduce the bloating. If you had a lower endoscopy (such as a colonoscopy or flexible sigmoidoscopy) you may notice spotting of blood in your stool or on the toilet paper. If you underwent a bowel prep for your procedure, you may not have a normal bowel movement for a few days.  Please Note:  You might notice some irritation and congestion in your nose or some drainage.  This is from the oxygen used during your procedure.  There is no need for concern and it should clear up in a day or so.  SYMPTOMS TO REPORT IMMEDIATELY:   Following lower endoscopy (colonoscopy or flexible sigmoidoscopy):  Excessive amounts of blood in the stool  Significant tenderness or worsening of abdominal pains  Swelling of the abdomen that is new, acute  Fever of 100F or higher   For urgent or emergent issues, a gastroenterologist can be reached at any hour by calling [phone].   DIET:  We do recommend a small meal at first, but then you may proceed to your regular diet.  Drink plenty of fluids but you should avoid alcoholic beverages for 24 hours.  ACTIVITY:  You should plan to take it easy for the rest of today and you should NOT DRIVE or use heavy machinery until tomorrow (because of the sedation medicines used during the test).     FOLLOW UP: Our staff will call the number listed on your records the next business day following your procedure to check on you and address any questions or concerns that you may have regarding the information given to you following your procedure. If we do not reach you, we will leave a message.  However, if you are feeling well and you are not experiencing any problems, there is no need to return our call.  We will assume that you have returned to your regular daily activities without incident.  If any biopsies were taken you will be contacted by phone or by letter within the next 1-3 weeks.  Please call us at [phone] if you have not heard about the biopsies in 3 weeks.    SIGNATURES/CONFIDENTIALITY: You and/or your care partner have signed paperwork which will be entered into your electronic medical record.  These signatures attest to the fact that that the information above on your After Visit Summary has been reviewed and is understood.  Full responsibility of the confidentiality of this discharge information lies with you and/or your care-partner.  Diverticulosis, polyp and hemorrhoid information given.

## 2017-03-12 NOTE — Progress Notes (Signed)
Called to room to assist during endoscopic procedure.  Patient ID and intended procedure confirmed with present staff. Received instructions for my participation in the procedure from the performing physician.  

## 2017-03-12 NOTE — Progress Notes (Signed)
Pt's states no medical or surgical changes since previsit or office visit. 

## 2017-03-12 NOTE — Progress Notes (Signed)
Report to PACU, RN, vss, BBS= Clear.  

## 2017-03-12 NOTE — Op Note (Addendum)
[facility] Patient Name: Jaclyn Fields Procedure Date: 03/12/2017 1:16 PM MRN: [id number] Endoscopist: Mauri Pole , MD Age: 46 Referring MD:  Date of Birth: 12-Jan-1971 Gender: Female Account #: [id number] Procedure:                Colonoscopy Indications:              Evaluation of unexplained GI bleeding Medicines:                Monitored Anesthesia Care Procedure:                Pre-Anesthesia Assessment:                           - Prior to the procedure, a History and Physical                            was performed, and patient medications and                            allergies were reviewed. The patient's tolerance of                            previous anesthesia was also reviewed. The risks                            and benefits of the procedure and the sedation                            options and risks were discussed with the patient.                            All questions were answered, and informed consent                            was obtained. Prior Anticoagulants: The patient has                            taken no previous anticoagulant or antiplatelet                            agents. ASA Grade Assessment: II - A patient with                            mild systemic disease. After reviewing the risks                            and benefits, the patient was deemed in                            satisfactory condition to undergo the procedure.                           After obtaining informed consent, the colonoscope  was passed under direct vision. Throughout the                            procedure, the patient's blood pressure, pulse, and                            oxygen saturations were monitored continuously. The                            Colonoscope was introduced through the anus and                            advanced to the the cecum, identified by                            appendiceal orifice and  ileocecal valve. The                            colonoscopy was performed without difficulty. The                            patient tolerated the procedure well. The quality                            of the bowel preparation was excellent. The                            ileocecal valve, appendiceal orifice, and rectum                            were photographed. Scope In: 1:18:50 PM Scope Out: 1:35:12 PM Scope Withdrawal Time: 0 hours 12 minutes 21 seconds  Total Procedure Duration: 0 hours 16 minutes 22 seconds  Findings:                 The perianal and digital rectal examinations were                            normal.                           A 5 mm polyp was found in the ascending colon. The                            polyp was sessile. The polyp was removed with a                            cold snare. Resection and retrieval were complete.                           A few small-mouthed diverticula were found in the                            sigmoid colon.  Non-bleeding internal hemorrhoids were found during                            retroflexion. The hemorrhoids were small.                           The exam was otherwise without abnormality. Complications:            No immediate complications. Estimated Blood Loss:     Estimated blood loss was minimal. Impression:               - One 5 mm polyp in the ascending colon, removed                            with a cold snare. Resected and retrieved.                           - Diverticulosis in the sigmoid colon.                           - Non-bleeding internal hemorrhoids.                           - The examination was otherwise normal. Recommendation:           - Patient has a contact number available for                            emergencies. The signs and symptoms of potential                            delayed complications were discussed with the                            patient. Return to  normal activities tomorrow.                            Written discharge instructions were provided to the                            patient.                           - Resume previous diet.                           - Continue present medications.                           - Await pathology results.                           - Repeat colonoscopy in 5-10 years for surveillance                            based on pathology results.                           -  Return to GI clinic at the next available                            appointment for hemorrhoidal band ligation Mauri Pole, MD 03/12/2017 1:40:08 PM This report has been signed electronically. Addendum Number: 1   Addendum Date: 03/12/2017 3:10:30 PM      Specimen was misplaced      Repeat colonoscopy in 5 years Mauri Pole, MD 03/12/2017 3:11:22 PM This report has been signed electronically.

## 2017-03-13 ENCOUNTER — Telehealth: Payer: Self-pay

## 2017-03-13 NOTE — Telephone Encounter (Signed)
  Follow up Call-  Call back number 03/12/2017  Post procedure Call Back phone  # [phone]  Permission to leave phone message Yes  Some recent data might be hidden     Patient questions:  Do you have a fever, pain , or abdominal swelling? No. Pain Score  0 *  Have you tolerated food without any problems? Yes.    Have you been able to return to your normal activities? Yes.    Do you have any questions about your discharge instructions: Diet   No. Medications  No. Follow up visit  No.  Do you have questions or concerns about your Care? No.  Actions: * If pain score is 4 or above: No action needed, pain <4.

## 2017-03-17 ENCOUNTER — Ambulatory Visit: Payer: BLUE CROSS/BLUE SHIELD | Admitting: Gastroenterology

## 2017-04-17 DIAGNOSIS — M25572 Pain in left ankle and joints of left foot: Secondary | ICD-10-CM | POA: Diagnosis not present

## 2017-04-21 ENCOUNTER — Encounter: Payer: BLUE CROSS/BLUE SHIELD | Admitting: Gastroenterology

## 2017-04-29 ENCOUNTER — Other Ambulatory Visit: Payer: Self-pay | Admitting: Family Medicine

## 2017-04-29 DIAGNOSIS — I10 Essential (primary) hypertension: Secondary | ICD-10-CM

## 2017-04-30 ENCOUNTER — Other Ambulatory Visit: Payer: Self-pay | Admitting: Family Medicine

## 2017-04-30 DIAGNOSIS — J0100 Acute maxillary sinusitis, unspecified: Secondary | ICD-10-CM

## 2017-06-03 ENCOUNTER — Other Ambulatory Visit: Payer: Self-pay | Admitting: Family Medicine

## 2017-06-03 DIAGNOSIS — I10 Essential (primary) hypertension: Secondary | ICD-10-CM

## 2017-07-04 ENCOUNTER — Encounter: Payer: Self-pay | Admitting: Family Medicine

## 2017-07-04 ENCOUNTER — Ambulatory Visit (INDEPENDENT_AMBULATORY_CARE_PROVIDER_SITE_OTHER): Payer: BLUE CROSS/BLUE SHIELD | Admitting: Family Medicine

## 2017-07-04 VITALS — BP 130/78 | HR 72 | Ht 63.0 in | Wt 212.0 lb

## 2017-07-04 DIAGNOSIS — E669 Obesity, unspecified: Secondary | ICD-10-CM | POA: Diagnosis not present

## 2017-07-04 DIAGNOSIS — I10 Essential (primary) hypertension: Secondary | ICD-10-CM | POA: Diagnosis not present

## 2017-07-04 DIAGNOSIS — J069 Acute upper respiratory infection, unspecified: Secondary | ICD-10-CM

## 2017-07-04 MED ORDER — METOPROLOL SUCCINATE ER 100 MG PO TB24: 100.0000 mg | ORAL_TABLET | Freq: Every day | ORAL | 0 refills | Status: DC

## 2017-07-04 MED ORDER — HYDROCHLOROTHIAZIDE 25 MG PO TABS: 25.0000 mg | ORAL_TABLET | Freq: Every day | ORAL | 0 refills | Status: DC

## 2017-07-04 NOTE — Patient Instructions (Signed)

## 2017-07-04 NOTE — Progress Notes (Signed)
Name: Jaclyn Fields   MRN: [id number]    DOB: [birth year]   Date:07/04/2017       Progress Note  Subjective  Chief Complaint  Chief Complaint  Patient presents with  . Hypertension    Hypertension  This is a chronic problem. The current episode started more than 1 year ago. The problem has been waxing and waning since onset. The problem is controlled. Pertinent negatives include no anxiety, blurred vision, chest pain, headaches, malaise/fatigue, neck pain, orthopnea, palpitations, peripheral edema, PND, shortness of breath or sweats. There are no associated agents to hypertension. There are no known risk factors for coronary artery disease. Past treatments include beta blockers and diuretics. The current treatment provides moderate improvement. There are no compliance problems.  There is no history of angina, kidney disease, CAD/MI, CVA, heart failure, left ventricular hypertrophy, PVD or retinopathy. There is no history of chronic renal disease, a hypertension causing med or renovascular disease.  URI   This is a new problem. The current episode started in the past 7 days. There has been no fever. Associated symptoms include congestion and sneezing. Pertinent negatives include no abdominal pain, chest pain, coughing, diarrhea, dysuria, ear pain, headaches, joint pain, nausea, neck pain, rash, sore throat or wheezing. Associated symptoms comments: malaise. She has tried nothing for the symptoms.    No problem-specific Assessment & Plan notes found for this encounter.   Past Medical History:  Diagnosis Date  . Bartholin's duct cyst    Bartholin duct cyst 2 left last one 2015  . Hypertension   . Maternal iron deficiency anemia 08/24/2011  . PP care - s/p 1C/S (breech, [facility]) 08/24/2011    Past Surgical History:  Procedure Laterality Date  . AUGMENTATION MAMMAPLASTY Bilateral 2005  . CESAREAN SECTION    . DILATION AND EVACUATION  09/05/2011   Procedure: DILATATION AND EVACUATION;  Surgeon:  Elveria Royals, MD;  Location: [facility];  Service: Gynecology;  Laterality: Bilateral;  . TUBAL LIGATION    . WISDOM TOOTH EXTRACTION      Family History  Problem Relation Age of Onset  . Hypertension Mother   . Hypertension Father   . Diabetes Father     Social History   Socioeconomic History  . Marital status: Married    Spouse name: Not on file  . Number of children: Not on file  . Years of education: Not on file  . Highest education level: Not on file  Social Needs  . Financial resource strain: Not on file  . Food insecurity - worry: Not on file  . Food insecurity - inability: Not on file  . Transportation needs - medical: Not on file  . Transportation needs - non-medical: Not on file  Occupational History  . Not on file  Tobacco Use  . Smoking status: Never Smoker  . Smokeless tobacco: Never Used  Substance and Sexual Activity  . Alcohol use: Yes    Alcohol/week: 0.0 oz    Comment: occasionally  . Drug use: No  . Sexual activity: Yes    Birth control/protection: Surgical    Comment: tubal ligation  Other Topics Concern  . Not on file  Social History Narrative  . Not on file    Allergies  Allergen Reactions  . Sulfa Antibiotics Rash    Other Reaction: SWELLING TO FINGERS    Outpatient Medications Prior to Visit  Medication Sig Dispense Refill  . cetirizine (ZYRTEC) 10 MG tablet Take 1 tablet (10 mg total) by  mouth daily. 90 tablet 3  . fluticasone (FLONASE) 50 MCG/ACT nasal spray Place 2 sprays into both nostrils daily. 16 g 12  . ibuprofen (ADVIL,MOTRIN) 800 MG tablet Take 1 tablet (800 mg total) by mouth every 8 (eight) hours as needed. 30 tablet 5  . meloxicam (MOBIC) 7.5 MG tablet TAKE 1 TABLET BY MOUTH TWICE A DAY 30 tablet 0  . hydrochlorothiazide (HYDRODIURIL) 25 MG tablet TAKE 1 TABLET (25 MG TOTAL) BY MOUTH DAILY. 90 tablet 0  . metoprolol succinate (TOPROL-XL) 100 MG 24 hr tablet TAKE 1 TABLET (100 MG TOTAL) BY MOUTH DAILY. TAKE WITH OR  IMMEDIATELY FOLLOWING A MEAL. 30 tablet 0  . hydrocortisone (ANUSOL-HC) 25 MG suppository Place 1 suppository (25 mg total) rectally at bedtime as needed for hemorrhoids or anal itching. (Patient not taking: Reported on 07/04/2017) 12 suppository 1  . fluticasone (FLONASE) 50 MCG/ACT nasal spray PLACE 2 SPRAYS INTO BOTH NOSTRILS DAILY. 16 g 5   No facility-administered medications prior to visit.     Review of Systems  Constitutional: Negative for chills, fever, malaise/fatigue and weight loss.  HENT: Positive for congestion and sneezing. Negative for ear discharge, ear pain and sore throat.   Eyes: Negative for blurred vision.  Respiratory: Negative for cough, sputum production, shortness of breath and wheezing.   Cardiovascular: Negative for chest pain, palpitations, orthopnea, leg swelling and PND.  Gastrointestinal: Negative for abdominal pain, blood in stool, constipation, diarrhea, heartburn, melena and nausea.  Genitourinary: Negative for dysuria, frequency, hematuria and urgency.  Musculoskeletal: Negative for back pain, joint pain, myalgias and neck pain.  Skin: Negative for rash.  Neurological: Negative for dizziness, tingling, sensory change, focal weakness and headaches.  Endo/Heme/Allergies: Negative for environmental allergies and polydipsia. Does not bruise/bleed easily.  Psychiatric/Behavioral: Negative for depression and suicidal ideas. The patient is not nervous/anxious and does not have insomnia.      Objective  Vitals:   07/04/17 1036  BP: 130/78  Pulse: 72  Weight: 212 lb (96.2 kg)  Height: 5\' 3"  (1.6 m)    Physical Exam  Constitutional: She is well-developed, well-nourished, and in no distress. No distress.  HENT:  Head: Normocephalic and atraumatic.  Right Ear: External ear normal.  Left Ear: External ear normal.  Nose: Nose normal.  Mouth/Throat: Oropharynx is clear and moist.  Eyes: Conjunctivae and EOM are normal. Pupils are equal, round, and reactive  to light. Right eye exhibits no discharge. Left eye exhibits no discharge.  Neck: Normal range of motion. Neck supple. No JVD present. No thyromegaly present.  Cardiovascular: Normal rate, regular rhythm, normal heart sounds and intact distal pulses. Exam reveals no gallop and no friction rub.  No murmur heard. Pulmonary/Chest: Effort normal and breath sounds normal. She has no wheezes. She has no rales.  Abdominal: Soft. Bowel sounds are normal. She exhibits no mass. There is no tenderness. There is no guarding.  Musculoskeletal: Normal range of motion. She exhibits no edema.  Lymphadenopathy:    She has no cervical adenopathy.  Neurological: She is alert. She has normal reflexes.  Skin: Skin is warm and dry. She is not diaphoretic.  Psychiatric: Mood and affect normal.  Nursing note and vitals reviewed.     Assessment & Plan  Problem List Items Addressed This Visit    None    Visit Diagnoses    Essential hypertension    -  Primary   Relevant Medications   metoprolol succinate (TOPROL-XL) 100 MG 24 hr tablet   hydrochlorothiazide (HYDRODIURIL)  25 MG tablet   Other Relevant Orders   Renal Function Panel   Viral URI       otc discussed   Obesity (BMI 35.0-39.9 without comorbidity)          Meds ordered this encounter  Medications  . metoprolol succinate (TOPROL-XL) 100 MG 24 hr tablet    Sig: Take 1 tablet (100 mg total) by mouth daily. Take with or immediately following a meal.    Dispense:  30 tablet    Refill:  0    Time for 6 month follow up  . hydrochlorothiazide (HYDRODIURIL) 25 MG tablet    Sig: Take 1 tablet (25 mg total) by mouth daily.    Dispense:  90 tablet    Refill:  0      Dr. Otilio Miu [facility]  07/04/17

## 2017-07-05 LAB — RENAL FUNCTION PANEL
Albumin: 3.9 g/dL (ref 3.5–5.5)
BUN / CREAT RATIO: 16 (ref 9–23)
BUN: 14 mg/dL (ref 6–24)
CO2: 26 mmol/L (ref 20–29)
Calcium: 9 mg/dL (ref 8.7–10.2)
Chloride: 96 mmol/L (ref 96–106)
Creatinine, Ser: 0.85 mg/dL (ref 0.57–1.00)
GFR calc Af Amer: 95 mL/min/{1.73_m2} (ref >59)
GFR, EST NON AFRICAN AMERICAN: 82 mL/min/{1.73_m2} (ref >59)
GLUCOSE: 84 mg/dL (ref 65–99)
PHOSPHORUS: 2.7 mg/dL (ref 2.5–4.5)
POTASSIUM: 4.2 mmol/L (ref 3.5–5.2)
SODIUM: 136 mmol/L (ref 134–144)

## 2017-07-21 ENCOUNTER — Ambulatory Visit (INDEPENDENT_AMBULATORY_CARE_PROVIDER_SITE_OTHER): Payer: BLUE CROSS/BLUE SHIELD | Admitting: Family Medicine

## 2017-07-21 ENCOUNTER — Encounter: Payer: Self-pay | Admitting: Family Medicine

## 2017-07-21 VITALS — BP 136/78 | HR 80 | Ht 63.0 in | Wt 211.0 lb

## 2017-07-21 DIAGNOSIS — J40 Bronchitis, not specified as acute or chronic: Secondary | ICD-10-CM

## 2017-07-21 DIAGNOSIS — J0100 Acute maxillary sinusitis, unspecified: Secondary | ICD-10-CM | POA: Diagnosis not present

## 2017-07-21 MED ORDER — FLUCONAZOLE 150 MG PO TABS: 150.0000 mg | ORAL_TABLET | Freq: Once | ORAL | 0 refills | Status: AC

## 2017-07-21 MED ORDER — GUAIFENESIN-CODEINE 100-10 MG/5ML PO SYRP: 5.0000 mL | ORAL_SOLUTION | Freq: Three times a day (TID) | ORAL | 0 refills | Status: DC | PRN

## 2017-07-21 MED ORDER — AMOXICILLIN-POT CLAVULANATE 875-125 MG PO TABS
1.0000 | ORAL_TABLET | Freq: Two times a day (BID) | ORAL | 0 refills | Status: DC
Start: 2017-07-21 — End: 2017-07-22

## 2017-07-21 NOTE — Progress Notes (Signed)
Name: Jaclyn Fields   MRN: [id number]    DOB: Aug 20, 1970   Date:07/21/2017       Progress Note  Subjective  Chief Complaint  Chief Complaint  Patient presents with  . Sinusitis    Sinusitis  This is a new problem. The current episode started in the past 7 days. The problem has been waxing and waning since onset. There has been no fever. The pain is moderate (headache/frontal). Associated symptoms include congestion, coughing, ear pain, headaches, sinus pressure, sneezing, a sore throat and swollen glands. Pertinent negatives include no chills, diaphoresis, hoarse voice, neck pain or shortness of breath. (Bloody/mucus discharge) Past treatments include oral decongestants. The treatment provided mild relief.  Cough  This is a new problem. The current episode started in the past 7 days. The problem has been gradually worsening. The cough is productive of purulent sputum. Associated symptoms include ear pain, headaches, nasal congestion, postnasal drip and a sore throat. Pertinent negatives include no chest pain, chills, fever, heartburn, hemoptysis, myalgias, rash, shortness of breath, weight loss or wheezing. The symptoms are aggravated by cold air. Treatments tried: benedryl/nsaid. The treatment provided mild relief. There is no history of environmental allergies.    No problem-specific Assessment & Plan notes found for this encounter.   Past Medical History:  Diagnosis Date  . Bartholin's duct cyst    Bartholin duct cyst 2 left last one 2015  . Hypertension   . Maternal iron deficiency anemia 08/24/2011  . PP care - s/p 1C/S (breech, [facility]) 08/24/2011    Past Surgical History:  Procedure Laterality Date  . AUGMENTATION MAMMAPLASTY Bilateral 2005  . CESAREAN SECTION    . DILATION AND EVACUATION  09/05/2011   Procedure: DILATATION AND EVACUATION;  Surgeon: Elveria Royals, MD;  Location: [facility];  Service: Gynecology;  Laterality: Bilateral;  . TUBAL LIGATION    . WISDOM TOOTH EXTRACTION       Family History  Problem Relation Age of Onset  . Hypertension Mother   . Hypertension Father   . Diabetes Father     Social History   Socioeconomic History  . Marital status: Married    Spouse name: Not on file  . Number of children: Not on file  . Years of education: Not on file  . Highest education level: Not on file  Social Needs  . Financial resource strain: Not on file  . Food insecurity - worry: Not on file  . Food insecurity - inability: Not on file  . Transportation needs - medical: Not on file  . Transportation needs - non-medical: Not on file  Occupational History  . Not on file  Tobacco Use  . Smoking status: Never Smoker  . Smokeless tobacco: Never Used  Substance and Sexual Activity  . Alcohol use: Yes    Alcohol/week: 0.0 oz    Comment: occasionally  . Drug use: No  . Sexual activity: Yes    Birth control/protection: Surgical    Comment: tubal ligation  Other Topics Concern  . Not on file  Social History Narrative  . Not on file    Allergies  Allergen Reactions  . Sulfa Antibiotics Rash    Other Reaction: SWELLING TO FINGERS    Outpatient Medications Prior to Visit  Medication Sig Dispense Refill  . cetirizine (ZYRTEC) 10 MG tablet Take 1 tablet (10 mg total) by mouth daily. 90 tablet 3  . fluticasone (FLONASE) 50 MCG/ACT nasal spray Place 2 sprays into both nostrils daily. 16 g  12  . hydrochlorothiazide (HYDRODIURIL) 25 MG tablet Take 1 tablet (25 mg total) by mouth daily. 90 tablet 0  . ibuprofen (ADVIL,MOTRIN) 800 MG tablet Take 1 tablet (800 mg total) by mouth every 8 (eight) hours as needed. 30 tablet 5  . meloxicam (MOBIC) 7.5 MG tablet TAKE 1 TABLET BY MOUTH TWICE A DAY 30 tablet 0  . metoprolol succinate (TOPROL-XL) 100 MG 24 hr tablet Take 1 tablet (100 mg total) by mouth daily. Take with or immediately following a meal. 30 tablet 0  . hydrocortisone (ANUSOL-HC) 25 MG suppository Place 1 suppository (25 mg total) rectally at bedtime  as needed for hemorrhoids or anal itching. (Patient not taking: Reported on 07/21/2017) 12 suppository 1   No facility-administered medications prior to visit.     Review of Systems  Constitutional: Negative for chills, diaphoresis, fever, malaise/fatigue and weight loss.  HENT: Positive for congestion, ear pain, postnasal drip, sinus pressure, sneezing and sore throat. Negative for ear discharge and hoarse voice.   Eyes: Negative for blurred vision.  Respiratory: Positive for cough. Negative for hemoptysis, sputum production, shortness of breath and wheezing.   Cardiovascular: Negative for chest pain, palpitations and leg swelling.  Gastrointestinal: Negative for abdominal pain, blood in stool, constipation, diarrhea, heartburn, melena and nausea.  Genitourinary: Negative for dysuria, frequency, hematuria and urgency.  Musculoskeletal: Negative for back pain, joint pain, myalgias and neck pain.  Skin: Negative for rash.  Neurological: Positive for headaches. Negative for dizziness, tingling, sensory change and focal weakness.  Endo/Heme/Allergies: Negative for environmental allergies and polydipsia. Does not bruise/bleed easily.  Psychiatric/Behavioral: Negative for depression and suicidal ideas. The patient is not nervous/anxious and does not have insomnia.      Objective  Vitals:   07/21/17 1030  BP: 136/78  Pulse: 80  Weight: 211 lb (95.7 kg)  Height: 5\' 3"  (1.6 m)    Physical Exam  Constitutional: She is well-developed, well-nourished, and in no distress. No distress.  HENT:  Head: Normocephalic and atraumatic.  Right Ear: Ear canal normal. There is swelling.  Left Ear: Ear canal normal. There is swelling.  Nose: No mucosal edema. Right sinus exhibits frontal sinus tenderness. Right sinus exhibits no maxillary sinus tenderness. Left sinus exhibits maxillary sinus tenderness. Left sinus exhibits no frontal sinus tenderness.  Mouth/Throat: Oropharynx is clear and moist. No  oropharyngeal exudate, posterior oropharyngeal edema or posterior oropharyngeal erythema.  Eyes: Conjunctivae and EOM are normal. Pupils are equal, round, and reactive to light. Right eye exhibits no discharge. Left eye exhibits no discharge.  Neck: Normal range of motion. Neck supple. No JVD present. No thyromegaly present.  Cardiovascular: Normal rate, regular rhythm, normal heart sounds and intact distal pulses. Exam reveals no gallop and no friction rub.  No murmur heard. Pulmonary/Chest: Effort normal and breath sounds normal. She has no wheezes. She has no rales.  Abdominal: Soft. Bowel sounds are normal. She exhibits no mass. There is no tenderness. There is no guarding.  Musculoskeletal: Normal range of motion. She exhibits no edema.  Lymphadenopathy:    She has no cervical adenopathy.  Neurological: She is alert. She has normal reflexes.  Skin: Skin is warm and dry. She is not diaphoretic.  Psychiatric: Mood and affect normal.  Nursing note and vitals reviewed.     Assessment & Plan  Problem List Items Addressed This Visit    None    Visit Diagnoses    Acute maxillary sinusitis, recurrence not specified    -  Primary  Relevant Medications   amoxicillin-clavulanate (AUGMENTIN) 875-125 MG tablet   fluconazole (DIFLUCAN) 150 MG tablet   guaiFENesin-codeine (ROBITUSSIN AC) 100-10 MG/5ML syrup   Bronchitis       Relevant Medications   amoxicillin-clavulanate (AUGMENTIN) 875-125 MG tablet   guaiFENesin-codeine (ROBITUSSIN AC) 100-10 MG/5ML syrup      Meds ordered this encounter  Medications  . amoxicillin-clavulanate (AUGMENTIN) 875-125 MG tablet    Sig: Take 1 tablet by mouth 2 (two) times daily.    Dispense:  20 tablet    Refill:  0  . fluconazole (DIFLUCAN) 150 MG tablet    Sig: Take 1 tablet (150 mg total) by mouth once for 1 dose.    Dispense:  1 tablet    Refill:  0  . guaiFENesin-codeine (ROBITUSSIN AC) 100-10 MG/5ML syrup    Sig: Take 5 mLs by mouth 3  (three) times daily as needed for cough.    Dispense:  100 mL    Refill:  0      Dr. Otilio Miu [facility]  07/21/17

## 2017-07-22 ENCOUNTER — Other Ambulatory Visit: Payer: Self-pay

## 2017-07-22 ENCOUNTER — Telehealth: Payer: Self-pay | Admitting: Family Medicine

## 2017-07-22 DIAGNOSIS — J329 Chronic sinusitis, unspecified: Secondary | ICD-10-CM

## 2017-07-22 MED ORDER — AZITHROMYCIN 250 MG PO TABS: ORAL_TABLET | ORAL | 0 refills | Status: DC

## 2017-07-22 NOTE — Telephone Encounter (Signed)
Pt seen yesterday and was given amoxicillin-clavulanate (AUGMENTIN) 875-125 MG tablet [[id number]]   she says it is making her sick even while taking it with food. Wants to have something else called in. Please Advise.

## 2017-08-19 DIAGNOSIS — M7581 Other shoulder lesions, right shoulder: Secondary | ICD-10-CM | POA: Diagnosis not present

## 2017-08-23 DIAGNOSIS — M545 Low back pain: Secondary | ICD-10-CM | POA: Diagnosis not present

## 2017-08-29 ENCOUNTER — Other Ambulatory Visit: Payer: Self-pay

## 2017-09-02 DIAGNOSIS — M25511 Pain in right shoulder: Secondary | ICD-10-CM | POA: Diagnosis not present

## 2017-09-04 DIAGNOSIS — M25511 Pain in right shoulder: Secondary | ICD-10-CM | POA: Diagnosis not present

## 2017-09-11 DIAGNOSIS — M545 Low back pain: Secondary | ICD-10-CM | POA: Diagnosis not present

## 2017-09-25 ENCOUNTER — Other Ambulatory Visit: Payer: Self-pay | Admitting: Family Medicine

## 2017-09-25 DIAGNOSIS — I10 Essential (primary) hypertension: Secondary | ICD-10-CM

## 2017-10-06 DIAGNOSIS — M7918 Myalgia, other site: Secondary | ICD-10-CM | POA: Diagnosis not present

## 2017-10-06 DIAGNOSIS — M542 Cervicalgia: Secondary | ICD-10-CM | POA: Diagnosis not present

## 2017-10-30 ENCOUNTER — Encounter: Payer: Self-pay | Admitting: Obstetrics & Gynecology

## 2017-10-30 ENCOUNTER — Ambulatory Visit (INDEPENDENT_AMBULATORY_CARE_PROVIDER_SITE_OTHER): Payer: BLUE CROSS/BLUE SHIELD | Admitting: Obstetrics & Gynecology

## 2017-10-30 VITALS — BP 136/90 | Ht 63.5 in | Wt 206.0 lb

## 2017-10-30 DIAGNOSIS — R350 Frequency of micturition: Secondary | ICD-10-CM

## 2017-10-30 DIAGNOSIS — E6609 Other obesity due to excess calories: Secondary | ICD-10-CM

## 2017-10-30 DIAGNOSIS — Z01419 Encounter for gynecological examination (general) (routine) without abnormal findings: Secondary | ICD-10-CM

## 2017-10-30 DIAGNOSIS — Z6835 Body mass index (BMI) 35.0-35.9, adult: Secondary | ICD-10-CM | POA: Diagnosis not present

## 2017-10-30 DIAGNOSIS — Z9851 Tubal ligation status: Secondary | ICD-10-CM | POA: Diagnosis not present

## 2017-10-30 MED ORDER — PHENTERMINE HCL 37.5 MG PO CAPS: 37.5000 mg | ORAL_CAPSULE | ORAL | 2 refills | Status: DC

## 2017-10-30 NOTE — Progress Notes (Signed)
Jaclyn Fields October 10, 1970 [id number]   History:    47 y.o. G2P1A1L1 Married.  S/P TL.  Daughter is 48 yo, excellent reader!  RP:  Established patient presenting for annual gyn exam   HPI: Menses regular normal.  No breakthrough bleeding.  Status post tubal ligation.  No pelvic pain.  No pain with intercourse.  Normal vaginal secretions.  Urinary frequency, but no burning with micturition. Bowel movements normal.  Breasts normal.  Body mass index 35.92.  Needs to increase physical activity.  Fasting health labs with family physician.  Past medical history,surgical history, family history and social history were all reviewed and documented in the EPIC chart.  Gynecologic History Patient's last menstrual period was 10/06/2017. Contraception: tubal ligation Last Pap: 10/2016. Results were: Negative Last mammogram: 10/2016. Results were: Negative Bone Density: Never Colonoscopy: 2018  Obstetric History OB History  Gravida Para Term Preterm AB Living  2 1 1   1 1   SAB TAB Ectopic Multiple Live Births    1     1    # Outcome Date GA Lbr Len/2nd Weight Sex Delivery Anes PTL Lv  2 Term 08/23/11 [redacted]w[redacted]d  6 lb 10.9 oz (3.03 kg) F CS-LTranv Spinal  LIV  1 TAB              ROS: A ROS was performed and pertinent positives and negatives are included in the history.  GENERAL: No fevers or chills. HEENT: No change in vision, no earache, sore throat or sinus congestion. NECK: No pain or stiffness. CARDIOVASCULAR: No chest pain or pressure. No palpitations. PULMONARY: No shortness of breath, cough or wheeze. GASTROINTESTINAL: No abdominal pain, nausea, vomiting or diarrhea, melena or bright red blood per rectum. GENITOURINARY: No urinary frequency, urgency, hesitancy or dysuria. MUSCULOSKELETAL: No joint or muscle pain, no back pain, no recent trauma. DERMATOLOGIC: No rash, no itching, no lesions. ENDOCRINE: No polyuria, polydipsia, no heat or cold intolerance. No recent change in weight.  HEMATOLOGICAL: No anemia or easy bruising or bleeding. NEUROLOGIC: No headache, seizures, numbness, tingling or weakness. PSYCHIATRIC: No depression, no loss of interest in normal activity or change in sleep pattern.     Exam:   BP 136/90   Ht 5' 3.5" (1.613 m)   Wt 206 lb (93.4 kg)   LMP 10/06/2017 Comment: tubal ligation   BMI 35.92 kg/m   Body mass index is 35.92 kg/m.  General appearance : Well developed well nourished female. No acute distress HEENT: Eyes: no retinal hemorrhage or exudates,  Neck supple, trachea midline, no carotid bruits, no thyroidmegaly Lungs: Clear to auscultation, no rhonchi or wheezes, or rib retractions  Heart: Regular rate and rhythm, no murmurs or gallops Breast:Examined in sitting and supine position were symmetrical in appearance, no palpable masses or tenderness,  no skin retraction, no nipple inversion, no nipple discharge, no skin discoloration, no axillary or supraclavicular lymphadenopathy Abdomen: no palpable masses or tenderness, no rebound or guarding Extremities: no edema or skin discoloration or tenderness  Pelvic: Vulva: Normal             Vagina: No gross lesions or discharge  Cervix: No gross lesions or discharge.  Pap reflex done.  Uterus  AV, normal size, shape and consistency, non-tender and mobile  Adnexa  Without masses or tenderness  Anus: Normal  U/A: Yellow clear, nitrites negative, white blood cells 0-5, red blood cells negative, bacteria few.  Pending urine culture.   Assessment/Plan:  47 y.o. female for annual exam  1. Encounter for routine gynecological examination with Papanicolaou smear of cervix Normal gynecologic exam.  Pap reflex done today.  Breast exam normal.  Will schedule screening mammogram this month.  Health labs with family physician.  2. S/P tubal ligation  3. Urinary frequency Urine analysis mildly abnormal.  Will wait on urine culture before deciding on treatment.  4. Class 2 obesity due to excess  calories without serious comorbidity with body mass index (BMI) of 35.0 to 35.9 in adult Recommend low calorie/low carb diet such as [employer].  Increase physical activity with aerobic activities 5 times a week and weightlifting every 2 days.  Blood pressure upper normal today.  Will decrease salt in diet.  Decision to start on phentermine after counseling.  Usage, benefits and risks of medication reviewed.  Prescription sent to pharmacy.  Follow-up for weight management in 3 months.  Other orders - phentermine 37.5 MG capsule; Take 1 capsule (37.5 mg total) by mouth every morning.  Counseling on above issues and coordination of care more than 50% for 10 minutes.  [name] MD, 10:26 AM 10/30/2017

## 2017-10-31 ENCOUNTER — Other Ambulatory Visit: Payer: Self-pay | Admitting: Obstetrics & Gynecology

## 2017-10-31 ENCOUNTER — Encounter: Payer: Self-pay | Admitting: Obstetrics & Gynecology

## 2017-10-31 LAB — PAP IG W/ RFLX HPV ASCU

## 2017-10-31 MED ORDER — PHENTERMINE HCL 37.5 MG PO CAPS: 37.5000 mg | ORAL_CAPSULE | ORAL | 2 refills | Status: DC

## 2017-10-31 NOTE — Patient Instructions (Signed)
1. Encounter for routine gynecological examination with Papanicolaou smear of cervix Normal gynecologic exam.  Pap reflex done today.  Breast exam normal.  Will schedule screening mammogram this month.  Health labs with family physician.  2. S/P tubal ligation  3. Urinary frequency Urine analysis mildly abnormal.  Will wait on urine culture before deciding on treatment.  4. Class 2 obesity due to excess calories without serious comorbidity with body mass index (BMI) of 35.0 to 35.9 in adult Recommend low calorie/low carb diet such as [employer].  Increase physical activity with aerobic activities 5 times a week and weightlifting every 2 days.  Blood pressure upper normal today.  Will decrease salt in diet.  Decision to start on phentermine after counseling.  Usage, benefits and risks of medication reviewed.  Prescription sent to pharmacy.  Follow-up for weight management in 3 months.  Other orders - phentermine 37.5 MG capsule; Take 1 capsule (37.5 mg total) by mouth every morning.  Jaclyn Fields, it was a pleasure seeing you today!  I will inform you of your results as soon as they are available.   Exercising to Lose Weight Exercising can help you to lose weight. In order to lose weight through exercise, you need to do vigorous-intensity exercise. You can tell that you are exercising with vigorous intensity if you are breathing very hard and fast and cannot hold a conversation while exercising. Moderate-intensity exercise helps to maintain your current weight. You can tell that you are exercising at a moderate level if you have a higher heart rate and faster breathing, but you are still able to hold a conversation. How often should I exercise? Choose an activity that you enjoy and set realistic goals. Your health care provider can help you to make an activity plan that works for you. Exercise regularly as directed by your health care provider. This may include:  Doing resistance training twice  each week, such as: ? Push-ups. ? Sit-ups. ? Lifting weights. ? Using resistance bands.  Doing a given intensity of exercise for a given amount of time. Choose from these options: ? 150 minutes of moderate-intensity exercise every week. ? 75 minutes of vigorous-intensity exercise every week. ? A mix of moderate-intensity and vigorous-intensity exercise every week.  Children, pregnant women, people who are out of shape, people who are overweight, and older adults may need to consult a health care provider for individual recommendations. If you have any sort of medical condition, be sure to consult your health care provider before starting a new exercise program. What are some activities that can help me to lose weight?  Walking at a rate of at least 4.5 miles an hour.  Jogging or running at a rate of 5 miles per hour.  Biking at a rate of at least 10 miles per hour.  Lap swimming.  Roller-skating or in-line skating.  Cross-country skiing.  Vigorous competitive sports, such as football, basketball, and soccer.  Jumping rope.  Aerobic dancing. How can I be more active in my day-to-day activities?  Use the stairs instead of the elevator.  Take a walk during your lunch break.  If you drive, park your car farther away from work or school.  If you take public transportation, get off one stop early and walk the rest of the way.  Make all of your phone calls while standing up and walking around.  Get up, stretch, and walk around every 30 minutes throughout the day. What guidelines should I follow while exercising?  Do not exercise so much that you hurt yourself, feel dizzy, or get very short of breath.  Consult your health care provider prior to starting a new exercise program.  Wear comfortable clothes and shoes with good support.  Drink plenty of water while you exercise to prevent dehydration or heat stroke. Body water is lost during exercise and must be  replaced.  Work out until you breathe faster and your heart beats faster. This information is not intended to replace advice given to you by your health care provider. Make sure you discuss any questions you have with your health care provider. Document Released: 07/13/2010 Document Revised: 11/16/2015 Document Reviewed: 11/11/2013 Elsevier Interactive Patient Education  2018 [employer] Calories are units of energy. Your body needs a certain amount of calories from food to keep you going throughout the day. When you eat more calories than your body needs, your body stores the extra calories as fat. When you eat fewer calories than your body needs, your body burns fat to get the energy it needs. Calorie counting means keeping track of how many calories you eat and drink each day. Calorie counting can be helpful if you need to lose weight. If you make sure to eat fewer calories than your body needs, you should lose weight. Ask your health care provider what a healthy weight is for you. For calorie counting to work, you will need to eat the right number of calories in a day in order to lose a healthy amount of weight per week. A dietitian can help you determine how many calories you need in a day and will give you suggestions on how to reach your calorie goal.  A healthy amount of weight to lose per week is usually 1-2 lb (0.5-0.9 kg). This usually means that your daily calorie intake should be reduced by 500-750 calories.  Eating 1,200 - 1,500 calories per day can help most women lose weight.  Eating 1,500 - 1,800 calories per day can help most men lose weight.  What is my plan? My goal is to have __________ calories per day. If I have this many calories per day, I should lose around __________ pounds per week. What do I need to know about calorie counting? In order to meet your daily calorie goal, you will need to:  Find out how many calories are in each  food you would like to eat. Try to do this before you eat.  Decide how much of the food you plan to eat.  Write down what you ate and how many calories it had. Doing this is called keeping a food log.  To successfully lose weight, it is important to balance calorie counting with a healthy lifestyle that includes regular activity. Aim for 150 minutes of moderate exercise (such as walking) or 75 minutes of vigorous exercise (such as running) each week. Where do I find calorie information?  The number of calories in a food can be found on a Nutrition Facts label. If a food does not have a Nutrition Facts label, try to look up the calories online or ask your dietitian for help. Remember that calories are listed per serving. If you choose to have more than one serving of a food, you will have to multiply the calories per serving by the amount of servings you plan to eat. For example, the label on a package of bread might say that a serving size is 1 slice and  that there are 90 calories in a serving. If you eat 1 slice, you will have eaten 90 calories. If you eat 2 slices, you will have eaten 180 calories. How do I keep a food log? Immediately after each meal, record the following information in your food log:  What you ate. Don't forget to include toppings, sauces, and other extras on the food.  How much you ate. This can be measured in cups, ounces, or number of items.  How many calories each food and drink had.  The total number of calories in the meal.  Keep your food log near you, such as in a small notebook in your pocket, or use a mobile app or website. Some programs will calculate calories for you and show you how many calories you have left for the day to meet your goal. What are some calorie counting tips?  Use your calories on foods and drinks that will fill you up and not leave you hungry: ? Some examples of foods that fill you up are nuts and nut butters, vegetables, lean proteins,  and high-fiber foods like whole grains. High-fiber foods are foods with more than 5 g fiber per serving. ? Drinks such as sodas, specialty coffee drinks, alcohol, and juices have a lot of calories, yet do not fill you up.  Eat nutritious foods and avoid empty calories. Empty calories are calories you get from foods or beverages that do not have many vitamins or protein, such as candy, sweets, and soda. It is better to have a nutritious high-calorie food (such as an avocado) than a food with few nutrients (such as a bag of chips).  Know how many calories are in the foods you eat most often. This will help you calculate calorie counts faster.  Pay attention to calories in drinks. Low-calorie drinks include water and unsweetened drinks.  Pay attention to nutrition labels for "low fat" or "fat free" foods. These foods sometimes have the same amount of calories or more calories than the full fat versions. They also often have added sugar, starch, or salt, to make up for flavor that was removed with the fat.  Find a way of tracking calories that works for you. Get creative. Try different apps or programs if writing down calories does not work for you. What are some portion control tips?  Know how many calories are in a serving. This will help you know how many servings of a certain food you can have.  Use a measuring cup to measure serving sizes. You could also try weighing out portions on a kitchen scale. With time, you will be able to estimate serving sizes for some foods.  Take some time to put servings of different foods on your favorite plates, bowls, and cups so you know what a serving looks like.  Try not to eat straight from a bag or box. Doing this can lead to overeating. Put the amount you would like to eat in a cup or on a plate to make sure you are eating the right portion.  Use smaller plates, glasses, and bowls to prevent overeating.  Try not to multitask (for example, watch TV or use  your computer) while eating. If it is time to eat, sit down at a table and enjoy your food. This will help you to know when you are full. It will also help you to be aware of what you are eating and how much you are eating. What are tips for following  this plan? Reading food labels  Check the calorie count compared to the serving size. The serving size may be smaller than what you are used to eating.  Check the source of the calories. Make sure the food you are eating is high in vitamins and protein and low in saturated and trans fats. Shopping  Read nutrition labels while you shop. This will help you make healthy decisions before you decide to purchase your food.  Make a grocery list and stick to it. Cooking  Try to cook your favorite foods in a healthier way. For example, try baking instead of frying.  Use low-fat dairy products. Meal planning  Use more fruits and vegetables. Half of your plate should be fruits and vegetables.  Include lean proteins like poultry and fish. How do I count calories when eating out?  Ask for smaller portion sizes.  Consider sharing an entree and sides instead of getting your own entree.  If you get your own entree, eat only half. Ask for a box at the beginning of your meal and put the rest of your entree in it so you are not tempted to eat it.  If calories are listed on the menu, choose the lower calorie options.  Choose dishes that include vegetables, fruits, whole grains, low-fat dairy products, and lean protein.  Choose items that are boiled, broiled, grilled, or steamed. Stay away from items that are buttered, battered, fried, or served with cream sauce. Items labeled "crispy" are usually fried, unless stated otherwise.  Choose water, low-fat milk, unsweetened iced tea, or other drinks without added sugar. If you want an alcoholic beverage, choose a lower calorie option such as a glass of wine or light beer.  Ask for dressings, sauces, and  syrups on the side. These are usually high in calories, so you should limit the amount you eat.  If you want a salad, choose a garden salad and ask for grilled meats. Avoid extra toppings like bacon, cheese, or fried items. Ask for the dressing on the side, or ask for olive oil and vinegar or lemon to use as dressing.  Estimate how many servings of a food you are given. For example, a serving of cooked rice is  cup or about the size of half a baseball. Knowing serving sizes will help you be aware of how much food you are eating at restaurants. The list below tells you how big or small some common portion sizes are based on everyday objects: ? 1 oz-4 stacked dice. ? 3 oz-1 deck of cards. ? 1 tsp-1 die. ? 1 Tbsp- a ping-pong ball. ? 2 Tbsp-1 ping-pong ball. ?  cup- baseball. ? 1 cup-1 baseball. Summary  Calorie counting means keeping track of how many calories you eat and drink each day. If you eat fewer calories than your body needs, you should lose weight.  A healthy amount of weight to lose per week is usually 1-2 lb (0.5-0.9 kg). This usually means reducing your daily calorie intake by 500-750 calories.  The number of calories in a food can be found on a Nutrition Facts label. If a food does not have a Nutrition Facts label, try to look up the calories online or ask your dietitian for help.  Use your calories on foods and drinks that will fill you up, and not on foods and drinks that will leave you hungry.  Use smaller plates, glasses, and bowls to prevent overeating. This information is not intended to replace advice given  to you by your health care provider. Make sure you discuss any questions you have with your health care provider. Document Released: 06/10/2005 Document Revised: 05/10/2016 Document Reviewed: 05/10/2016 Elsevier Interactive Patient Education  [employer].

## 2017-11-02 LAB — URINE CULTURE
MICRO NUMBER:: 90572691
SPECIMEN QUALITY:: ADEQUATE

## 2017-11-02 LAB — URINALYSIS, COMPLETE W/RFL CULTURE
Bilirubin Urine: NEGATIVE
Glucose, UA: NEGATIVE
Hgb urine dipstick: NEGATIVE
Hyaline Cast: NONE SEEN /LPF
Ketones, ur: NEGATIVE
LEUKOCYTE ESTERASE: NEGATIVE
Nitrites, Initial: NEGATIVE
RBC / HPF: NONE SEEN /HPF (ref 0–2)
Specific Gravity, Urine: 1.017 (ref 1.001–1.03)
pH: 5.5 (ref 5.0–8.0)

## 2017-11-02 LAB — CULTURE INDICATED

## 2017-11-03 ENCOUNTER — Other Ambulatory Visit: Payer: Self-pay

## 2017-11-03 ENCOUNTER — Other Ambulatory Visit: Payer: Self-pay | Admitting: Obstetrics & Gynecology

## 2017-11-03 MED ORDER — AMPICILLIN 500 MG PO CAPS: 500.0000 mg | ORAL_CAPSULE | Freq: Two times a day (BID) | ORAL | 0 refills | Status: DC

## 2018-01-02 ENCOUNTER — Ambulatory Visit (INDEPENDENT_AMBULATORY_CARE_PROVIDER_SITE_OTHER): Payer: BLUE CROSS/BLUE SHIELD | Admitting: Family Medicine

## 2018-01-02 ENCOUNTER — Encounter: Payer: Self-pay | Admitting: Family Medicine

## 2018-01-02 VITALS — BP 140/81 | HR 84 | Resp 16 | Ht 63.0 in | Wt 209.0 lb

## 2018-01-02 DIAGNOSIS — I10 Essential (primary) hypertension: Secondary | ICD-10-CM

## 2018-01-02 DIAGNOSIS — J301 Allergic rhinitis due to pollen: Secondary | ICD-10-CM

## 2018-01-02 MED ORDER — FLUTICASONE PROPIONATE 50 MCG/ACT NA SUSP: 2.0000 | Freq: Every day | NASAL | 12 refills | Status: DC

## 2018-01-02 MED ORDER — HYDROCHLOROTHIAZIDE 25 MG PO TABS: 25.0000 mg | ORAL_TABLET | Freq: Every day | ORAL | 1 refills | Status: DC

## 2018-01-02 MED ORDER — METOPROLOL SUCCINATE ER 100 MG PO TB24: 100.0000 mg | ORAL_TABLET | Freq: Every day | ORAL | 1 refills | Status: DC

## 2018-01-02 NOTE — Assessment & Plan Note (Signed)
Chronic Controlled Continue Metoprolol XL 100MG  and HCTZ 25 MG daily. Encourage weight loss. Will check renal panel at next visit in 6 months.

## 2018-01-02 NOTE — Progress Notes (Signed)
Name: Jaclyn Fields   MRN: [id number]    DOB: [birth year]   Date:01/02/2018       Progress Note  Subjective  Chief Complaint  Chief Complaint  Patient presents with  . Hypertension    Hypertension  This is a chronic problem. The current episode started more than 1 year ago. The problem is unchanged. The problem is controlled. Pertinent negatives include no anxiety, blurred vision, chest pain, headaches, malaise/fatigue, neck pain, orthopnea, palpitations, peripheral edema, PND, shortness of breath or sweats. There are no associated agents to hypertension. Past treatments include beta blockers and diuretics. The current treatment provides moderate improvement. There are no compliance problems.  There is no history of angina, kidney disease, CAD/MI, CVA, heart failure, left ventricular hypertrophy, PVD or retinopathy. There is no history of chronic renal disease, a hypertension causing med or renovascular disease.    Essential hypertension Chronic Controlled Continue Metoprolol XL 100MG  and HCTZ 25 MG daily. Encourage weight loss. Will check renal panel at next visit in 6 months.   Past Medical History:  Diagnosis Date  . Bartholin's duct cyst    Bartholin duct cyst 2 left last one 2015  . Hypertension   . Maternal iron deficiency anemia 08/24/2011  . PP care - s/p 1C/S (breech, [facility]) 08/24/2011    Past Surgical History:  Procedure Laterality Date  . AUGMENTATION MAMMAPLASTY Bilateral 2005  . CESAREAN SECTION    . DILATION AND EVACUATION  09/05/2011   Procedure: DILATATION AND EVACUATION;  Surgeon: Elveria Royals, MD;  Location: [facility];  Service: Gynecology;  Laterality: Bilateral;  . TUBAL LIGATION    . WISDOM TOOTH EXTRACTION      Family History  Problem Relation Age of Onset  . Hypertension Mother   . Hypertension Father   . Diabetes Father     Social History   Socioeconomic History  . Marital status: Married    Spouse name: Not on file  . Number of children: Not on  file  . Years of education: Not on file  . Highest education level: Not on file  Occupational History  . Not on file  Social Needs  . Financial resource strain: Not on file  . Food insecurity:    Worry: Not on file    Inability: Not on file  . Transportation needs:    Medical: Not on file    Non-medical: Not on file  Tobacco Use  . Smoking status: Never Smoker  . Smokeless tobacco: Never Used  Substance and Sexual Activity  . Alcohol use: Yes    Comment: occasionally  . Drug use: No  . Sexual activity: Yes    Partners: Male    Birth control/protection: Surgical    Comment: tubal ligation  Lifestyle  . Physical activity:    Days per week: Not on file    Minutes per session: Not on file  . Stress: Not on file  Relationships  . Social connections:    Talks on phone: Not on file    Gets together: Not on file    Attends religious service: Not on file    Active member of club or organization: Not on file    Attends meetings of clubs or organizations: Not on file    Relationship status: Not on file  . Intimate partner violence:    Fear of current or ex partner: Not on file    Emotionally abused: Not on file    Physically abused: Not on file  Forced sexual activity: Not on file  Other Topics Concern  . Not on file  Social History Narrative  . Not on file    Allergies  Allergen Reactions  . Other Other (See Comments)    Other Reaction: SWELLING TO FINGERS  . Sulfa Antibiotics Rash    Other Reaction: SWELLING TO FINGERS    Outpatient Medications Prior to Visit  Medication Sig Dispense Refill  . cetirizine (ZYRTEC) 10 MG tablet Take 1 tablet (10 mg total) by mouth daily. 90 tablet 3  . hydrocortisone (ANUSOL-HC) 25 MG suppository Place 1 suppository (25 mg total) rectally at bedtime as needed for hemorrhoids or anal itching. 12 suppository 1  . meloxicam (MOBIC) 7.5 MG tablet TAKE 1 TABLET BY MOUTH TWICE A DAY 30 tablet 0  . fluticasone (FLONASE) 50 MCG/ACT nasal  spray Place 2 sprays into both nostrils daily. 16 g 12  . hydrochlorothiazide (HYDRODIURIL) 25 MG tablet TAKE 1 TABLET BY MOUTH EVERY DAY 90 tablet 0  . metoprolol succinate (TOPROL-XL) 100 MG 24 hr tablet Take 1 tablet (100 mg total) by mouth daily. Take with or immediately following a meal. 30 tablet 0  . phentermine 37.5 MG capsule Take 1 capsule (37.5 mg total) by mouth every morning. (Patient not taking: Reported on 01/02/2018) 30 capsule 2  . ampicillin (PRINCIPEN) 500 MG capsule Take 1 capsule (500 mg total) by mouth 2 (two) times daily with a meal. 14 capsule 0   No facility-administered medications prior to visit.     Review of Systems  Constitutional: Negative for chills, fever, malaise/fatigue and weight loss.  HENT: Negative for ear discharge, ear pain and sore throat.   Eyes: Negative for blurred vision.  Respiratory: Negative for cough, sputum production, shortness of breath and wheezing.   Cardiovascular: Negative for chest pain, palpitations, orthopnea, leg swelling and PND.  Gastrointestinal: Negative for abdominal pain, blood in stool, constipation, diarrhea, heartburn, melena and nausea.  Genitourinary: Negative for dysuria, frequency, hematuria and urgency.  Musculoskeletal: Negative for back pain, joint pain, myalgias and neck pain.  Skin: Negative for rash.  Neurological: Negative for dizziness, tingling, sensory change, focal weakness and headaches.  Endo/Heme/Allergies: Negative for environmental allergies and polydipsia. Does not bruise/bleed easily.  Psychiatric/Behavioral: Negative for depression and suicidal ideas. The patient is not nervous/anxious and does not have insomnia.      Objective  Vitals:   01/02/18 1411  BP: 140/81  Pulse: 84  Resp: 16  Weight: 209 lb (94.8 kg)  Height: 5\' 3"  (1.6 m)    Physical Exam  Constitutional: She is oriented to person, place, and time. She appears well-developed and well-nourished.  HENT:  Head: Normocephalic.   Right Ear: External ear normal.  Left Ear: External ear normal.  Mouth/Throat: Oropharynx is clear and moist.  Eyes: Pupils are equal, round, and reactive to light. Conjunctivae and EOM are normal. Lids are everted and swept, no foreign bodies found. Left eye exhibits no hordeolum. No foreign body present in the left eye. Right conjunctiva is not injected. Left conjunctiva is not injected. No scleral icterus.  Neck: Normal range of motion. Neck supple. No JVD present. No tracheal deviation present. No thyromegaly present.  Cardiovascular: Normal rate, regular rhythm, normal heart sounds and intact distal pulses. Exam reveals no gallop and no friction rub.  No murmur heard. Pulmonary/Chest: Effort normal and breath sounds normal. No respiratory distress. She has no wheezes. She has no rales.  Abdominal: Soft. Bowel sounds are normal. She exhibits no mass.  There is no hepatosplenomegaly. There is no tenderness. There is no rebound and no guarding.  Musculoskeletal: Normal range of motion. She exhibits no edema or tenderness.  Lymphadenopathy:    She has no cervical adenopathy.  Neurological: She is alert and oriented to person, place, and time. She has normal strength. She displays normal reflexes. No cranial nerve deficit.  Skin: Skin is warm. No rash noted.  Psychiatric: She has a normal mood and affect. Her mood appears not anxious. She does not exhibit a depressed mood.      Assessment & Plan  Problem List Items Addressed This Visit      Cardiovascular and Mediastinum   Essential hypertension - Primary    Chronic Controlled Continue Metoprolol XL 100MG  and HCTZ 25 MG daily. Encourage weight loss. Will check renal panel at next visit in 6 months.      Relevant Medications   hydrochlorothiazide (HYDRODIURIL) 25 MG tablet   metoprolol succinate (TOPROL-XL) 100 MG 24 hr tablet    Other Visit Diagnoses    Seasonal allergic rhinitis due to pollen       Stable Will continue Flonase on  a prn basis.   Relevant Medications   fluticasone (FLONASE) 50 MCG/ACT nasal spray      Meds ordered this encounter  Medications  . fluticasone (FLONASE) 50 MCG/ACT nasal spray    Sig: Place 2 sprays into both nostrils daily.    Dispense:  16 g    Refill:  12  . hydrochlorothiazide (HYDRODIURIL) 25 MG tablet    Sig: Take 1 tablet (25 mg total) by mouth daily.    Dispense:  90 tablet    Refill:  1  . metoprolol succinate (TOPROL-XL) 100 MG 24 hr tablet    Sig: Take 1 tablet (100 mg total) by mouth daily. Take with or immediately following a meal.    Dispense:  90 tablet    Refill:  1    Time for 6 month follow up      Dr. Macon [facility]  01/02/18

## 2018-01-29 ENCOUNTER — Encounter: Payer: Self-pay | Admitting: Obstetrics & Gynecology

## 2018-01-29 ENCOUNTER — Ambulatory Visit (INDEPENDENT_AMBULATORY_CARE_PROVIDER_SITE_OTHER): Payer: BLUE CROSS/BLUE SHIELD | Admitting: Obstetrics & Gynecology

## 2018-01-29 VITALS — BP 140/90

## 2018-01-29 DIAGNOSIS — N898 Other specified noninflammatory disorders of vagina: Secondary | ICD-10-CM

## 2018-01-29 DIAGNOSIS — R350 Frequency of micturition: Secondary | ICD-10-CM | POA: Diagnosis not present

## 2018-01-29 DIAGNOSIS — R809 Proteinuria, unspecified: Secondary | ICD-10-CM | POA: Diagnosis not present

## 2018-01-29 LAB — COMPREHENSIVE METABOLIC PANEL
AG RATIO: 1.2 (calc) (ref 1.0–2.5)
ALT: 14 U/L (ref 6–29)
AST: 13 U/L (ref 10–35)
Albumin: 3.7 g/dL (ref 3.6–5.1)
Alkaline phosphatase (APISO): 89 U/L (ref 33–115)
BILIRUBIN TOTAL: 0.3 mg/dL (ref 0.2–1.2)
BUN: 19 mg/dL (ref 7–25)
CALCIUM: 8.9 mg/dL (ref 8.6–10.2)
CO2: 29 mmol/L (ref 20–32)
Chloride: 101 mmol/L (ref 98–110)
Creat: 0.83 mg/dL (ref 0.50–1.10)
Globulin: 3.2 g/dL (calc) (ref 1.9–3.7)
Glucose, Bld: 102 mg/dL — ABNORMAL HIGH (ref 65–99)
Potassium: 3.7 mmol/L (ref 3.5–5.3)
Sodium: 137 mmol/L (ref 135–146)
Total Protein: 6.9 g/dL (ref 6.1–8.1)

## 2018-01-29 LAB — WET PREP FOR TRICH, YEAST, CLUE

## 2018-01-29 MED ORDER — NITROFURANTOIN MONOHYD MACRO 100 MG PO CAPS: 100.0000 mg | ORAL_CAPSULE | Freq: Two times a day (BID) | ORAL | 0 refills | Status: AC

## 2018-01-29 NOTE — Progress Notes (Signed)
    Jaclyn Fields Dec 31, 1970 [id number]        47 y.o.  G2P1011 Married.  S/P TL.  RP: Urinary frequency/urgency and pelvic pressure x a few days  HPI: Urinary frequency and urgency with suprapubic pressure for a few days.  No blood in urine.  No fever.  No increased vaginal discharge.  No pain with intercourse.  Menstrual periods normal every months with last menstrual period January 22, 2018.   OB History  Gravida Para Term Preterm AB Living  '2 1 1   1 1  '$ SAB TAB Ectopic Multiple Live Births    1     1    # Outcome Date GA Lbr Len/2nd Weight Sex Delivery Anes PTL Lv  2 Term 08/23/11 103w0d 6 lb 10.9 oz (3.03 kg) F CS-LTranv Spinal  LIV  1 TAB             Past medical history,surgical history, problem list, medications, allergies, family history and social history were all reviewed and documented in the EPIC chart.   Directed ROS with pertinent positives and negatives documented in the history of present illness/assessment and plan.  Exam:  Vitals:   01/29/18 0951  BP: 140/90   General appearance:  Normal  Abdomen: Normal  CVAT negative bilaterally  Gynecologic exam: Vulva normal.  Stable Lt Bartholin Gland Cyst.  Speculum:  Cervix/Vagina normal.  Normal secretions.  Wet prep done.  Bimanual exam:  Uterus AV, normal volume, mobile, NT.  No adnexal mass, NT.  Wet prep negative  U/A: Yellow clear, nitrites negative, proteins 2+, white blood cells 0-5, red blood cells negative, bacteria few.  Urine culture pending.  CMP: Normal with normal creatinine in April 2018.   Assessment/Plan:  47y.o. G2P1011   1. Frequency of urination Urine analysis showing mild white blood cells and few bacteria.  Proteins 2+.  Will treat with MacroBID twice a day for 7 days.  Usage, risks and benefits reviewed with patient.  Pending urine culture. - Urinalysis,Complete w/RFL Culture  2. Proteinuria, unspecified type Recommend decreasing protein intake and nutrition.  Increase hydration.   Will do complete metabolic profile today.  Repeat urine analysis to reevaluate for proteins in 2 weeks. - Comp Met (CMET) - Urinalysis,Complete w/RFL Culture; Future  Other orders - nitrofurantoin, macrocrystal-monohydrate, (MACROBID) 100 MG capsule; Take 1 capsule (100 mg total) by mouth 2 (two) times daily for 7 days.  Counseling on above issues and coordination of care more than 50% for 25 minutes.  MPrincess BruinsMD, 10:06 AM 01/29/2018

## 2018-01-30 ENCOUNTER — Encounter: Payer: Self-pay | Admitting: Obstetrics & Gynecology

## 2018-01-30 NOTE — Patient Instructions (Signed)
1. Frequency of urination Urine analysis showing mild white blood cells and few bacteria.  Proteins 2+.  Will treat with MacroBID twice a day for 7 days.  Usage, risks and benefits reviewed with patient.  Pending urine culture. - Urinalysis,Complete w/RFL Culture  2. Proteinuria, unspecified type Recommend decreasing protein intake and nutrition.  Increase hydration.  Will do complete metabolic profile today.  Repeat urine analysis to reevaluate for proteins in 2 weeks. - Comp Met (CMET) - Urinalysis,Complete w/RFL Culture; Future  Other orders - nitrofurantoin, macrocrystal-monohydrate, (MACROBID) 100 MG capsule; Take 1 capsule (100 mg total) by mouth 2 (two) times daily for 7 days.  Jaclyn Fields, it was a pleasure seeing you today!  I will inform you of your results as soon as they are available.

## 2018-01-31 LAB — URINE CULTURE
MICRO NUMBER:: 90945296
SPECIMEN QUALITY:: ADEQUATE

## 2018-01-31 LAB — URINALYSIS, COMPLETE W/RFL CULTURE
BILIRUBIN URINE: NEGATIVE
Glucose, UA: NEGATIVE
Hgb urine dipstick: NEGATIVE
Hyaline Cast: NONE SEEN /LPF
Ketones, ur: NEGATIVE
Leukocyte Esterase: NEGATIVE
Nitrites, Initial: NEGATIVE
PH: 6 (ref 5.0–8.0)
RBC / HPF: NONE SEEN /HPF (ref 0–2)
SPECIFIC GRAVITY, URINE: 1.022 (ref 1.001–1.03)

## 2018-01-31 LAB — CULTURE INDICATED

## 2018-04-01 ENCOUNTER — Encounter: Payer: Self-pay | Admitting: Family Medicine

## 2018-04-01 ENCOUNTER — Ambulatory Visit (INDEPENDENT_AMBULATORY_CARE_PROVIDER_SITE_OTHER): Payer: BLUE CROSS/BLUE SHIELD | Admitting: Family Medicine

## 2018-04-01 VITALS — BP 123/81 | HR 78 | Temp 98.1°F | Resp 16 | Ht 63.0 in | Wt 210.0 lb

## 2018-04-01 DIAGNOSIS — J0100 Acute maxillary sinusitis, unspecified: Secondary | ICD-10-CM

## 2018-04-01 DIAGNOSIS — J40 Bronchitis, not specified as acute or chronic: Secondary | ICD-10-CM

## 2018-04-01 DIAGNOSIS — M1990 Unspecified osteoarthritis, unspecified site: Secondary | ICD-10-CM

## 2018-04-01 MED ORDER — AZITHROMYCIN 250 MG PO TABS: ORAL_TABLET | ORAL | 0 refills | Status: DC

## 2018-04-01 MED ORDER — IBUPROFEN 800 MG PO TABS: 800.0000 mg | ORAL_TABLET | Freq: Three times a day (TID) | ORAL | 1 refills | Status: DC | PRN

## 2018-04-01 MED ORDER — GUAIFENESIN-CODEINE 100-10 MG/5ML PO SYRP: 5.0000 mL | ORAL_SOLUTION | Freq: Three times a day (TID) | ORAL | 0 refills | Status: DC | PRN

## 2018-04-01 NOTE — Progress Notes (Signed)
Date:  04/01/2018   Name:  Jaclyn Fields   DOB:  1970-09-04   MRN:  [id number]   Chief Complaint: Sinus Problem (headache and sinus pain and facial pain and neck pain x 2 weeks ) Sinus Problem  This is a new problem. The current episode started in the past 7 days. The problem is unchanged. There has been no fever. Her pain is at a severity of 4/10 (facial and frontal). The pain is mild. Associated symptoms include congestion, coughing, ear pain, headaches, sinus pressure, sneezing and a sore throat. Pertinent negatives include no chills, diaphoresis, hoarse voice, neck pain, shortness of breath or swollen glands. Past treatments include acetaminophen. The treatment provided no relief.     Review of Systems  Constitutional: Negative.  Negative for chills, diaphoresis, fatigue, fever and unexpected weight change.  HENT: Positive for congestion, ear pain, sinus pressure, sneezing and sore throat. Negative for ear discharge, hoarse voice and rhinorrhea.   Eyes: Negative for photophobia, pain, discharge, redness and itching.  Respiratory: Positive for cough. Negative for shortness of breath, wheezing and stridor.   Gastrointestinal: Negative for abdominal pain, blood in stool, constipation, diarrhea, nausea and vomiting.  Endocrine: Negative for cold intolerance, heat intolerance, polydipsia, polyphagia and polyuria.  Genitourinary: Negative for dysuria, flank pain, frequency, hematuria, menstrual problem, pelvic pain, urgency, vaginal bleeding and vaginal discharge.  Musculoskeletal: Negative for arthralgias, back pain, myalgias and neck pain.  Skin: Negative for rash.  Allergic/Immunologic: Negative for environmental allergies and food allergies.  Neurological: Positive for headaches. Negative for dizziness, weakness, light-headedness and numbness.  Hematological: Negative for adenopathy. Does not bruise/bleed easily.  Psychiatric/Behavioral: Negative for dysphoric mood. The patient is not  nervous/anxious.     Patient Active Problem List   Diagnosis Date Noted  . Essential hypertension 01/02/2018  . Transfusion history 08/12/2012  . Postpartum hemorrhage 09/05/2011  . PP care - s/p 1C/S 3/1 (breech, [facility]) 08/24/2011  . Maternal iron deficiency anemia 08/24/2011  . Benign essential hypertension complicating pregnancy, childbirth, and the puerperium 08/23/2011    Allergies  Allergen Reactions  . Other Other (See Comments)    Other Reaction: SWELLING TO FINGERS  . Sulfa Antibiotics Rash    Other Reaction: SWELLING TO FINGERS    Past Surgical History:  Procedure Laterality Date  . AUGMENTATION MAMMAPLASTY Bilateral 2005  . CESAREAN SECTION    . DILATION AND EVACUATION  09/05/2011   Procedure: DILATATION AND EVACUATION;  Surgeon: Elveria Royals, MD;  Location: [facility];  Service: Gynecology;  Laterality: Bilateral;  . TUBAL LIGATION    . WISDOM TOOTH EXTRACTION      Social History   Tobacco Use  . Smoking status: Never Smoker  . Smokeless tobacco: Never Used  Substance Use Topics  . Alcohol use: Yes    Comment: occasionally  . Drug use: No     Medication list has been reviewed and updated.  Current Meds  Medication Sig  . cetirizine (ZYRTEC) 10 MG tablet Take 1 tablet (10 mg total) by mouth daily.  . fluticasone (FLONASE) 50 MCG/ACT nasal spray Place 2 sprays into both nostrils daily.  . hydrochlorothiazide (HYDRODIURIL) 25 MG tablet Take 1 tablet (25 mg total) by mouth daily.  . meloxicam (MOBIC) 7.5 MG tablet TAKE 1 TABLET BY MOUTH TWICE A DAY  . metoprolol succinate (TOPROL-XL) 100 MG 24 hr tablet Take 1 tablet (100 mg total) by mouth daily. Take with or immediately following a meal.    PHQ 2/9 Scores  04/01/2018 01/20/2017 09/05/2015 09/01/2015  PHQ - 2 Score 0 0 0 0    Physical Exam  Constitutional: She is oriented to person, place, and time. She appears well-developed and well-nourished.  HENT:  Head: Normocephalic.  Right Ear: External ear  normal.  Left Ear: External ear normal.  Mouth/Throat: Oropharynx is clear and moist.  Eyes: Pupils are equal, round, and reactive to light. Conjunctivae and EOM are normal. Lids are everted and swept, no foreign bodies found. Left eye exhibits no hordeolum. No foreign body present in the left eye. Right conjunctiva is not injected. Left conjunctiva is not injected. No scleral icterus.  Neck: Normal range of motion. Neck supple. No JVD present. No tracheal deviation present. No thyromegaly present.  Cardiovascular: Normal rate, regular rhythm, normal heart sounds and intact distal pulses. Exam reveals no gallop and no friction rub.  No murmur heard. Pulmonary/Chest: Effort normal and breath sounds normal. No respiratory distress. She has no wheezes. She has no rales.  Abdominal: Soft. Bowel sounds are normal. She exhibits no mass. There is no hepatosplenomegaly. There is no tenderness. There is no rebound and no guarding.  Musculoskeletal: Normal range of motion. She exhibits no edema or tenderness.  Lymphadenopathy:    She has no cervical adenopathy.  Neurological: She is alert and oriented to person, place, and time. She has normal strength. She displays normal reflexes. No cranial nerve deficit.  Skin: Skin is warm. No rash noted.  Psychiatric: She has a normal mood and affect. Her mood appears not anxious. She does not exhibit a depressed mood.  Nursing note and vitals reviewed.   BP 123/81   Pulse 78   Temp 98.1 F (36.7 C)   Resp 16   Ht 5\' 3"  (1.6 m)   Wt 210 lb (95.3 kg)   LMP 03/11/2018 (Approximate)   SpO2 98%   BMI 37.20 kg/m   Assessment and Plan:  1. Acute non-recurrent maxillary sinusitis Acute, Prescied azithromycin as directed - azithromycin (ZITHROMAX) 250 MG tablet; 2 today then 1 a day for 4 days  Dispense: 6 tablet; Refill: 0  2. Bronchitis Nonproductive at night Robitussin AC 1 tso as needed at night. - guaiFENesin-codeine (ROBITUSSIN AC) 100-10 MG/5ML  syrup; Take 5 mLs by mouth 3 (three) times daily as needed for cough.  Dispense: 100 mL; Refill: 0  3. Arthritis Chronic recurrent Patient desires ibuprofen 800mg  prn arthritis flare. - ibuprofen (ADVIL,MOTRIN) 800 MG tablet; Take 1 tablet (800 mg total) by mouth every 8 (eight) hours as needed.  Dispense: 90 tablet; Refill: 1   Dr. [facility]  04/01/2018

## 2018-04-21 ENCOUNTER — Telehealth: Payer: Self-pay

## 2018-04-21 ENCOUNTER — Ambulatory Visit: Payer: BLUE CROSS/BLUE SHIELD | Admitting: Family Medicine

## 2018-04-21 ENCOUNTER — Other Ambulatory Visit: Payer: Self-pay

## 2018-04-21 ENCOUNTER — Emergency Department (HOSPITAL_COMMUNITY)
Admission: EM | Admit: 2018-04-21 | Discharge: 2018-04-21 | Disposition: A | Discharge status: Home / Self Care | Payer: BLUE CROSS/BLUE SHIELD | Attending: Emergency Medicine | Admitting: Emergency Medicine

## 2018-04-21 ENCOUNTER — Emergency Department (HOSPITAL_COMMUNITY): Payer: BLUE CROSS/BLUE SHIELD

## 2018-04-21 ENCOUNTER — Encounter (HOSPITAL_COMMUNITY): Payer: Self-pay | Admitting: Emergency Medicine

## 2018-04-21 DIAGNOSIS — R109 Unspecified abdominal pain: Secondary | ICD-10-CM | POA: Insufficient documentation

## 2018-04-21 DIAGNOSIS — Z79899 Other long term (current) drug therapy: Secondary | ICD-10-CM | POA: Diagnosis not present

## 2018-04-21 DIAGNOSIS — M545 Low back pain, unspecified: Secondary | ICD-10-CM

## 2018-04-21 DIAGNOSIS — I10 Essential (primary) hypertension: Secondary | ICD-10-CM | POA: Insufficient documentation

## 2018-04-21 LAB — BASIC METABOLIC PANEL
Anion gap: 9 (ref 5–15)
BUN: 18 mg/dL (ref 6–20)
CO2: 24 mmol/L (ref 22–32)
CREATININE: 0.89 mg/dL (ref 0.44–1.00)
Calcium: 8.9 mg/dL (ref 8.9–10.3)
Chloride: 105 mmol/L (ref 98–111)
GFR calc non Af Amer: >60 mL/min (ref >60)
GLUCOSE: 107 mg/dL — AB (ref 70–99)
Potassium: 3.6 mmol/L (ref 3.5–5.1)
Sodium: 138 mmol/L (ref 135–145)

## 2018-04-21 LAB — URINALYSIS, ROUTINE W REFLEX MICROSCOPIC
BILIRUBIN URINE: NEGATIVE
Bacteria, UA: NONE SEEN
Glucose, UA: NEGATIVE mg/dL
KETONES UR: NEGATIVE mg/dL
LEUKOCYTES UA: NEGATIVE
NITRITE: NEGATIVE
PROTEIN: 30 mg/dL — AB
Specific Gravity, Urine: 1.019 (ref 1.005–1.030)
pH: 6 (ref 5.0–8.0)

## 2018-04-21 LAB — CBC
HEMATOCRIT: 40.9 % (ref 36.0–46.0)
Hemoglobin: 13.2 g/dL (ref 12.0–15.0)
MCH: 30.1 pg (ref 26.0–34.0)
MCHC: 32.3 g/dL (ref 30.0–36.0)
MCV: 93.4 fL (ref 80.0–100.0)
Platelets: 313 10*3/uL (ref 150–400)
RBC: 4.38 MIL/uL (ref 3.87–5.11)
RDW: 11.9 % (ref 11.5–15.5)
WBC: 10.2 10*3/uL (ref 4.0–10.5)
nRBC: 0 % (ref 0.0–0.2)

## 2018-04-21 LAB — I-STAT BETA HCG BLOOD, ED (MC, WL, AP ONLY): I-stat hCG, quantitative: <5 m[IU]/mL (ref <5)

## 2018-04-21 MED ORDER — METHOCARBAMOL 500 MG PO TABS: 500.0000 mg | ORAL_TABLET | Freq: Two times a day (BID) | ORAL | 0 refills | Status: DC

## 2018-04-21 MED ORDER — KETOROLAC TROMETHAMINE 30 MG/ML IJ SOLN
30.0000 mg | Freq: Once | INTRAMUSCULAR | Status: AC
  Administered 2018-04-21: 30 mg via INTRAMUSCULAR
  Filled 2018-04-21: qty 1

## 2018-04-21 MED ORDER — OXYCODONE-ACETAMINOPHEN 5-325 MG PO TABS
1.0000 | ORAL_TABLET | Freq: Once | ORAL | Status: AC
  Administered 2018-04-21: 1 via ORAL
  Filled 2018-04-21: qty 1

## 2018-04-21 NOTE — Discharge Instructions (Addendum)
Please read attached information. If you experience any new or worsening signs or symptoms please return to the emergency room for evaluation. Please follow-up with your primary care provider or specialist as discussed. Please use medication prescribed only as directed and discontinue taking if you have any concerning signs or symptoms.   °

## 2018-04-21 NOTE — Telephone Encounter (Signed)
Spoke to pt concerning R) sided abd pain- started Sat night, hurt to lie down. Took some prescription Ibuprofen and got some relief. Started hurting again yesterday, took Ibuprofen again and was hurting "so bad I couldn't sit down and I was nauseated." Was told that she needed to go to ER for possible CT to look for kidney stone.

## 2018-04-21 NOTE — ED Triage Notes (Signed)
Pt presents to ED for assessment of right flank pain, worse when she lays down.  C/o increased urination, denies any other urinary symptoms.  C/o feeling hot last night.  States the pain was so intense last night she got nauseous.  Pain 5/10 at this time.

## 2018-04-21 NOTE — ED Notes (Signed)
Results reviewed, no changes in acuity at this time 

## 2018-04-21 NOTE — ED Provider Notes (Signed)
Patient placed in Quick Look pathway, seen and evaluated   Chief Complaint: Flank pain  HPI:   Pain in right flank area radiating to the right back which started yesterday. Pain is worse with laying flat and improved with sitting. No recent strenuous lifting or trauma to the back. She reports pain is so bad she feels nauseated, no vomiting. Endorses increased urinary frequency, no dysuria or hematuria. Has felt hot, no measured temperature. No hx of stone disease.   ROS: No vomiting  Physical Exam:   Gen: No distress  Neuro: Awake and Alert  Skin: Warm    Focused Exam: + right CVA tenderness. Abdomen soft and non-tender.    Initiation of care has begun. The patient has been counseled on the process, plan, and necessity for staying for the completion/evaluation, and the remainder of the medical screening examination    Bernarda Caffey 04/21/18 1726    Noemi Chapel, MD 04/23/18 [phone]

## 2018-04-21 NOTE — ED Notes (Signed)
ED Provider at bedside. 

## 2018-04-25 NOTE — ED Provider Notes (Signed)
[facility] EMERGENCY DEPARTMENT Provider Note   CSN: [id number] Arrival date & time: 04/21/18  [name]     History   Chief Complaint Chief Complaint  Patient presents with  . Flank Pain    HPI Jaclyn Fields is a 47 y.o. female.  HPI    32 YOF presents today with complaints of right sided flank pain. She notes that the symptoms started yesterday with a sharp sensation and the right lower back and flank. The symptoms are made worse with movement and palpation. She feels slightly nauseous without vomiting, fever, or abdominal pain. She also notes some increased urinary frequency but no dysuria. No trauma to the back or increased workload.   Past Medical History:  Diagnosis Date  . Bartholin's duct cyst    Bartholin duct cyst 2 left last one 2015  . Hypertension   . Maternal iron deficiency anemia 08/24/2011  . PP care - s/p 1C/S (breech, [facility]) 08/24/2011    Patient Active Problem List   Diagnosis Date Noted  . Essential hypertension 01/02/2018  . Transfusion history 08/12/2012  . Postpartum hemorrhage 09/05/2011  . PP care - s/p 1C/S 3/1 (breech, [facility]) 08/24/2011  . Maternal iron deficiency anemia 08/24/2011  . Benign essential hypertension complicating pregnancy, childbirth, and the puerperium 08/23/2011    Past Surgical History:  Procedure Laterality Date  . AUGMENTATION MAMMAPLASTY Bilateral 2005  . CESAREAN SECTION    . DILATION AND EVACUATION  09/05/2011   Procedure: DILATATION AND EVACUATION;  Surgeon: Elveria Royals, MD;  Location: [facility];  Service: Gynecology;  Laterality: Bilateral;  . TUBAL LIGATION    . WISDOM TOOTH EXTRACTION       OB History    Gravida  2   Para  1   Term  1   Preterm      AB  1   Living  1     SAB      TAB  1   Ectopic      Multiple      Live Births  1            Home Medications    Prior to Admission medications   Medication Sig Start Date End Date Taking? Authorizing Provider    azithromycin (ZITHROMAX) 250 MG tablet 2 today then 1 a day for 4 days 04/01/18   Juline Patch, MD  cetirizine (ZYRTEC) 10 MG tablet Take 1 tablet (10 mg total) by mouth daily. 10/11/16   Juline Patch, MD  fluticasone (FLONASE) 50 MCG/ACT nasal spray Place 2 sprays into both nostrils daily. 01/02/18   Juline Patch, MD  guaiFENesin-codeine (ROBITUSSIN AC) 100-10 MG/5ML syrup Take 5 mLs by mouth 3 (three) times daily as needed for cough. 04/01/18   Juline Patch, MD  hydrochlorothiazide (HYDRODIURIL) 25 MG tablet Take 1 tablet (25 mg total) by mouth daily. 01/02/18   Juline Patch, MD  ibuprofen (ADVIL,MOTRIN) 800 MG tablet Take 1 tablet (800 mg total) by mouth every 8 (eight) hours as needed. 04/01/18   Juline Patch, MD  meloxicam (MOBIC) 7.5 MG tablet TAKE 1 TABLET BY MOUTH TWICE A DAY 01/31/17   Juline Patch, MD  methocarbamol (ROBAXIN) 500 MG tablet Take 1 tablet (500 mg total) by mouth 2 (two) times daily. 04/21/18   Andersyn Fragoso, Dellis Filbert, PA-C  metoprolol succinate (TOPROL-XL) 100 MG 24 hr tablet Take 1 tablet (100 mg total) by mouth daily. Take with or immediately following a meal.  01/02/18   Juline Patch, MD  phentermine 37.5 MG capsule Take 1 capsule (37.5 mg total) by mouth every morning. Patient not taking: Reported on 04/01/2018 10/31/17   [name], MD    Family History Family History  Problem Relation Age of Onset  . Hypertension Mother   . Hypertension Father   . Diabetes Father     Social History Social History   Tobacco Use  . Smoking status: Never Smoker  . Smokeless tobacco: Never Used  Substance Use Topics  . Alcohol use: Yes    Comment: occasionally  . Drug use: No     Allergies   Other and Sulfa antibiotics   Review of Systems Review of Systems  All other systems reviewed and are negative.    Physical Exam Updated Vital Signs BP (!) 167/101 (BP Location: Right Arm)   Pulse 69   Temp 98.4 F (36.9 C) (Oral)   Resp 16   LMP  04/07/2018   SpO2 98%   Physical Exam  Constitutional: She is oriented to person, place, and time. She appears well-developed and well-nourished.  HENT:  Head: Normocephalic and atraumatic.  Eyes: Pupils are equal, round, and reactive to light. Conjunctivae are normal. Right eye exhibits no discharge. Left eye exhibits no discharge. No scleral icterus.  Neck: Normal range of motion. No JVD present. No tracheal deviation present.  Pulmonary/Chest: Effort normal. No stridor.  Abdominal: Soft. She exhibits no distension and no mass. There is no tenderness. There is no rebound and no guarding.  Musculoskeletal:  muscular tightness noted to right lateral lumbar musculature- no midline TTP- straight leg negative, no distal Neuro deficits.   Neurological: She is alert and oriented to person, place, and time. Coordination normal.  Psychiatric: She has a normal mood and affect. Her behavior is normal. Judgment and thought content normal.  Nursing note and vitals reviewed.    ED Treatments / Results  Labs (all labs ordered are listed, but only abnormal results are displayed) Labs Reviewed  URINALYSIS, ROUTINE W REFLEX MICROSCOPIC - Abnormal; Notable for the following components:      Result Value   Hgb urine dipstick SMALL (*)    Protein, ur 30 (*)    All other components within normal limits  BASIC METABOLIC PANEL - Abnormal; Notable for the following components:   Glucose, Bld 107 (*)    All other components within normal limits  CBC  I-STAT BETA HCG BLOOD, ED ([facility], [facility], [facility] ONLY)    EKG None  Radiology No results found.  Procedures Procedures (including critical care time)  Medications Ordered in ED Medications  ketorolac (TORADOL) 30 MG/ML injection 30 mg (30 mg Intramuscular Given 04/21/18 2038)  oxyCODONE-acetaminophen (PERCOCET/ROXICET) 5-325 MG per tablet 1 tablet (1 tablet Oral Given 04/21/18 2037)     Initial Impression / Assessment and Plan / ED Course  I have  reviewed the triage vital signs and the nursing notes.  Pertinent labs & imaging results that were available during my care of the patient were reviewed by me and considered in my medical decision making (see chart for details).     Labs: cbc, bmp, ua  Imaging: CT renal  Consults:  Therapeutics:  Discharge Meds:   Assessment/Plan: 53 YOF with likely muscular back pain. CT reassuring, labs without acute abnormality. Ct findings discussed with patient including sacroiliac fusion and prominent uterine fundus. Pt will discuss these findings with her PCP. She is given strict return precautions. No further questions or concerns  at the time of discharge.       Final Clinical Impressions(s) / ED Diagnoses   Final diagnoses:  Acute right-sided low back pain without sciatica    ED Discharge Orders         Ordered    methocarbamol (ROBAXIN) 500 MG tablet  2 times daily,   Status:  Discontinued     04/21/18 2055    methocarbamol (ROBAXIN) 500 MG tablet  2 times daily     04/21/18 2056           Okey Regal, PA-C 04/25/18 0818    Milton Ferguson, MD 04/25/18 1320

## 2018-05-05 ENCOUNTER — Other Ambulatory Visit: Payer: Self-pay | Admitting: Obstetrics & Gynecology

## 2018-05-05 DIAGNOSIS — Z1231 Encounter for screening mammogram for malignant neoplasm of breast: Secondary | ICD-10-CM

## 2018-06-01 ENCOUNTER — Encounter: Payer: Self-pay | Admitting: Family Medicine

## 2018-06-01 ENCOUNTER — Ambulatory Visit (INDEPENDENT_AMBULATORY_CARE_PROVIDER_SITE_OTHER): Payer: BLUE CROSS/BLUE SHIELD | Admitting: Family Medicine

## 2018-06-01 VITALS — BP 120/80 | HR 72 | Temp 98.7°F | Ht 63.0 in | Wt 210.0 lb

## 2018-06-01 DIAGNOSIS — Z6837 Body mass index (BMI) 37.0-37.9, adult: Secondary | ICD-10-CM

## 2018-06-01 DIAGNOSIS — R059 Cough, unspecified: Secondary | ICD-10-CM

## 2018-06-01 DIAGNOSIS — R05 Cough: Secondary | ICD-10-CM | POA: Diagnosis not present

## 2018-06-01 DIAGNOSIS — J0110 Acute frontal sinusitis, unspecified: Secondary | ICD-10-CM

## 2018-06-01 DIAGNOSIS — M940 Chondrocostal junction syndrome [Tietze]: Secondary | ICD-10-CM | POA: Diagnosis not present

## 2018-06-01 MED ORDER — FLUCONAZOLE 150 MG PO TABS: 150.0000 mg | ORAL_TABLET | Freq: Once | ORAL | 0 refills | Status: AC

## 2018-06-01 MED ORDER — AMOXICILLIN 500 MG PO CAPS: 500.0000 mg | ORAL_CAPSULE | Freq: Three times a day (TID) | ORAL | 0 refills | Status: DC

## 2018-06-01 MED ORDER — GUAIFENESIN-CODEINE 100-10 MG/5ML PO SYRP: 5.0000 mL | ORAL_SOLUTION | Freq: Three times a day (TID) | ORAL | 0 refills | Status: DC | PRN

## 2018-06-01 NOTE — Patient Instructions (Signed)

## 2018-06-01 NOTE — Progress Notes (Signed)
Date:  06/01/2018   Name:  Jaclyn Fields   DOB:  25-Aug-1970   MRN:  [id number]   Chief Complaint: Sinusitis (cough, hoarse, headache, nasal cong, chills) Sinusitis  This is a new problem. The current episode started in the past 7 days (Friday). The problem has been gradually worsening since onset. There has been no fever. The fever has been present for 3 to 4 days. Her pain is at a severity of 2/10. The pain is mild. Associated symptoms include chills, congestion, coughing, ear pain, headaches, a hoarse voice, sinus pressure, sneezing and a sore throat. Pertinent negatives include no diaphoresis, neck pain, shortness of breath or swollen glands. Past treatments include oral decongestants. The treatment provided mild relief.     Review of Systems  Constitutional: Positive for chills. Negative for diaphoresis, fatigue, fever and unexpected weight change.  HENT: Positive for congestion, ear pain, hoarse voice, sinus pressure, sneezing and sore throat. Negative for ear discharge and rhinorrhea.   Eyes: Negative for photophobia, pain, discharge, redness and itching.  Respiratory: Positive for cough. Negative for shortness of breath, wheezing and stridor.   Gastrointestinal: Negative for abdominal pain, blood in stool, constipation, diarrhea, nausea and vomiting.  Endocrine: Negative for cold intolerance, heat intolerance, polydipsia, polyphagia and polyuria.  Genitourinary: Negative for dysuria, flank pain, frequency, hematuria, menstrual problem, pelvic pain, urgency, vaginal bleeding and vaginal discharge.  Musculoskeletal: Negative for arthralgias, back pain, myalgias and neck pain.  Skin: Negative for rash.  Allergic/Immunologic: Negative for environmental allergies and food allergies.  Neurological: Positive for headaches. Negative for dizziness, weakness, light-headedness and numbness.  Hematological: Negative for adenopathy. Does not bruise/bleed easily.  Psychiatric/Behavioral:  Negative for dysphoric mood. The patient is not nervous/anxious.     Patient Active Problem List   Diagnosis Date Noted  . Essential hypertension 01/02/2018  . Transfusion history 08/12/2012  . Postpartum hemorrhage 09/05/2011  . PP care - s/p 1C/S 3/1 (breech, [facility]) 08/24/2011  . Maternal iron deficiency anemia 08/24/2011  . Benign essential hypertension complicating pregnancy, childbirth, and the puerperium 08/23/2011    Allergies  Allergen Reactions  . Other Other (See Comments)    Other Reaction: SWELLING TO FINGERS  . Sulfa Antibiotics Rash    Other Reaction: SWELLING TO FINGERS    Past Surgical History:  Procedure Laterality Date  . AUGMENTATION MAMMAPLASTY Bilateral 2005  . CESAREAN SECTION    . DILATION AND EVACUATION  09/05/2011   Procedure: DILATATION AND EVACUATION;  Surgeon: Elveria Royals, MD;  Location: [facility];  Service: Gynecology;  Laterality: Bilateral;  . TUBAL LIGATION    . WISDOM TOOTH EXTRACTION      Social History   Tobacco Use  . Smoking status: Never Smoker  . Smokeless tobacco: Never Used  Substance Use Topics  . Alcohol use: Yes    Comment: occasionally  . Drug use: No     Medication list has been reviewed and updated.  Current Meds  Medication Sig  . cetirizine (ZYRTEC) 10 MG tablet Take 1 tablet (10 mg total) by mouth daily.  . fluticasone (FLONASE) 50 MCG/ACT nasal spray Place 2 sprays into both nostrils daily.  . hydrochlorothiazide (HYDRODIURIL) 25 MG tablet Take 1 tablet (25 mg total) by mouth daily.  Marland Kitchen ibuprofen (ADVIL,MOTRIN) 800 MG tablet Take 1 tablet (800 mg total) by mouth every 8 (eight) hours as needed.  . meloxicam (MOBIC) 7.5 MG tablet TAKE 1 TABLET BY MOUTH TWICE A DAY  . methocarbamol (ROBAXIN) 500 MG tablet  Take 1 tablet (500 mg total) by mouth 2 (two) times daily.  . metoprolol succinate (TOPROL-XL) 100 MG 24 hr tablet Take 1 tablet (100 mg total) by mouth daily. Take with or immediately following a meal.    PHQ 2/9  Scores 04/01/2018 01/20/2017 09/05/2015 09/01/2015  PHQ - 2 Score 0 0 0 0    Physical Exam  Constitutional: She is oriented to person, place, and time. She appears well-developed and well-nourished.  HENT:  Head: Normocephalic.  Right Ear: Hearing, tympanic membrane, external ear and ear canal normal.  Left Ear: Hearing, tympanic membrane, external ear and ear canal normal.  Nose: Mucosal edema present. Right sinus exhibits frontal sinus tenderness. Right sinus exhibits no maxillary sinus tenderness. Left sinus exhibits no maxillary sinus tenderness and no frontal sinus tenderness.  Mouth/Throat: Uvula is midline and oropharynx is clear and moist. No oropharyngeal exudate or posterior oropharyngeal erythema.  Eyes: Pupils are equal, round, and reactive to light. Conjunctivae and EOM are normal. Lids are everted and swept, no foreign bodies found. Left eye exhibits no hordeolum. No foreign body present in the left eye. Right conjunctiva is not injected. Left conjunctiva is not injected. No scleral icterus.  Neck: Normal range of motion. Neck supple. No JVD present. No tracheal deviation present. No thyromegaly present.  Cardiovascular: Normal rate, regular rhythm, normal heart sounds and intact distal pulses. Exam reveals no gallop and no friction rub.  No murmur heard. Pulmonary/Chest: Effort normal and breath sounds normal. No respiratory distress. She has no wheezes. She has no rales. She exhibits tenderness.  Abdominal: Soft. Bowel sounds are normal. She exhibits no mass. There is no hepatosplenomegaly. There is no tenderness. There is no rebound and no guarding.  Musculoskeletal: Normal range of motion. She exhibits no edema or tenderness.  Lymphadenopathy:    She has no cervical adenopathy.  Neurological: She is alert and oriented to person, place, and time. She has normal strength. She displays normal reflexes. No cranial nerve deficit.  Skin: Skin is warm. No rash noted.  Psychiatric: She  has a normal mood and affect. Her mood appears not anxious. She does not exhibit a depressed mood.  Nursing note and vitals reviewed.   BP 120/80   Pulse 72   Ht 5\' 3"  (1.6 m)   Wt 210 lb (95.3 kg)   LMP 05/30/2018 (Approximate)   BMI 37.20 kg/m   Assessment and Plan:   1. Acute frontal sinusitis, recurrence not specified Acute.  Tenderness in the frontal sinus areas bilateral suggesting frontal sinusitis.  Will initiate amoxicillin 500 mg 3 times daily for 10 days - amoxicillin (AMOXIL) 500 MG capsule; Take 1 capsule (500 mg total) by mouth 3 (three) times daily.  Dispense: 30 capsule; Refill: 0  2. Cough Acute.  Will cover with Robitussin-AC for cough. - guaiFENesin-codeine (ROBITUSSIN AC) 100-10 MG/5ML syrup; Take 5 mLs by mouth 3 (three) times daily as needed for cough.  Dispense: 100 mL; Refill: 0  3. Costochondritis Noted tenderness along the costochondral margins consistent with costochondritis.  Suggest over-the-counter NSAIDs.  4. BMI 37.0-37.9, adult Weight concern discussed.Health risks of being over weight were discussed and patient was counseled on weight loss options and exercise.   Dr. Macon [facility]  06/01/2018

## 2018-06-02 ENCOUNTER — Telehealth: Payer: Self-pay

## 2018-06-02 ENCOUNTER — Other Ambulatory Visit: Payer: Self-pay

## 2018-06-02 DIAGNOSIS — B309 Viral conjunctivitis, unspecified: Secondary | ICD-10-CM

## 2018-06-02 MED ORDER — OFLOXACIN 0.3 % OP SOLN: 1.0000 [drp] | Freq: Four times a day (QID) | OPHTHALMIC | 0 refills | Status: DC

## 2018-06-02 NOTE — Telephone Encounter (Signed)
Pt called in wanting an eye drop for L) pink eye- woke up with red eye and matted/ oozing- sent in eye drop to [facility]

## 2018-06-19 ENCOUNTER — Ambulatory Visit
Admission: RE | Admit: 2018-06-19 | Discharge: 2018-06-19 | Disposition: A | Discharge status: Home / Self Care | Payer: BLUE CROSS/BLUE SHIELD | Source: Ambulatory Visit | Attending: Obstetrics & Gynecology | Admitting: Obstetrics & Gynecology

## 2018-06-19 DIAGNOSIS — Z1231 Encounter for screening mammogram for malignant neoplasm of breast: Secondary | ICD-10-CM

## 2018-07-10 ENCOUNTER — Ambulatory Visit (INDEPENDENT_AMBULATORY_CARE_PROVIDER_SITE_OTHER): Payer: BLUE CROSS/BLUE SHIELD | Admitting: Family Medicine

## 2018-07-10 ENCOUNTER — Encounter: Payer: Self-pay | Admitting: Family Medicine

## 2018-07-10 VITALS — BP 120/80 | HR 76 | Ht 63.0 in | Wt 213.0 lb

## 2018-07-10 DIAGNOSIS — I10 Essential (primary) hypertension: Secondary | ICD-10-CM | POA: Diagnosis not present

## 2018-07-10 DIAGNOSIS — H1013 Acute atopic conjunctivitis, bilateral: Secondary | ICD-10-CM

## 2018-07-10 MED ORDER — HYDROCHLOROTHIAZIDE 25 MG PO TABS: 25.0000 mg | ORAL_TABLET | Freq: Every day | ORAL | 1 refills | Status: DC

## 2018-07-10 MED ORDER — OLOPATADINE HCL 0.2 % OP SOLN: 1.0000 [drp] | Freq: Two times a day (BID) | OPHTHALMIC | 0 refills | Status: DC

## 2018-07-10 MED ORDER — METOPROLOL SUCCINATE ER 100 MG PO TB24: 100.0000 mg | ORAL_TABLET | Freq: Every day | ORAL | 1 refills | Status: DC

## 2018-07-10 NOTE — Progress Notes (Signed)
Date:  07/10/2018   Name:  Jaclyn Fields   DOB:  July 13, 1970   MRN:  [id number]   Chief Complaint: Hypertension  Hypertension  This is a chronic problem. The current episode started more than 1 year ago. The problem has been gradually improving since onset. The problem is controlled. Pertinent negatives include no anxiety, blurred vision, chest pain, headaches, malaise/fatigue, neck pain, orthopnea, palpitations, peripheral edema, PND or shortness of breath. There are no associated agents to hypertension. Risk factors for coronary artery disease include obesity and post-menopausal state. The current treatment provides moderate improvement. There are no compliance problems.  There is no history of angina, kidney disease, CAD/MI, CVA, heart failure, left ventricular hypertrophy, PVD or retinopathy. There is no history of chronic renal disease, a hypertension causing med or renovascular disease.    Review of Systems  Constitutional: Negative.  Negative for chills, fatigue, fever, malaise/fatigue and unexpected weight change.  HENT: Negative for congestion, drooling, ear discharge, ear pain, rhinorrhea, sinus pressure, sneezing and sore throat.   Eyes: Negative for blurred vision, photophobia, pain, discharge, redness and itching.  Respiratory: Negative for cough, shortness of breath, wheezing and stridor.   Cardiovascular: Negative for chest pain, palpitations, orthopnea, leg swelling and PND.  Gastrointestinal: Negative for abdominal pain, blood in stool, constipation, diarrhea, nausea and vomiting.  Endocrine: Negative for cold intolerance, heat intolerance, polydipsia, polyphagia and polyuria.  Genitourinary: Negative for dysuria, flank pain, frequency, hematuria, menstrual problem, pelvic pain, urgency, vaginal bleeding and vaginal discharge.  Musculoskeletal: Negative for arthralgias, back pain, myalgias and neck pain.  Skin: Negative for rash.  Allergic/Immunologic: Negative for  environmental allergies and food allergies.  Neurological: Negative for dizziness, weakness, light-headedness, numbness and headaches.  Hematological: Negative for adenopathy. Does not bruise/bleed easily.  Psychiatric/Behavioral: Negative for dysphoric mood and suicidal ideas. The patient is not nervous/anxious.     Patient Active Problem List   Diagnosis Date Noted  . Essential hypertension 01/02/2018  . Transfusion history 08/12/2012  . Postpartum hemorrhage 09/05/2011  . PP care - s/p 1C/S 3/1 (breech, [facility]) 08/24/2011  . Maternal iron deficiency anemia 08/24/2011  . Benign essential hypertension complicating pregnancy, childbirth, and the puerperium 08/23/2011    Allergies  Allergen Reactions  . Other Other (See Comments)    Other Reaction: SWELLING TO FINGERS  . Sulfa Antibiotics Rash    Other Reaction: SWELLING TO FINGERS    Past Surgical History:  Procedure Laterality Date  . AUGMENTATION MAMMAPLASTY Bilateral 2005  . CESAREAN SECTION    . DILATION AND EVACUATION  09/05/2011   Procedure: DILATATION AND EVACUATION;  Surgeon: Elveria Royals, MD;  Location: [facility];  Service: Gynecology;  Laterality: Bilateral;  . TUBAL LIGATION    . WISDOM TOOTH EXTRACTION      Social History   Tobacco Use  . Smoking status: Never Smoker  . Smokeless tobacco: Never Used  Substance Use Topics  . Alcohol use: Yes    Comment: occasionally  . Drug use: No     Medication list has been reviewed and updated.  Current Meds  Medication Sig  . cetirizine (ZYRTEC) 10 MG tablet Take 1 tablet (10 mg total) by mouth daily.  . fluticasone (FLONASE) 50 MCG/ACT nasal spray Place 2 sprays into both nostrils daily.  . hydrochlorothiazide (HYDRODIURIL) 25 MG tablet Take 1 tablet (25 mg total) by mouth daily.  Marland Kitchen ibuprofen (ADVIL,MOTRIN) 800 MG tablet Take 1 tablet (800 mg total) by mouth every 8 (eight) hours as needed.  Marland Kitchen  meloxicam (MOBIC) 7.5 MG tablet TAKE 1 TABLET BY MOUTH TWICE A DAY  .  methocarbamol (ROBAXIN) 500 MG tablet Take 1 tablet (500 mg total) by mouth 2 (two) times daily.  . metoprolol succinate (TOPROL-XL) 100 MG 24 hr tablet Take 1 tablet (100 mg total) by mouth daily. Take with or immediately following a meal.    PHQ 2/9 Scores 04/01/2018 01/20/2017 09/05/2015 09/01/2015  PHQ - 2 Score 0 0 0 0    Physical Exam Vitals signs and nursing note reviewed.  Constitutional:      Appearance: She is well-developed.  HENT:     Head: Normocephalic.     Right Ear: External ear normal.     Left Ear: External ear normal.  Eyes:     General: Lids are normal. Lids are everted, no foreign bodies appreciated. No scleral icterus.       Left eye: No foreign body or hordeolum.     Conjunctiva/sclera:     Right eye: Right conjunctiva is injected.     Left eye: Left conjunctiva is injected.     Pupils: Pupils are equal, round, and reactive to light.  Neck:     Musculoskeletal: Normal range of motion and neck supple.     Thyroid: No thyromegaly.     Vascular: No JVD.     Trachea: No tracheal deviation.  Cardiovascular:     Rate and Rhythm: Normal rate and regular rhythm.     Heart sounds: Normal heart sounds, S1 normal and S2 normal. No murmur. No systolic murmur. No diastolic murmur. No friction rub. No gallop. No S3 or S4 sounds.   Pulmonary:     Effort: Pulmonary effort is normal. No respiratory distress.     Breath sounds: Normal breath sounds. No decreased breath sounds, wheezing or rales.  Abdominal:     General: Bowel sounds are normal.     Palpations: Abdomen is soft. There is no hepatomegaly, splenomegaly or mass.     Tenderness: There is no abdominal tenderness. There is no guarding or rebound.  Musculoskeletal: Normal range of motion.        General: No tenderness.     Right lower leg: No edema.     Left lower leg: No edema.  Lymphadenopathy:     Head:     Right side of head: No submandibular adenopathy.     Left side of head: No submandibular adenopathy.      Cervical: No cervical adenopathy.     Right cervical: No superficial cervical adenopathy.    Left cervical: No superficial cervical adenopathy.  Skin:    General: Skin is warm.     Findings: No rash.  Neurological:     Mental Status: She is alert and oriented to person, place, and time.     Cranial Nerves: No cranial nerve deficit.     Deep Tendon Reflexes: Reflexes normal.  Psychiatric:        Mood and Affect: Mood is not anxious or depressed.     BP 120/80   Pulse 76   Ht 5\' 3"  (1.6 m)   Wt 213 lb (96.6 kg)   BMI 37.73 kg/m   Assessment and Plan: 1. Essential hypertension Chronic.  Controlled.  Continue hydrochlorothiazide 25 mg once a day and metoprolol 100 mg XL once a day. - hydrochlorothiazide (HYDRODIURIL) 25 MG tablet; Take 1 tablet (25 mg total) by mouth daily.  Dispense: 90 tablet; Refill: 1 - metoprolol succinate (TOPROL-XL) 100 MG 24 hr tablet;  Take 1 tablet (100 mg total) by mouth daily. Take with or immediately following a meal.  Dispense: 90 tablet; Refill: 1  2. Acute atopic conjunctivitis of both eyes Acute.  Likely atopic conjunctivitis will control with Pataday 1 drop to eye twice a day as needed. - Olopatadine HCl 0.2 % SOLN; Apply 1 drop to eye 2 (two) times daily.  Dispense: 2.5 mL; Refill: 0

## 2018-07-27 ENCOUNTER — Telehealth: Payer: Self-pay | Admitting: *Deleted

## 2018-07-27 DIAGNOSIS — M545 Low back pain, unspecified: Secondary | ICD-10-CM

## 2018-07-27 NOTE — Telephone Encounter (Signed)
Patient called was seen in ER in Oct 2019 had renal CT on 04/21/18 please review. Patient said ER told her to follow up with GYN.  Patient also c/o lower back pain worsen with cycle, her cycles are normally 4 days long. LMP:07/21/18.   Please advise

## 2018-07-27 NOTE — Telephone Encounter (Signed)
CT scan reviewed.  May have Fibroids and functional ovarian cysts.  Schedule a pelvic US with me.

## 2018-07-27 NOTE — Telephone Encounter (Signed)
Order placed, Rosemarie Ax can you call to schedule.

## 2018-07-28 NOTE — Telephone Encounter (Signed)
Spoke with patient schedule ultrasound appointment for March 11.

## 2018-08-01 ENCOUNTER — Other Ambulatory Visit: Payer: Self-pay | Admitting: Family Medicine

## 2018-08-01 DIAGNOSIS — H1013 Acute atopic conjunctivitis, bilateral: Secondary | ICD-10-CM

## 2018-09-02 ENCOUNTER — Encounter: Payer: Self-pay | Admitting: Obstetrics & Gynecology

## 2018-09-02 ENCOUNTER — Ambulatory Visit (INDEPENDENT_AMBULATORY_CARE_PROVIDER_SITE_OTHER): Payer: BLUE CROSS/BLUE SHIELD

## 2018-09-02 ENCOUNTER — Other Ambulatory Visit: Payer: Self-pay | Admitting: Obstetrics & Gynecology

## 2018-09-02 ENCOUNTER — Other Ambulatory Visit: Payer: Self-pay

## 2018-09-02 ENCOUNTER — Ambulatory Visit (INDEPENDENT_AMBULATORY_CARE_PROVIDER_SITE_OTHER): Payer: BLUE CROSS/BLUE SHIELD | Admitting: Obstetrics & Gynecology

## 2018-09-02 DIAGNOSIS — M545 Low back pain, unspecified: Secondary | ICD-10-CM

## 2018-09-02 DIAGNOSIS — N852 Hypertrophy of uterus: Secondary | ICD-10-CM

## 2018-09-02 DIAGNOSIS — D251 Intramural leiomyoma of uterus: Secondary | ICD-10-CM

## 2018-09-02 DIAGNOSIS — N946 Dysmenorrhea, unspecified: Secondary | ICD-10-CM

## 2018-09-02 DIAGNOSIS — Z9851 Tubal ligation status: Secondary | ICD-10-CM | POA: Diagnosis not present

## 2018-09-02 NOTE — Progress Notes (Signed)
    Jaclyn Fields 1971-06-11 [id number]        48 y.o.  G2P1011 Married.  S/P TL.  RP: Dysmenorrhea/suspected uterine fibroid on CT for Pelvic US  HPI:  Presented to ER for severe pelvic cramping during her menstrual.  October 2019.  At that time a CT scan of the pelvis revealed probable uterine fibroids.  Since then, menstrual periods every month with normal flow.  Only the menstrual period of January 2020 was painful with severe pelvic cramps.  Patient is taking ibuprofen to control the cramps with success currently.  No breakthrough bleeding.  No pelvic pain outside of the menstrual periods.  No pain with intercourse.  Urine and bowel movements normal.   OB History  Gravida Para Term Preterm AB Living  2 1 1   1 1   SAB TAB Ectopic Multiple Live Births    1     1    # Outcome Date GA Lbr Len/2nd Weight Sex Delivery Anes PTL Lv  2 Term 08/23/11 [redacted]w[redacted]d  6 lb 10.9 oz (3.03 kg) F CS-LTranv Spinal  LIV  1 TAB             Past medical history,surgical history, problem list, medications, allergies, family history and social history were all reviewed and documented in the EPIC chart.   Directed ROS with pertinent positives and negatives documented in the history of present illness/assessment and plan.  Exam:  There were no vitals filed for this visit. General appearance:  Normal  Pelvic US today: T/V and T/A images.  Mildly enlarged anteverted uterus measuring 9.74 x 6.39 x 5.21 cm.  Intramural fibroid measuring 4.2 x 4.2 x 4.4 cm displacing the endometrium.  Endometrial lining normal at 8.4 mm.  Right ovary normal.  Left ovary normal seen best with T/A images, T/V images limited.  No free fluid in the posterior cul-de-sac.   Assessment/Plan:  48 y.o. G2P1011   1. Dysmenorrhea Decision to observe and control with ibuprofen at this time.  The possibility of eventually starting a progestin-only birth control pill to control dysmenorrhea discussed with patient.  2. Intramural  leiomyoma of uterus Pelvic ultrasound findings reviewed with patient thoroughly.  Only 1 intramural fibroid seen measuring 4.4 cm.  Endometrial lining normal.  Ovaries normal.  Patient reassured.  3. S/P tubal ligation  Counseling on above issues and coordination of care more than 50% for 15 minutes.  [name] MD, 11:21 AM 09/02/2018

## 2018-09-02 NOTE — Patient Instructions (Signed)
1. Dysmenorrhea Decision to observe and control with ibuprofen at this time.  The possibility of eventually starting a progestin-only birth control pill to control dysmenorrhea discussed with patient.  2. Intramural leiomyoma of uterus Pelvic ultrasound findings reviewed with patient thoroughly.  Only 1 intramural fibroid seen measuring 4.4 cm.  Endometrial lining normal.  Ovaries normal.  Patient reassured.  3. S/P tubal ligation  Jaclyn Fields, it was a pleasure seeing you today!

## 2018-11-03 ENCOUNTER — Other Ambulatory Visit: Payer: Self-pay

## 2018-11-04 ENCOUNTER — Encounter: Payer: Federal, State, Local not specified - PPO | Admitting: Obstetrics & Gynecology

## 2018-11-18 DIAGNOSIS — E079 Disorder of thyroid, unspecified: Secondary | ICD-10-CM | POA: Diagnosis not present

## 2018-11-20 ENCOUNTER — Other Ambulatory Visit: Payer: Self-pay | Admitting: Family Medicine

## 2018-11-20 DIAGNOSIS — J301 Allergic rhinitis due to pollen: Secondary | ICD-10-CM

## 2019-01-08 ENCOUNTER — Encounter: Payer: Self-pay | Admitting: Family Medicine

## 2019-01-08 ENCOUNTER — Other Ambulatory Visit: Payer: Self-pay

## 2019-01-08 ENCOUNTER — Ambulatory Visit (INDEPENDENT_AMBULATORY_CARE_PROVIDER_SITE_OTHER): Payer: BC Managed Care – PPO | Admitting: Family Medicine

## 2019-01-08 VITALS — BP 128/80 | HR 73 | Ht 63.0 in | Wt 212.0 lb

## 2019-01-08 DIAGNOSIS — I10 Essential (primary) hypertension: Secondary | ICD-10-CM

## 2019-01-08 MED ORDER — METOPROLOL SUCCINATE ER 100 MG PO TB24: 100.0000 mg | ORAL_TABLET | Freq: Every day | ORAL | 1 refills | Status: DC

## 2019-01-08 MED ORDER — HYDROCHLOROTHIAZIDE 25 MG PO TABS: 25.0000 mg | ORAL_TABLET | Freq: Every day | ORAL | 1 refills | Status: DC

## 2019-01-08 MED ORDER — BIMATOPROST 0.03 % EX SOLN: 1.0000 "application " | Freq: Every day | CUTANEOUS | 12 refills | Status: DC

## 2019-01-08 NOTE — Progress Notes (Signed)
Date:  01/08/2019   Name:  Jaclyn Fields   DOB:  [birth year]   MRN:  [id number]   Chief Complaint: Hypertension (Metoprolol RF)  Hypertension This is a chronic problem. The current episode started more than 1 year ago. The problem has been gradually improving since onset. The problem is controlled. Pertinent negatives include no anxiety, blurred vision, chest pain, headaches, malaise/fatigue, neck pain, orthopnea, palpitations, peripheral edema, PND, shortness of breath or sweats. There are no associated agents to hypertension. There are no known risk factors for coronary artery disease. Past treatments include beta blockers and diuretics. The current treatment provides moderate improvement. There are no compliance problems.  There is no history of angina, kidney disease, CAD/MI, CVA, heart failure, left ventricular hypertrophy, PVD or retinopathy. There is no history of chronic renal disease, a hypertension causing med or renovascular disease.    Review of Systems  Constitutional: Negative.  Negative for chills, fatigue, fever, malaise/fatigue and unexpected weight change.  HENT: Negative for congestion, ear discharge, ear pain, rhinorrhea, sinus pressure, sneezing and sore throat.   Eyes: Negative for blurred vision, photophobia, pain, discharge, redness and itching.  Respiratory: Negative for cough, shortness of breath, wheezing and stridor.   Cardiovascular: Negative for chest pain, palpitations, orthopnea and PND.  Gastrointestinal: Negative for abdominal distention, abdominal pain, anal bleeding, blood in stool, constipation, diarrhea, nausea and vomiting.  Endocrine: Negative for cold intolerance, heat intolerance, polydipsia, polyphagia and polyuria.  Genitourinary: Negative for dysuria, flank pain, frequency, hematuria, menstrual problem, pelvic pain, urgency, vaginal bleeding and vaginal discharge.  Musculoskeletal: Negative for arthralgias, back pain, myalgias and neck pain.   Skin: Negative for rash.  Allergic/Immunologic: Negative for environmental allergies and food allergies.  Neurological: Negative for dizziness, weakness, light-headedness, numbness and headaches.  Hematological: Negative for adenopathy. Does not bruise/bleed easily.  Psychiatric/Behavioral: Negative for dysphoric mood. The patient is not nervous/anxious.     Patient Active Problem List   Diagnosis Date Noted  . Essential hypertension 01/02/2018  . Transfusion history 08/12/2012  . Postpartum hemorrhage 09/05/2011  . PP care - s/p 1C/S 3/1 (breech, [facility]) 08/24/2011  . Maternal iron deficiency anemia 08/24/2011  . Benign essential hypertension complicating pregnancy, childbirth, and the puerperium 08/23/2011    Allergies  Allergen Reactions  . Other Other (See Comments)    Other Reaction: SWELLING TO FINGERS  . Sulfa Antibiotics Rash    Other Reaction: SWELLING TO FINGERS    Past Surgical History:  Procedure Laterality Date  . AUGMENTATION MAMMAPLASTY Bilateral 2005  . CESAREAN SECTION    . DILATION AND EVACUATION  09/05/2011   Procedure: DILATATION AND EVACUATION;  Surgeon: Elveria Royals, MD;  Location: [facility];  Service: Gynecology;  Laterality: Bilateral;  . TUBAL LIGATION    . WISDOM TOOTH EXTRACTION      Social History   Tobacco Use  . Smoking status: Never Smoker  . Smokeless tobacco: Never Used  Substance Use Topics  . Alcohol use: Yes    Comment: occasionally  . Drug use: No     Medication list has been reviewed and updated.  Current Meds  Medication Sig  . cetirizine (ZYRTEC) 10 MG tablet Take 1 tablet (10 mg total) by mouth daily.  . fluticasone (FLONASE) 50 MCG/ACT nasal spray SPRAY 2 SPRAYS INTO EACH NOSTRIL EVERY DAY  . hydrochlorothiazide (HYDRODIURIL) 25 MG tablet Take 1 tablet (25 mg total) by mouth daily.  Marland Kitchen ibuprofen (ADVIL,MOTRIN) 800 MG tablet Take 1 tablet (800 mg total) by  mouth every 8 (eight) hours as needed.  . meloxicam (MOBIC) 7.5 MG  tablet TAKE 1 TABLET BY MOUTH TWICE A DAY  . methocarbamol (ROBAXIN) 500 MG tablet Take 1 tablet (500 mg total) by mouth 2 (two) times daily.  . metoprolol succinate (TOPROL-XL) 100 MG 24 hr tablet Take 1 tablet (100 mg total) by mouth daily. Take with or immediately following a meal.  . Olopatadine HCl 0.2 % SOLN Apply 1 drop to eye 2 (two) times daily.    PHQ 2/9 Scores 01/08/2019 04/01/2018 01/20/2017 09/05/2015  PHQ - 2 Score 0 0 0 0    BP Readings from Last 3 Encounters:  01/08/19 128/80  07/10/18 120/80  06/01/18 120/80    Physical Exam Vitals signs reviewed.  Constitutional:      Appearance: She is well-developed.  HENT:     Head: Normocephalic.     Right Ear: Tympanic membrane, ear canal and external ear normal.     Left Ear: Tympanic membrane, ear canal and external ear normal.     Nose: Nose normal.  Eyes:     General: Lids are everted, no foreign bodies appreciated. No scleral icterus.       Left eye: No foreign body or hordeolum.     Conjunctiva/sclera: Conjunctivae normal.     Right eye: Right conjunctiva is not injected.     Left eye: Left conjunctiva is not injected.     Pupils: Pupils are equal, round, and reactive to light.  Neck:     Musculoskeletal: Normal range of motion and neck supple.     Thyroid: No thyromegaly.     Vascular: No JVD.     Trachea: No tracheal deviation.  Cardiovascular:     Rate and Rhythm: Normal rate and regular rhythm.     Heart sounds: Normal heart sounds. No murmur. No friction rub. No gallop.   Pulmonary:     Effort: Pulmonary effort is normal. No respiratory distress.     Breath sounds: Normal breath sounds. No wheezing, rhonchi or rales.  Abdominal:     General: Bowel sounds are normal. There is no distension.     Palpations: Abdomen is soft. There is no mass.     Tenderness: There is no abdominal tenderness. There is no guarding or rebound.     Hernia: No hernia is present.  Musculoskeletal: Normal range of motion.         General: No tenderness.  Lymphadenopathy:     Cervical: No cervical adenopathy.  Skin:    General: Skin is warm.     Findings: No rash.  Neurological:     Mental Status: She is alert and oriented to person, place, and time.     Cranial Nerves: No cranial nerve deficit.     Deep Tendon Reflexes: Reflexes normal.  Psychiatric:        Mood and Affect: Mood is not anxious or depressed.     Wt Readings from Last 3 Encounters:  01/08/19 212 lb (96.2 kg)  07/10/18 213 lb (96.6 kg)  06/01/18 210 lb (95.3 kg)    BP 128/80   Pulse 73   Ht 5\' 3"  (1.6 m)   Wt 212 lb (96.2 kg)   SpO2 99%   BMI 37.55 kg/m   Assessment and Plan:  1. Essential hypertension Chronic.  Controlled.  Continue hydrochlorothiazide 25 mg and metoprolol XL 100 mg once a day.  Check patient's previous renal panel and we will repeat a renal panel as that  is been over a year. - hydrochlorothiazide (HYDRODIURIL) 25 MG tablet; Take 1 tablet (25 mg total) by mouth daily.  Dispense: 90 tablet; Refill: 1 - metoprolol succinate (TOPROL-XL) 100 MG 24 hr tablet; Take 1 tablet (100 mg total) by mouth daily. Take with or immediately following a meal.  Dispense: 90 tablet; Refill: 1 - Renal Function Panel

## 2019-01-09 LAB — RENAL FUNCTION PANEL
Albumin: 4.1 g/dL (ref 3.8–4.8)
BUN/Creatinine Ratio: 15 (ref 9–23)
BUN: 13 mg/dL (ref 6–24)
CO2: 24 mmol/L (ref 20–29)
Calcium: 9.3 mg/dL (ref 8.7–10.2)
Chloride: 95 mmol/L — ABNORMAL LOW (ref 96–106)
Creatinine, Ser: 0.87 mg/dL (ref 0.57–1.00)
GFR calc Af Amer: 92 mL/min/{1.73_m2} (ref >59)
GFR calc non Af Amer: 80 mL/min/{1.73_m2} (ref >59)
Glucose: 93 mg/dL (ref 65–99)
Phosphorus: 2.9 mg/dL — ABNORMAL LOW (ref 3.0–4.3)
Potassium: 3.6 mmol/L (ref 3.5–5.2)
Sodium: 138 mmol/L (ref 134–144)

## 2019-01-11 ENCOUNTER — Other Ambulatory Visit: Payer: Self-pay

## 2019-01-12 ENCOUNTER — Encounter: Payer: Self-pay | Admitting: Obstetrics & Gynecology

## 2019-01-12 ENCOUNTER — Ambulatory Visit (INDEPENDENT_AMBULATORY_CARE_PROVIDER_SITE_OTHER): Payer: BC Managed Care – PPO | Admitting: Obstetrics & Gynecology

## 2019-01-12 VITALS — BP 134/86 | Ht 65.0 in | Wt 211.0 lb

## 2019-01-12 DIAGNOSIS — Z6835 Body mass index (BMI) 35.0-35.9, adult: Secondary | ICD-10-CM

## 2019-01-12 DIAGNOSIS — Q831 Accessory breast: Secondary | ICD-10-CM | POA: Diagnosis not present

## 2019-01-12 DIAGNOSIS — Z01419 Encounter for gynecological examination (general) (routine) without abnormal findings: Secondary | ICD-10-CM | POA: Diagnosis not present

## 2019-01-12 DIAGNOSIS — Z9851 Tubal ligation status: Secondary | ICD-10-CM

## 2019-01-12 DIAGNOSIS — R87618 Other abnormal cytological findings on specimens from cervix uteri: Secondary | ICD-10-CM | POA: Diagnosis not present

## 2019-01-12 DIAGNOSIS — D219 Benign neoplasm of connective and other soft tissue, unspecified: Secondary | ICD-10-CM

## 2019-01-12 DIAGNOSIS — E6609 Other obesity due to excess calories: Secondary | ICD-10-CM

## 2019-01-12 DIAGNOSIS — E559 Vitamin D deficiency, unspecified: Secondary | ICD-10-CM | POA: Diagnosis not present

## 2019-01-12 NOTE — Addendum Note (Signed)
Addended by: Thurnell Garbe A on: 01/12/2019 10:49 AM   Modules accepted: Orders

## 2019-01-12 NOTE — Progress Notes (Signed)
Jaclyn Fields Nov 07, 1970 [id number]   History:    48 y.o. G2P1A1L1 Married.  S/P TL.  Daughter is 37 yo Dancing/great reader.  RP:  Established patient presenting for annual gyn exam   HPI:  Menses regular normal every month except dysmenorrhea controled on Ibuprofen.  No BTB.  No pelvic pain.  No pain with IC.  Urine/BMs normal.  Breasts normal, but tender breast tissue extension at Rt axilla.  Requesting to have that removed.  BMI 35.11.  Not regularly physically active.  CMP normal with Fam MD 12/2018, rest of labs not done.    Past medical history,surgical history, family history and social history were all reviewed and documented in the EPIC chart.  Gynecologic History Patient's last menstrual period was 01/04/2019. Contraception: tubal ligation Last Pap: 10/2017. Results were: Negative Last mammogram: 05/2018. Results were: Negative Bone Density: Never Colonoscopy: Never  Obstetric History OB History  Gravida Para Term Preterm AB Living  2 1 1   1 1   SAB TAB Ectopic Multiple Live Births    1     1    # Outcome Date GA Lbr Len/2nd Weight Sex Delivery Anes PTL Lv  2 Term 08/23/11 [redacted]w[redacted]d  6 lb 10.9 oz (3.03 kg) F CS-LTranv Spinal  LIV  1 TAB              ROS: A ROS was performed and pertinent positives and negatives are included in the history.  GENERAL: No fevers or chills. HEENT: No change in vision, no earache, sore throat or sinus congestion. NECK: No pain or stiffness. CARDIOVASCULAR: No chest pain or pressure. No palpitations. PULMONARY: No shortness of breath, cough or wheeze. GASTROINTESTINAL: No abdominal pain, nausea, vomiting or diarrhea, melena or bright red blood per rectum. GENITOURINARY: No urinary frequency, urgency, hesitancy or dysuria. MUSCULOSKELETAL: No joint or muscle pain, no back pain, no recent trauma. DERMATOLOGIC: No rash, no itching, no lesions. ENDOCRINE: No polyuria, polydipsia, no heat or cold intolerance. No recent change in weight.  HEMATOLOGICAL: No anemia or easy bruising or bleeding. NEUROLOGIC: No headache, seizures, numbness, tingling or weakness. PSYCHIATRIC: No depression, no loss of interest in normal activity or change in sleep pattern.     Exam:   BP 134/86   Ht 5\' 5"  (1.651 m)   Wt 211 lb (95.7 kg)   LMP 01/04/2019 Comment: tubal ligation  BMI 35.11 kg/m   Body mass index is 35.11 kg/m.  General appearance : Well developed well nourished female. No acute distress HEENT: Eyes: no retinal hemorrhage or exudates,  Neck supple, trachea midline, no carotid bruits, no thyroidmegaly Lungs: Clear to auscultation, no rhonchi or wheezes, or rib retractions  Heart: Regular rate and rhythm, no murmurs or gallops Breast:Examined in sitting and supine position were symmetrical in appearance, no palpable masses or tenderness,  no skin retraction, no nipple inversion, no nipple discharge, no skin discoloration, no axillary or supraclavicular lymphadenopathy Abdomen: no palpable masses or tenderness, no rebound or guarding Extremities: no edema or skin discoloration or tenderness  Pelvic: Vulva: Normal             Vagina: No gross lesions or discharge  Cervix: No gross lesions or discharge.  Pap reflex done.  Uterus  AV, stable size, shape and consistency, non-tender and mobile  Adnexa  Without masses or tenderness  Anus: Normal  Pelvic US 09/02/2018: T/V and T/Aimages. Mildly enlarged anteverted uterus measuring 9.74 x 6.39 x 5.21 cm. Intramural fibroid measuring 4.2 x  4.2 x 4.4 cm displacing the endometrium. Endometrial lining normal at 8.4 mm. Right ovary normal. Left ovary normal seen bestwith T/Aimages, T/V images limited. No free fluid in the posterior cul-de-sac.   Assessment/Plan:  48 y.o. female for annual exam   1. Encounter for routine gynecological examination with Papanicolaou smear of cervix Normal stable gynecologic exam with a known intramural fibroid.  Pap reflex done.  Breast exam normal,  with extension of breast tissue at the right axilla.  Screening mammogram December 2019 was negative.  CMP normal with family physician July 2020, will complete the work-up here. - CBC - TSH - VITAMIN D 25 Hydroxy (Vit-D Deficiency, Fractures) - Lipid panel; Future  2. S/P tubal ligation  3. Axillary accessory breast tissue Associated with pain and discomfort because of location at the right axilla and pressure when moving her arm.  Refer to general surgery to assess and manage.  4. Fibroid Stable minimally symptomatic intramural fibroid measured at 4.4 cm on pelvic ultrasound September 02, 2018.  We will continue to observe.  Ibuprofen for dysmenorrhea.    5. Class 2 obesity due to excess calories without serious comorbidity with body mass index (BMI) of 35.0 to 35.9 in adult Recommend a lower calorie/carb diet such as [employer].  Aerobic physical activities 5 times a week and weightlifting every 2 days.  Counseling on above issues and coordination of care more than 50% for 10 minutes.  [name] MD, 9:58 AM 01/12/2019

## 2019-01-12 NOTE — Patient Instructions (Signed)
1. Encounter for routine gynecological examination with Papanicolaou smear of cervix Normal stable gynecologic exam with a known intramural fibroid.  Pap reflex done.  Breast exam normal, with extension of breast tissue at the right axilla.  Screening mammogram December 2019 was negative.  CMP normal with family physician July 2020, will complete the work-up here. - CBC - TSH - VITAMIN D 25 Hydroxy (Vit-D Deficiency, Fractures) - Lipid panel; Future  2. S/P tubal ligation  3. Axillary accessory breast tissue Associated with pain and discomfort because of location at the right axilla and pressure when moving her arm.  Refer to general surgery to assess and manage.  4. Fibroid Stable minimally symptomatic intramural fibroid measured at 4.4 cm on pelvic ultrasound September 02, 2018.  We will continue to observe.  Ibuprofen for dysmenorrhea.    5. Class 2 obesity due to excess calories without serious comorbidity with body mass index (BMI) of 35.0 to 35.9 in adult Recommend a lower calorie/carb diet such as [employer].  Aerobic physical activities 5 times a week and weightlifting every 2 days.  Jaclyn Fields, it was a pleasure seeing you today!  I will inform you of your results as soon as they are available.

## 2019-01-13 ENCOUNTER — Other Ambulatory Visit: Payer: Self-pay | Admitting: Obstetrics & Gynecology

## 2019-01-13 ENCOUNTER — Telehealth: Payer: Self-pay | Admitting: *Deleted

## 2019-01-13 DIAGNOSIS — E559 Vitamin D deficiency, unspecified: Secondary | ICD-10-CM

## 2019-01-13 LAB — CBC
HCT: 42.6 % (ref 35.0–45.0)
Hemoglobin: 14.2 g/dL (ref 11.7–15.5)
MCH: 31 pg (ref 27.0–33.0)
MCHC: 33.3 g/dL (ref 32.0–36.0)
MCV: 93 fL (ref 80.0–100.0)
MPV: 11 fL (ref 7.5–12.5)
Platelets: 359 10*3/uL (ref 140–400)
RBC: 4.58 10*6/uL (ref 3.80–5.10)
RDW: 11.7 % (ref 11.0–15.0)
WBC: 9.3 10*3/uL (ref 3.8–10.8)

## 2019-01-13 LAB — PAP IG W/ RFLX HPV ASCU

## 2019-01-13 LAB — VITAMIN D 25 HYDROXY (VIT D DEFICIENCY, FRACTURES): Vit D, 25-Hydroxy: 20 ng/mL — ABNORMAL LOW (ref 30–100)

## 2019-01-13 LAB — TSH: TSH: 2.22 mIU/L

## 2019-01-13 NOTE — Telephone Encounter (Signed)
Referral placed at [facility] they will call to schedule.

## 2019-01-13 NOTE — Telephone Encounter (Signed)
-----   Message from [name], MD sent at 01/12/2019 10:30 AM EDT ----- Regarding: Refer to general surgery Right axillary accessory breast tissue with pain and discomfort.

## 2019-01-13 NOTE — Telephone Encounter (Signed)
patient scheduled on 01/21/19 with Dr.Newman

## 2019-02-06 ENCOUNTER — Other Ambulatory Visit: Payer: Self-pay | Admitting: Family Medicine

## 2019-02-06 DIAGNOSIS — J301 Allergic rhinitis due to pollen: Secondary | ICD-10-CM

## 2019-03-12 ENCOUNTER — Other Ambulatory Visit: Payer: Self-pay | Admitting: Family Medicine

## 2019-03-12 DIAGNOSIS — J301 Allergic rhinitis due to pollen: Secondary | ICD-10-CM

## 2019-03-12 DIAGNOSIS — H1013 Acute atopic conjunctivitis, bilateral: Secondary | ICD-10-CM

## 2019-04-03 ENCOUNTER — Other Ambulatory Visit: Payer: Self-pay | Admitting: Family Medicine

## 2019-04-03 DIAGNOSIS — J301 Allergic rhinitis due to pollen: Secondary | ICD-10-CM

## 2019-04-14 ENCOUNTER — Telehealth: Payer: Self-pay

## 2019-04-14 NOTE — Telephone Encounter (Signed)
Patient called complaining of "really bad cramps and lower back pain".  She said it is "the worst ever". She said she has fibroids and "this month has been really, really bad." "Excruciating." Wants to see what can be done.

## 2019-04-14 NOTE — Telephone Encounter (Signed)
Opened another encounter in error and entire phone call is there.

## 2019-04-14 NOTE — Telephone Encounter (Signed)
Menses began Sunday. She states she has a fibroid. She said flow was so heavy she ruined furniture.  Monday she stayed home from work she felt so bad. Tuesday was better and flow stopped. Yesterday started with cramping and low back pain and hurt so bad she did not rest well last night. Was "excruciating".  She is still hurting bad today.  I reviewed your 09/02/2018 note with her. "1. Dysmenorrhea Decision to observe and control with ibuprofen at this time.  The possibility of eventually starting a progestin-only birth control pill to control dysmenorrhea discussed with patient."  She said she is willing to start the progestin only pill now.  She said she is on medication for hypertension and BP is stable. Wanted to be sure would not affect it?  Wanted to know how quick it would work to help her?  Wanted to know what you have seen as far as what works for patients with these issues?

## 2019-04-16 MED ORDER — NORETHINDRONE 0.35 MG PO TABS: 1.0000 | ORAL_TABLET | Freq: Every day | ORAL | 0 refills | Status: DC

## 2019-04-16 NOTE — Telephone Encounter (Signed)
Prescribe the Progestin only BCPs.  Good chance to help flow. Try x 3 months, if not improving the flow enough, schedule a visit with me.  Alternatives are Mirena IUD and DepoProvera.

## 2019-04-16 NOTE — Telephone Encounter (Signed)
Patient informed with below note, Rx sent.

## 2019-05-05 ENCOUNTER — Other Ambulatory Visit: Payer: Self-pay | Admitting: Family Medicine

## 2019-05-05 DIAGNOSIS — J301 Allergic rhinitis due to pollen: Secondary | ICD-10-CM

## 2019-05-10 DIAGNOSIS — M79642 Pain in left hand: Secondary | ICD-10-CM | POA: Diagnosis not present

## 2019-05-16 DIAGNOSIS — J0140 Acute pansinusitis, unspecified: Secondary | ICD-10-CM | POA: Diagnosis not present

## 2019-05-16 DIAGNOSIS — I10 Essential (primary) hypertension: Secondary | ICD-10-CM | POA: Diagnosis not present

## 2019-05-17 ENCOUNTER — Other Ambulatory Visit: Payer: Self-pay | Admitting: Family Medicine

## 2019-05-17 DIAGNOSIS — J301 Allergic rhinitis due to pollen: Secondary | ICD-10-CM

## 2019-05-31 ENCOUNTER — Other Ambulatory Visit: Payer: Self-pay | Admitting: Family Medicine

## 2019-05-31 DIAGNOSIS — J301 Allergic rhinitis due to pollen: Secondary | ICD-10-CM

## 2019-06-04 ENCOUNTER — Other Ambulatory Visit: Payer: Self-pay

## 2019-06-04 DIAGNOSIS — Z20822 Contact with and (suspected) exposure to covid-19: Secondary | ICD-10-CM

## 2019-06-06 LAB — NOVEL CORONAVIRUS, NAA: SARS-CoV-2, NAA: NOT DETECTED

## 2019-06-07 ENCOUNTER — Other Ambulatory Visit: Payer: Self-pay

## 2019-06-07 ENCOUNTER — Ambulatory Visit (INDEPENDENT_AMBULATORY_CARE_PROVIDER_SITE_OTHER): Payer: BC Managed Care – PPO | Admitting: Family Medicine

## 2019-06-07 ENCOUNTER — Encounter: Payer: Self-pay | Admitting: Family Medicine

## 2019-06-07 VITALS — BP 128/76 | HR 86 | Temp 98.6°F | Ht 65.0 in | Wt 221.0 lb

## 2019-06-07 DIAGNOSIS — R059 Cough, unspecified: Secondary | ICD-10-CM

## 2019-06-07 DIAGNOSIS — R05 Cough: Secondary | ICD-10-CM

## 2019-06-07 DIAGNOSIS — J0100 Acute maxillary sinusitis, unspecified: Secondary | ICD-10-CM

## 2019-06-07 MED ORDER — CEFUROXIME AXETIL 250 MG PO TABS: 250.0000 mg | ORAL_TABLET | Freq: Two times a day (BID) | ORAL | 0 refills | Status: DC

## 2019-06-07 MED ORDER — GUAIFENESIN-CODEINE 100-10 MG/5ML PO SYRP: 5.0000 mL | ORAL_SOLUTION | Freq: Four times a day (QID) | ORAL | 0 refills | Status: DC | PRN

## 2019-06-07 NOTE — Progress Notes (Signed)
Date:  06/07/2019   Name:  Jaclyn Fields   DOB:  Jan 12, 1971   MRN:  [id number]   Chief Complaint: Sinusitis (cough, cong- [facility]- prescribed augmentin, flonase and zyrtec. told to also use Afrin with flonase. Started last Thursday getting worse- covid test was negative. has production that is thick with cough. )  Sinusitis This is a new problem. The current episode started in the past 7 days. The problem has been gradually worsening since onset. There has been no fever. The pain is mild. Associated symptoms include congestion, coughing, headaches, sinus pressure and a sore throat. Pertinent negatives include no chills, diaphoresis, ear pain, hoarse voice, neck pain, shortness of breath, sneezing or swollen glands. Past treatments include antibiotics (5 day course augmentin). The treatment provided mild (returned) relief.    Lab Results  Component Value Date   CREATININE 0.87 01/08/2019   BUN 13 01/08/2019   NA 138 01/08/2019   K 3.6 01/08/2019   CL 95 (L) 01/08/2019   CO2 24 01/08/2019   Lab Results  Component Value Date   CHOL 189 04/12/2016   HDL 59 04/12/2016   LDLCALC 112 (H) 04/12/2016   TRIG 92 04/12/2016   CHOLHDL 3.2 04/12/2016   Lab Results  Component Value Date   TSH 2.22 01/12/2019   No results found for: HGBA1C   Review of Systems  Constitutional: Negative for chills, diaphoresis and fever.  HENT: Positive for congestion, sinus pressure and sore throat. Negative for drooling, ear discharge, ear pain, hoarse voice and sneezing.   Respiratory: Positive for cough. Negative for shortness of breath and wheezing.   Cardiovascular: Negative for chest pain, palpitations and leg swelling.  Gastrointestinal: Negative for abdominal pain, blood in stool, constipation, diarrhea and nausea.  Endocrine: Negative for polydipsia.  Genitourinary: Negative for dysuria, frequency, hematuria and urgency.  Musculoskeletal: Negative for back  pain, myalgias and neck pain.  Skin: Negative for rash.  Allergic/Immunologic: Negative for environmental allergies.  Neurological: Positive for headaches. Negative for dizziness.  Hematological: Does not bruise/bleed easily.  Psychiatric/Behavioral: Negative for suicidal ideas. The patient is not nervous/anxious.     Patient Active Problem List   Diagnosis Date Noted  . Essential hypertension 01/02/2018  . Transfusion history 08/12/2012  . Postpartum hemorrhage 09/05/2011  . PP care - s/p 1C/S 3/1 (breech, [facility]) 08/24/2011  . Maternal iron deficiency anemia 08/24/2011  . Benign essential hypertension complicating pregnancy, childbirth, and the puerperium 08/23/2011    Allergies  Allergen Reactions  . Other Other (See Comments)    Other Reaction: SWELLING TO FINGERS  . Sulfa Antibiotics Rash    Other Reaction: SWELLING TO FINGERS    Past Surgical History:  Procedure Laterality Date  . AUGMENTATION MAMMAPLASTY Bilateral 2005  . CESAREAN SECTION    . DILATION AND EVACUATION  09/05/2011   Procedure: DILATATION AND EVACUATION;  Surgeon: Elveria Royals, MD;  Location: [facility];  Service: Gynecology;  Laterality: Bilateral;  . TUBAL LIGATION    . WISDOM TOOTH EXTRACTION      Social History   Tobacco Use  . Smoking status: Never Smoker  . Smokeless tobacco: Never Used  Substance Use Topics  . Alcohol use: Yes    Comment: occasionally  . Drug use: No     Medication list has been reviewed and updated.  Current Meds  Medication Sig  . bimatoprost (LATISSE) 0.03 % ophthalmic solution Place 1 application into both eyes at bedtime. Place  one drop on applicator and apply evenly along the skin of the upper eyelid at base of eyelashes once daily at bedtime; repeat procedure for second eye (use a clean applicator).  . cetirizine (ZYRTEC) 10 MG tablet Take 1 tablet (10 mg total) by mouth daily.  . fluticasone (FLONASE) 50 MCG/ACT nasal spray SPRAY 2 SPRAYS INTO EACH NOSTRIL EVERY  DAY  . hydrochlorothiazide (HYDRODIURIL) 25 MG tablet Take 1 tablet (25 mg total) by mouth daily.  Marland Kitchen ibuprofen (ADVIL,MOTRIN) 800 MG tablet Take 1 tablet (800 mg total) by mouth every 8 (eight) hours as needed.  . meloxicam (MOBIC) 7.5 MG tablet TAKE 1 TABLET BY MOUTH TWICE A DAY  . methocarbamol (ROBAXIN) 500 MG tablet Take 1 tablet (500 mg total) by mouth 2 (two) times daily.  . metoprolol succinate (TOPROL-XL) 100 MG 24 hr tablet Take 1 tablet (100 mg total) by mouth daily. Take with or immediately following a meal.  . norethindrone (MICRONOR) 0.35 MG tablet Take 1 tablet (0.35 mg total) by mouth daily.  . Olopatadine HCl 0.2 % SOLN Apply 1 drop to eye 2 (two) times daily.  . [DISCONTINUED] phentermine 37.5 MG capsule Take 1 capsule (37.5 mg total) by mouth every morning.    PHQ 2/9 Scores 01/08/2019 04/01/2018 01/20/2017 09/05/2015  PHQ - 2 Score 0 0 0 0    BP Readings from Last 3 Encounters:  06/07/19 128/76  01/12/19 134/86  01/08/19 128/80    Physical Exam Vitals and nursing note reviewed.  Constitutional:      Appearance: She is well-developed.  HENT:     Head: Normocephalic.     Jaw: There is normal jaw occlusion.     Right Ear: Hearing, tympanic membrane, ear canal and external ear normal.     Left Ear: Hearing, tympanic membrane, ear canal and external ear normal.     Nose: No congestion or rhinorrhea.     Right Sinus: Maxillary sinus tenderness present. No frontal sinus tenderness.     Left Sinus: Maxillary sinus tenderness present. No frontal sinus tenderness.     Mouth/Throat:     Lips: Pink.     Mouth: Mucous membranes are moist.  Eyes:     General: Lids are everted, no foreign bodies appreciated. No scleral icterus.       Left eye: No foreign body or hordeolum.     Conjunctiva/sclera: Conjunctivae normal.     Right eye: Right conjunctiva is not injected.     Left eye: Left conjunctiva is not injected.     Pupils: Pupils are equal, round, and reactive to light.    Neck:     Thyroid: No thyromegaly.     Vascular: No JVD.     Trachea: No tracheal deviation.  Cardiovascular:     Rate and Rhythm: Normal rate and regular rhythm.     Heart sounds: Normal heart sounds. No murmur. No friction rub. No gallop.   Pulmonary:     Effort: Pulmonary effort is normal. No respiratory distress.     Breath sounds: Normal breath sounds. No stridor. No wheezing, rhonchi or rales.  Chest:     Chest wall: No tenderness.  Abdominal:     General: Bowel sounds are normal.     Palpations: Abdomen is soft. There is no mass.     Tenderness: There is no abdominal tenderness. There is no guarding or rebound.  Musculoskeletal:        General: No swelling, tenderness, deformity or signs of injury. Normal  range of motion.     Cervical back: Normal range of motion and neck supple.     Right lower leg: No edema.     Left lower leg: No edema.  Lymphadenopathy:     Cervical: No cervical adenopathy.  Skin:    General: Skin is warm.     Coloration: Skin is not jaundiced or pale.     Findings: No bruising, erythema, lesion or rash.  Neurological:     Mental Status: She is alert and oriented to person, place, and time.     Cranial Nerves: No cranial nerve deficit.     Deep Tendon Reflexes: Reflexes normal.  Psychiatric:        Mood and Affect: Mood is not anxious or depressed.     Wt Readings from Last 3 Encounters:  06/07/19 221 lb (100.2 kg)  01/12/19 211 lb (95.7 kg)  01/08/19 212 lb (96.2 kg)    BP 128/76   Pulse 86   Temp 98.6 F (37 C) (Oral)   Ht 5\' 5"  (1.651 m)   Wt 221 lb (100.2 kg)   BMI 36.78 kg/m   Assessment and Plan: 1. Acute maxillary sinusitis, recurrence not specified Acute.  Persistent.  Failed on a 5-day course of Augmentin.  Patient has tenderness over both sinuses and more so on the right than the left will initiate Ceftin 250 mg twice a day for 10 days. - cefUROXime (CEFTIN) 250 MG tablet; Take 1 tablet (250 mg total) by mouth 2 (two) times  daily with a meal.  Dispense: 20 tablet; Refill: 0  2. Cough Patient has persistent cough particularly at night we will initiate Robitussin-AC 1 teaspoon every 6 hours for 5 days. - guaiFENesin-codeine (ROBITUSSIN AC) 100-10 MG/5ML syrup; Take 5 mLs by mouth 4 (four) times daily as needed for cough.  Dispense: 118 mL; Refill: 0

## 2019-06-22 DIAGNOSIS — R05 Cough: Secondary | ICD-10-CM | POA: Diagnosis not present

## 2019-06-22 DIAGNOSIS — Z03818 Encounter for observation for suspected exposure to other biological agents ruled out: Secondary | ICD-10-CM | POA: Diagnosis not present

## 2019-06-22 DIAGNOSIS — Z20828 Contact with and (suspected) exposure to other viral communicable diseases: Secondary | ICD-10-CM | POA: Diagnosis not present

## 2019-07-05 DIAGNOSIS — M25532 Pain in left wrist: Secondary | ICD-10-CM | POA: Diagnosis not present

## 2019-07-14 ENCOUNTER — Other Ambulatory Visit: Payer: Self-pay

## 2019-07-14 DIAGNOSIS — I10 Essential (primary) hypertension: Secondary | ICD-10-CM

## 2019-07-14 MED ORDER — METOPROLOL SUCCINATE ER 100 MG PO TB24: 100.0000 mg | ORAL_TABLET | Freq: Every day | ORAL | 0 refills | Status: DC

## 2019-07-14 NOTE — Progress Notes (Signed)
Sent in metoprolol to [facility]

## 2019-07-16 ENCOUNTER — Encounter: Payer: Self-pay | Admitting: Family Medicine

## 2019-07-16 ENCOUNTER — Other Ambulatory Visit: Payer: Self-pay

## 2019-07-16 ENCOUNTER — Ambulatory Visit (INDEPENDENT_AMBULATORY_CARE_PROVIDER_SITE_OTHER): Payer: BC Managed Care – PPO | Admitting: Family Medicine

## 2019-07-16 VITALS — BP 120/80 | HR 72 | Ht 65.0 in | Wt 220.0 lb

## 2019-07-16 DIAGNOSIS — E7801 Familial hypercholesterolemia: Secondary | ICD-10-CM | POA: Diagnosis not present

## 2019-07-16 DIAGNOSIS — I10 Essential (primary) hypertension: Secondary | ICD-10-CM | POA: Diagnosis not present

## 2019-07-16 MED ORDER — METOPROLOL SUCCINATE ER 100 MG PO TB24: 100.0000 mg | ORAL_TABLET | Freq: Every day | ORAL | 1 refills | Status: DC

## 2019-07-16 MED ORDER — HYDROCHLOROTHIAZIDE 25 MG PO TABS: 25.0000 mg | ORAL_TABLET | Freq: Every day | ORAL | 1 refills | Status: DC

## 2019-07-16 MED ORDER — METOPROLOL SUCCINATE ER 100 MG PO TB24: 100.0000 mg | ORAL_TABLET | Freq: Every day | ORAL | 0 refills | Status: DC

## 2019-07-16 NOTE — Progress Notes (Signed)
Date:  07/16/2019   Name:  Jaclyn Fields   DOB:  1971-01-24   MRN:  [id number]   Chief Complaint: Allergic Rhinitis  and Hypertension  Hypertension This is a chronic problem. The current episode started more than 1 year ago. The problem has been gradually improving since onset. The problem is controlled. Pertinent negatives include no anxiety, blurred vision, chest pain, headaches, malaise/fatigue, neck pain, orthopnea, palpitations, peripheral edema, PND, shortness of breath or sweats. There are no associated agents to hypertension. There are no known risk factors for coronary artery disease. Past treatments include beta blockers. The current treatment provides moderate improvement. There are no compliance problems.  There is no history of angina, kidney disease, CAD/MI, CVA, heart failure, left ventricular hypertrophy, PVD or retinopathy. There is no history of chronic renal disease, a hypertension causing med or renovascular disease.    Lab Results  Component Value Date   CREATININE 0.87 01/08/2019   BUN 13 01/08/2019   NA 138 01/08/2019   K 3.6 01/08/2019   CL 95 (L) 01/08/2019   CO2 24 01/08/2019   Lab Results  Component Value Date   CHOL 189 04/12/2016   HDL 59 04/12/2016   LDLCALC 112 (H) 04/12/2016   TRIG 92 04/12/2016   CHOLHDL 3.2 04/12/2016   Lab Results  Component Value Date   TSH 2.22 01/12/2019   No results found for: HGBA1C   Review of Systems  Constitutional: Negative.  Negative for chills, fatigue, fever, malaise/fatigue and unexpected weight change.  HENT: Negative for congestion, ear discharge, ear pain, mouth sores, nosebleeds, rhinorrhea, sinus pressure, sneezing and sore throat.   Eyes: Negative for blurred vision, photophobia, pain, discharge, redness and itching.  Respiratory: Negative for apnea, cough, chest tightness, shortness of breath, wheezing and stridor.   Cardiovascular: Negative for chest pain, palpitations, orthopnea, leg swelling  and PND.  Gastrointestinal: Negative for abdominal pain, blood in stool, constipation, diarrhea, nausea and vomiting.  Endocrine: Negative for cold intolerance, heat intolerance, polydipsia, polyphagia and polyuria.  Genitourinary: Negative for difficulty urinating, dysuria, flank pain, frequency, hematuria, menstrual problem, pelvic pain, urgency, vaginal bleeding and vaginal discharge.  Musculoskeletal: Negative for arthralgias, back pain, myalgias and neck pain.  Skin: Negative for rash.  Allergic/Immunologic: Negative for environmental allergies and food allergies.  Neurological: Negative for dizziness, weakness, light-headedness, numbness and headaches.  Hematological: Negative for adenopathy. Does not bruise/bleed easily.  Psychiatric/Behavioral: Negative for dysphoric mood. The patient is not nervous/anxious.     Patient Active Problem List   Diagnosis Date Noted  . Essential hypertension 01/02/2018  . Transfusion history 08/12/2012  . Postpartum hemorrhage 09/05/2011  . PP care - s/p 1C/S 3/1 (breech, [facility]) 08/24/2011  . Maternal iron deficiency anemia 08/24/2011  . Benign essential hypertension complicating pregnancy, childbirth, and the puerperium 08/23/2011    Allergies  Allergen Reactions  . Other Other (See Comments)    Other Reaction: SWELLING TO FINGERS  . Sulfa Antibiotics Rash    Other Reaction: SWELLING TO FINGERS    Past Surgical History:  Procedure Laterality Date  . AUGMENTATION MAMMAPLASTY Bilateral 2005  . CESAREAN SECTION    . DILATION AND EVACUATION  09/05/2011   Procedure: DILATATION AND EVACUATION;  Surgeon: Elveria Royals, MD;  Location: [facility];  Service: Gynecology;  Laterality: Bilateral;  . TUBAL LIGATION    . WISDOM TOOTH EXTRACTION      Social History   Tobacco Use  . Smoking status: Never Smoker  . Smokeless tobacco: Never  Used  Substance Use Topics  . Alcohol use: Yes    Comment: occasionally  . Drug use: No     Medication list  has been reviewed and updated.  Current Meds  Medication Sig  . bimatoprost (LATISSE) 0.03 % ophthalmic solution Place 1 application into both eyes at bedtime. Place one drop on applicator and apply evenly along the skin of the upper eyelid at base of eyelashes once daily at bedtime; repeat procedure for second eye (use a clean applicator).  . cetirizine (ZYRTEC) 10 MG tablet Take 1 tablet (10 mg total) by mouth daily.  . fluticasone (FLONASE) 50 MCG/ACT nasal spray SPRAY 2 SPRAYS INTO EACH NOSTRIL EVERY DAY  . hydrochlorothiazide (HYDRODIURIL) 25 MG tablet Take 1 tablet (25 mg total) by mouth daily.  Marland Kitchen ibuprofen (ADVIL,MOTRIN) 800 MG tablet Take 1 tablet (800 mg total) by mouth every 8 (eight) hours as needed.  . metoprolol succinate (TOPROL-XL) 100 MG 24 hr tablet Take 1 tablet (100 mg total) by mouth daily. Take with or immediately following a meal.    PHQ 2/9 Scores 01/08/2019 04/01/2018 01/20/2017 09/05/2015  PHQ - 2 Score 0 0 0 0    BP Readings from Last 3 Encounters:  07/16/19 120/80  06/07/19 128/76  01/12/19 134/86    Physical Exam Vitals and nursing note reviewed.  Constitutional:      General: She is not in acute distress.    Appearance: She is not diaphoretic.  HENT:     Head: Normocephalic and atraumatic.     Right Ear: Tympanic membrane, ear canal and external ear normal.     Left Ear: Tympanic membrane, ear canal and external ear normal.     Nose: Nose normal. No congestion or rhinorrhea.     Mouth/Throat:     Mouth: Mucous membranes are moist.  Eyes:     General:        Right eye: No discharge.        Left eye: No discharge.     Conjunctiva/sclera: Conjunctivae normal.     Pupils: Pupils are equal, round, and reactive to light.  Neck:     Thyroid: No thyromegaly.     Vascular: No carotid bruit or JVD.  Cardiovascular:     Rate and Rhythm: Normal rate and regular rhythm.     Heart sounds: Normal heart sounds. No murmur. No friction rub. No gallop.   Pulmonary:      Effort: Pulmonary effort is normal. No respiratory distress.     Breath sounds: Normal breath sounds. No stridor. No wheezing, rhonchi or rales.  Chest:     Chest wall: No tenderness.  Abdominal:     General: Bowel sounds are normal.     Palpations: Abdomen is soft. There is no mass.     Tenderness: There is no abdominal tenderness. There is no guarding.  Musculoskeletal:        General: Normal range of motion.     Cervical back: Normal range of motion and neck supple. No rigidity or tenderness.  Lymphadenopathy:     Cervical: No cervical adenopathy.  Skin:    General: Skin is warm and dry.     Capillary Refill: Capillary refill takes less than 2 seconds.     Coloration: Skin is not jaundiced or pale.  Neurological:     Mental Status: She is alert.     Cranial Nerves: No cranial nerve deficit.     Sensory: No sensory deficit.     Motor:  No weakness.     Coordination: Coordination normal.     Deep Tendon Reflexes: Reflexes are normal and symmetric.     Wt Readings from Last 3 Encounters:  07/16/19 220 lb (99.8 kg)  06/07/19 221 lb (100.2 kg)  01/12/19 211 lb (95.7 kg)    BP 120/80   Pulse 72   Ht 5\' 5"  (1.651 m)   Wt 220 lb (99.8 kg)   LMP 06/30/2019 (Approximate)   BMI 36.61 kg/m   Assessment and Plan:  1. Essential hypertension Chronic.  Controlled.  Stable.  Continue metoprolol XL 100 mg once a day and hydrochlorothiazide 25 mg once a day. - Renal Function Panel - metoprolol succinate (TOPROL-XL) 100 MG 24 hr tablet; Take 1 tablet (100 mg total) by mouth daily. Take with or immediately following a meal.  Dispense: 30 tablet; Refill: 0 - hydrochlorothiazide (HYDRODIURIL) 25 MG tablet; Take 1 tablet (25 mg total) by mouth daily.  Dispense: 90 tablet; Refill: 1  2. Familial hypercholesterolemia Patient has had elevation of LDL in the past and diet has not been followed very well in the past couple weeks.  We will check a lipid panel and adjust accordingly. -  Lipid Panel With LDL/HDL Ratio

## 2019-07-17 LAB — RENAL FUNCTION PANEL
Albumin: 4.1 g/dL (ref 3.8–4.8)
BUN/Creatinine Ratio: 16 (ref 9–23)
BUN: 13 mg/dL (ref 6–24)
CO2: 25 mmol/L (ref 20–29)
Calcium: 9.2 mg/dL (ref 8.7–10.2)
Chloride: 103 mmol/L (ref 96–106)
Creatinine, Ser: 0.8 mg/dL (ref 0.57–1.00)
GFR calc Af Amer: 101 mL/min/{1.73_m2} (ref >59)
GFR calc non Af Amer: 87 mL/min/{1.73_m2} (ref >59)
Glucose: 91 mg/dL (ref 65–99)
Phosphorus: 2.9 mg/dL — ABNORMAL LOW (ref 3.0–4.3)
Potassium: 4.1 mmol/L (ref 3.5–5.2)
Sodium: 142 mmol/L (ref 134–144)

## 2019-07-17 LAB — LIPID PANEL WITH LDL/HDL RATIO
Cholesterol, Total: 205 mg/dL — ABNORMAL HIGH (ref 100–199)
HDL: 51 mg/dL (ref >39)
LDL Chol Calc (NIH): 135 mg/dL — ABNORMAL HIGH (ref 0–99)
LDL/HDL Ratio: 2.6 ratio (ref 0.0–3.2)
Triglycerides: 105 mg/dL (ref 0–149)
VLDL Cholesterol Cal: 19 mg/dL (ref 5–40)

## 2019-07-19 NOTE — Patient Instructions (Signed)

## 2019-07-21 DIAGNOSIS — M25532 Pain in left wrist: Secondary | ICD-10-CM | POA: Diagnosis not present

## 2019-08-24 ENCOUNTER — Other Ambulatory Visit: Payer: Self-pay | Admitting: Family Medicine

## 2019-08-24 DIAGNOSIS — J301 Allergic rhinitis due to pollen: Secondary | ICD-10-CM

## 2019-09-08 ENCOUNTER — Other Ambulatory Visit: Payer: Self-pay

## 2019-09-08 ENCOUNTER — Ambulatory Visit: Payer: BC Managed Care – PPO

## 2019-09-08 DIAGNOSIS — E7801 Familial hypercholesterolemia: Secondary | ICD-10-CM

## 2019-09-13 DIAGNOSIS — E7801 Familial hypercholesterolemia: Secondary | ICD-10-CM | POA: Diagnosis not present

## 2019-09-14 LAB — LIPID PANEL WITH LDL/HDL RATIO
Cholesterol, Total: 183 mg/dL (ref 100–199)
HDL: 45 mg/dL (ref >39)
LDL Chol Calc (NIH): 119 mg/dL — ABNORMAL HIGH (ref 0–99)
LDL/HDL Ratio: 2.6 ratio (ref 0.0–3.2)
Triglycerides: 105 mg/dL (ref 0–149)
VLDL Cholesterol Cal: 19 mg/dL (ref 5–40)

## 2019-09-16 DIAGNOSIS — E079 Disorder of thyroid, unspecified: Secondary | ICD-10-CM | POA: Diagnosis not present

## 2019-11-30 ENCOUNTER — Other Ambulatory Visit: Payer: Self-pay

## 2019-11-30 ENCOUNTER — Ambulatory Visit (INDEPENDENT_AMBULATORY_CARE_PROVIDER_SITE_OTHER): Payer: BC Managed Care – PPO | Admitting: Family Medicine

## 2019-11-30 ENCOUNTER — Ambulatory Visit: Payer: Self-pay

## 2019-11-30 ENCOUNTER — Encounter: Payer: Self-pay | Admitting: Family Medicine

## 2019-11-30 VITALS — BP 138/80 | HR 80 | Ht 65.0 in | Wt 213.0 lb

## 2019-11-30 DIAGNOSIS — R002 Palpitations: Secondary | ICD-10-CM | POA: Diagnosis not present

## 2019-11-30 NOTE — Progress Notes (Signed)
Date:  11/30/2019   Name:  Jaclyn Fields   DOB:  Apr 27, 1971   MRN:  [id number]   Chief Complaint: Palpitations (started friday am while laying in the bed. noticed it every morning while laying in bed- lasts approx couple seconds)  Palpitations  This is a new problem. The current episode started in the past 7 days (past weekend). The problem occurs daily. The problem has been waxing and waning. The symptoms are aggravated by caffeine and pseudoephedrine. Pertinent negatives include no anxiety, chest fullness, chest pain, coughing, diaphoresis, dizziness, fever, irregular heartbeat, nausea, near-syncope, numbness, shortness of breath, vomiting or weakness.    Lab Results  Component Value Date   CREATININE 0.80 07/16/2019   BUN 13 07/16/2019   NA 142 07/16/2019   K 4.1 07/16/2019   CL 103 07/16/2019   CO2 25 07/16/2019   Lab Results  Component Value Date   CHOL 183 09/13/2019   HDL 45 09/13/2019   LDLCALC 119 (H) 09/13/2019   TRIG 105 09/13/2019   CHOLHDL 3.2 04/12/2016   Lab Results  Component Value Date   TSH 2.22 01/12/2019   No results found for: HGBA1C Lab Results  Component Value Date   WBC 9.3 01/12/2019   HGB 14.2 01/12/2019   HCT 42.6 01/12/2019   MCV 93.0 01/12/2019   PLT 359 01/12/2019   Lab Results  Component Value Date   ALT 14 01/29/2018   AST 13 01/29/2018   ALKPHOS 147 (H) 08/23/2011   BILITOT 0.3 01/29/2018     Review of Systems  Constitutional: Negative.  Negative for chills, diaphoresis, fatigue, fever and unexpected weight change.  HENT: Negative for congestion, ear discharge, ear pain, rhinorrhea, sinus pressure, sneezing and sore throat.   Eyes: Negative for photophobia, pain, discharge, redness and itching.  Respiratory: Negative for cough, shortness of breath, wheezing and stridor.   Cardiovascular: Positive for palpitations. Negative for chest pain and near-syncope.  Gastrointestinal: Negative for abdominal pain, blood in stool,  constipation, diarrhea, nausea and vomiting.  Endocrine: Negative for cold intolerance, heat intolerance, polydipsia, polyphagia and polyuria.  Genitourinary: Negative for dysuria, flank pain, frequency, hematuria, menstrual problem, pelvic pain, urgency, vaginal bleeding and vaginal discharge.  Musculoskeletal: Negative for arthralgias, back pain and myalgias.  Skin: Negative for rash.  Allergic/Immunologic: Negative for environmental allergies and food allergies.  Neurological: Negative for dizziness, weakness, light-headedness, numbness and headaches.  Hematological: Negative for adenopathy. Does not bruise/bleed easily.  Psychiatric/Behavioral: Negative for dysphoric mood. The patient is not nervous/anxious.     Patient Active Problem List   Diagnosis Date Noted  . Essential hypertension 01/02/2018  . Transfusion history 08/12/2012  . Postpartum hemorrhage 09/05/2011  . PP care - s/p 1C/S 3/1 (breech, [facility]) 08/24/2011  . Maternal iron deficiency anemia 08/24/2011  . Benign essential hypertension complicating pregnancy, childbirth, and the puerperium 08/23/2011    Allergies  Allergen Reactions  . Other Other (See Comments)    Other Reaction: SWELLING TO FINGERS  . Sulfa Antibiotics Rash    Other Reaction: SWELLING TO FINGERS    Past Surgical History:  Procedure Laterality Date  . AUGMENTATION MAMMAPLASTY Bilateral 2005  . CESAREAN SECTION    . DILATION AND EVACUATION  09/05/2011   Procedure: DILATATION AND EVACUATION;  Surgeon: Elveria Royals, MD;  Location: [facility];  Service: Gynecology;  Laterality: Bilateral;  . TUBAL LIGATION    . WISDOM TOOTH EXTRACTION      Social History   Tobacco Use  . Smoking  status: Never Smoker  . Smokeless tobacco: Never Used  Substance Use Topics  . Alcohol use: Yes    Comment: occasionally  . Drug use: No     Medication list has been reviewed and updated.  Current Meds  Medication Sig  . cetirizine (ZYRTEC) 10 MG tablet Take 1  tablet (10 mg total) by mouth daily.  . fluticasone (FLONASE) 50 MCG/ACT nasal spray SPRAY 2 SPRAYS INTO EACH NOSTRIL EVERY DAY  . hydrochlorothiazide (HYDRODIURIL) 25 MG tablet Take 1 tablet (25 mg total) by mouth daily.  Marland Kitchen ibuprofen (ADVIL,MOTRIN) 800 MG tablet Take 1 tablet (800 mg total) by mouth every 8 (eight) hours as needed.  . metoprolol succinate (TOPROL-XL) 100 MG 24 hr tablet Take 1 tablet (100 mg total) by mouth daily. Take with or immediately following a meal.    PHQ 2/9 Scores 11/30/2019 01/08/2019 04/01/2018 01/20/2017  PHQ - 2 Score 0 0 0 0  PHQ- 9 Score 0 - - -    BP Readings from Last 3 Encounters:  11/30/19 138/80  07/16/19 120/80  06/07/19 128/76    Physical Exam Vitals and nursing note reviewed.  Constitutional:      General: She is not in acute distress.    Appearance: She is not diaphoretic.  HENT:     Head: Normocephalic and atraumatic.     Right Ear: Tympanic membrane, ear canal and external ear normal.     Left Ear: Tympanic membrane, ear canal and external ear normal.     Nose: Nose normal. No congestion or rhinorrhea.     Mouth/Throat:     Mouth: Mucous membranes are moist.  Eyes:     General:        Right eye: No discharge.        Left eye: No discharge.     Conjunctiva/sclera: Conjunctivae normal.     Pupils: Pupils are equal, round, and reactive to light.  Neck:     Thyroid: No thyromegaly.     Vascular: No JVD.  Cardiovascular:     Rate and Rhythm: Normal rate and regular rhythm.     Pulses: Normal pulses.     Heart sounds: Normal heart sounds. No murmur. No friction rub. No gallop.   Pulmonary:     Effort: Pulmonary effort is normal. No respiratory distress.     Breath sounds: Normal breath sounds. No stridor. No wheezing, rhonchi or rales.  Chest:     Chest wall: No tenderness.  Abdominal:     General: Bowel sounds are normal.     Palpations: Abdomen is soft. There is no mass.     Tenderness: There is no abdominal tenderness. There is  no guarding or rebound.  Musculoskeletal:        General: Normal range of motion.     Cervical back: Normal range of motion and neck supple.  Lymphadenopathy:     Cervical: No cervical adenopathy.  Skin:    General: Skin is warm and dry.     Capillary Refill: Capillary refill takes less than 2 seconds.  Neurological:     Mental Status: She is alert.     Deep Tendon Reflexes: Reflexes are normal and symmetric.     Wt Readings from Last 3 Encounters:  11/30/19 213 lb (96.6 kg)  07/16/19 220 lb (99.8 kg)  06/07/19 221 lb (100.2 kg)    BP 138/80   Pulse 80   Ht 5\' 5"  (1.651 m)   Wt 213 lb (96.6 kg)  LMP 11/01/2019 (Approximate)   BMI 35.45 kg/m   Assessment and Plan:  1. Palpitation New onset.  None recurrent.  Stable. Ms. Endia Moncur to beats close together 1 morning.  Patient has been drinking more coffee/caffeine than usual and may have had some Sudafed in her child's cold preparation.  In any event none have been sustained more than 2 seconds.  EKG noted today the following:I have reviewed EKG which shows rate 71 PR interval QT interval QRS interval normal rhythm normal there was no PVCs or PACs.  No signs of LVH noted indication of ischemia such as Q waves, ST T wave changes, nor late R wave progression.. Comparison to previous EKG dated 2018 was unchanged.  Discussed with patient that these are probably benign in nature either may be a occasional premature contraction.  And that the fact that there has not been any sustained dysrhythmia is good.  Patient will cut back on caffeine and avoid Sudafed when possible. - EKG 12-Lead

## 2019-11-30 NOTE — Telephone Encounter (Signed)
Pt called c/o new onset of palpitations. Pt stated that her sx started Friday of last week. Pt stated they occur in the morning. Pt denies chest pain, dizziness or any SOB. Pt stated that occasionally that she has to cough during the palpitations. Pt denies heart hx, thyroid issues. Pt stated there is a strong familiar hx in her family history. Pt stated she was drinking coffee and her palpitations worsened , so she stopped the caffeine and drank water and the palpitations subsided.  Pt stated the palpitations come and go.  Care advice given and pt verbalized understanding. PEC agent scheduled pt to see her PCP at 1620 today.   Reason for Disposition . [1] Palpitations AND [2] no improvement after using CARE ADVICE  Answer Assessment - Initial Assessment Questions 1. DESCRIPTION: "Please describe your heart rate or heart beat that you are having" (e.g., fast/slow, regular/irregular, skipped or extra beats, "palpitations")     flutter 2. ONSET: "When did it start?" (Minutes, hours or days)     Friday 3. DURATION: "How long does it last" (e.g., seconds, minutes, hours)     0645 in am getting ready to get up second- last for a few minutes no longer than 30 minutes 4. PATTERN "Does it come and go, or has it been constant since it started?"  "Does it get worse with exertion?"   "Are you feeling it now?"     Comes and goes in am. Worse with caffiene intake 5. TAP: "Using your hand, can you tap out what you are feeling on a chair or table in front of you, so that I can hear?" (Note: not all patients can do this)       Pt driving but stated it feels irregular 6. HEART RATE: "Can you tell me your heart rate?" "How many beats in 15 seconds?"  (Note: not all patients can do this)      Driving 7. RECURRENT SYMPTOM: "Have you ever had this before?" If so, ask: "When was the last time?" and "What happened that time?"      no 8. CAUSE: "What do you think is causing the palpitations?"     Pt doesn't know 9.  CARDIAC HISTORY: "Do you have any history of heart disease?" (e.g., heart attack, angina, bypass surgery, angioplasty, arrhythmia)      No- strong familial hx 10. OTHER SYMPTOMS: "Do you have any other symptoms?" (e.g., dizziness, chest pain, sweating, difficulty breathing)      cough occasionaally  11. PREGNANCY: "Is there any chance you are pregnant?" "When was your last menstrual period?"       No-LMP:due any time  Protocols used: HEART RATE AND HEARTBEAT QUESTIONS-A-AH

## 2019-11-30 NOTE — Patient Instructions (Signed)

## 2020-01-13 ENCOUNTER — Encounter: Payer: BC Managed Care – PPO | Admitting: Obstetrics & Gynecology

## 2020-01-13 DIAGNOSIS — Z0289 Encounter for other administrative examinations: Secondary | ICD-10-CM

## 2020-01-14 ENCOUNTER — Ambulatory Visit: Payer: BC Managed Care – PPO | Admitting: Family Medicine

## 2020-01-21 ENCOUNTER — Ambulatory Visit (INDEPENDENT_AMBULATORY_CARE_PROVIDER_SITE_OTHER): Payer: BC Managed Care – PPO | Admitting: Family Medicine

## 2020-01-21 ENCOUNTER — Other Ambulatory Visit: Payer: Self-pay

## 2020-01-21 ENCOUNTER — Encounter: Payer: Self-pay | Admitting: Family Medicine

## 2020-01-21 VITALS — BP 130/80 | HR 76 | Ht 63.0 in | Wt 213.0 lb

## 2020-01-21 DIAGNOSIS — E7801 Familial hypercholesterolemia: Secondary | ICD-10-CM | POA: Diagnosis not present

## 2020-01-21 DIAGNOSIS — M1990 Unspecified osteoarthritis, unspecified site: Secondary | ICD-10-CM

## 2020-01-21 DIAGNOSIS — I10 Essential (primary) hypertension: Secondary | ICD-10-CM | POA: Diagnosis not present

## 2020-01-21 DIAGNOSIS — J301 Allergic rhinitis due to pollen: Secondary | ICD-10-CM | POA: Diagnosis not present

## 2020-01-21 DIAGNOSIS — N946 Dysmenorrhea, unspecified: Secondary | ICD-10-CM | POA: Diagnosis not present

## 2020-01-21 MED ORDER — HYDROCHLOROTHIAZIDE 25 MG PO TABS: 25.0000 mg | ORAL_TABLET | Freq: Every day | ORAL | 1 refills | Status: DC

## 2020-01-21 MED ORDER — BIMATOPROST 0.03 % EX SOLN: 1.0000 "application " | Freq: Every day | CUTANEOUS | 12 refills | Status: DC

## 2020-01-21 MED ORDER — IBUPROFEN 800 MG PO TABS: 800.0000 mg | ORAL_TABLET | Freq: Three times a day (TID) | ORAL | 2 refills | Status: DC | PRN

## 2020-01-21 MED ORDER — METOPROLOL SUCCINATE ER 100 MG PO TB24: 100.0000 mg | ORAL_TABLET | Freq: Every day | ORAL | 1 refills | Status: DC

## 2020-01-21 MED ORDER — FLUTICASONE PROPIONATE 50 MCG/ACT NA SUSP: NASAL | 11 refills | Status: DC

## 2020-01-21 NOTE — Progress Notes (Signed)
Date:  01/21/2020   Name:  Jaclyn Fields   DOB:  08/04/1970   MRN:  [id number]   Chief Complaint: Allergic Rhinitis , Hypertension, and stomach cramps (uses during period)  Hypertension This is a chronic problem. The current episode started more than 1 year ago. The problem has been gradually improving since onset. The problem is controlled. Pertinent negatives include no anxiety, blurred vision, chest pain, headaches, malaise/fatigue, neck pain, orthopnea, palpitations, peripheral edema, PND, shortness of breath or sweats. There are no associated agents to hypertension. Risk factors for coronary artery disease include dyslipidemia. Past treatments include beta blockers and diuretics. The current treatment provides moderate improvement. There are no compliance problems.  There is no history of angina, kidney disease, CAD/MI, CVA, heart failure, left ventricular hypertrophy, PVD or retinopathy. There is no history of chronic renal disease, a hypertension causing med or renovascular disease.  URI  This is a recurrent (for allergic rhinitis) problem. The current episode started more than 1 year ago. The problem has been waxing and waning. There has been no fever. Associated symptoms include congestion and rhinorrhea. Pertinent negatives include no abdominal pain, chest pain, coughing, diarrhea, dysuria, ear pain, headaches, joint pain, joint swelling, nausea, neck pain, plugged ear sensation, rash, sinus pain, sneezing, sore throat, swollen glands, vomiting or wheezing. She has tried antihistamine for the symptoms. The treatment provided mild relief.    Lab Results  Component Value Date   CREATININE 0.80 07/16/2019   BUN 13 07/16/2019   NA 142 07/16/2019   K 4.1 07/16/2019   CL 103 07/16/2019   CO2 25 07/16/2019   Lab Results  Component Value Date   CHOL 183 09/13/2019   HDL 45 09/13/2019   LDLCALC 119 (H) 09/13/2019   TRIG 105 09/13/2019   CHOLHDL 3.2 04/12/2016   Lab Results    Component Value Date   TSH 2.22 01/12/2019   No results found for: HGBA1C Lab Results  Component Value Date   WBC 9.3 01/12/2019   HGB 14.2 01/12/2019   HCT 42.6 01/12/2019   MCV 93.0 01/12/2019   PLT 359 01/12/2019   Lab Results  Component Value Date   ALT 14 01/29/2018   AST 13 01/29/2018   ALKPHOS 147 (H) 08/23/2011   BILITOT 0.3 01/29/2018     Review of Systems  Constitutional: Negative.  Negative for chills, fatigue, fever, malaise/fatigue and unexpected weight change.  HENT: Positive for congestion and rhinorrhea. Negative for ear discharge, ear pain, sinus pressure, sinus pain, sneezing and sore throat.   Eyes: Negative for blurred vision, photophobia, pain, discharge, redness and itching.  Respiratory: Negative for cough, shortness of breath, wheezing and stridor.   Cardiovascular: Negative for chest pain, palpitations, orthopnea and PND.  Gastrointestinal: Negative for abdominal pain, blood in stool, constipation, diarrhea, nausea and vomiting.  Endocrine: Negative for cold intolerance, heat intolerance, polydipsia, polyphagia and polyuria.  Genitourinary: Negative for dysuria, flank pain, frequency, hematuria, menstrual problem, pelvic pain, urgency, vaginal bleeding and vaginal discharge.  Musculoskeletal: Negative for arthralgias, back pain, joint pain, myalgias and neck pain.  Skin: Negative for rash.  Allergic/Immunologic: Negative for environmental allergies and food allergies.  Neurological: Negative for dizziness, weakness, light-headedness, numbness and headaches.  Hematological: Negative for adenopathy. Does not bruise/bleed easily.  Psychiatric/Behavioral: Negative for dysphoric mood. The patient is not nervous/anxious.     Patient Active Problem List   Diagnosis Date Noted  . Essential hypertension 01/02/2018  . Transfusion history 08/12/2012  . Postpartum hemorrhage  09/05/2011  . PP care - s/p 1C/S 3/1 (breech, [facility]) 08/24/2011  . Maternal iron  deficiency anemia 08/24/2011  . Benign essential hypertension complicating pregnancy, childbirth, and the puerperium 08/23/2011    Allergies  Allergen Reactions  . Other Other (See Comments)    Other Reaction: SWELLING TO FINGERS  . Sulfa Antibiotics Rash    Other Reaction: SWELLING TO FINGERS    Past Surgical History:  Procedure Laterality Date  . AUGMENTATION MAMMAPLASTY Bilateral 2005  . CESAREAN SECTION    . DILATION AND EVACUATION  09/05/2011   Procedure: DILATATION AND EVACUATION;  Surgeon: Elveria Royals, MD;  Location: [facility];  Service: Gynecology;  Laterality: Bilateral;  . TUBAL LIGATION    . WISDOM TOOTH EXTRACTION      Social History   Tobacco Use  . Smoking status: Never Smoker  . Smokeless tobacco: Never Used  Vaping Use  . Vaping Use: Never used  Substance Use Topics  . Alcohol use: Yes    Comment: occasionally  . Drug use: No     Medication list has been reviewed and updated.  Current Meds  Medication Sig  . cetirizine (ZYRTEC) 10 MG tablet Take 1 tablet (10 mg total) by mouth daily.  . fluticasone (FLONASE) 50 MCG/ACT nasal spray SPRAY 2 SPRAYS INTO EACH NOSTRIL EVERY DAY  . hydrochlorothiazide (HYDRODIURIL) 25 MG tablet Take 1 tablet (25 mg total) by mouth daily.  Marland Kitchen ibuprofen (ADVIL,MOTRIN) 800 MG tablet Take 1 tablet (800 mg total) by mouth every 8 (eight) hours as needed.  . metoprolol succinate (TOPROL-XL) 100 MG 24 hr tablet Take 1 tablet (100 mg total) by mouth daily. Take with or immediately following a meal.    PHQ 2/9 Scores 01/21/2020 11/30/2019 01/08/2019 04/01/2018  PHQ - 2 Score 0 0 0 0  PHQ- 9 Score 0 0 - -    GAD 7 : Generalized Anxiety Score 01/21/2020 11/30/2019  Nervous, Anxious, on Edge 0 0  Control/stop worrying 0 0  Worry too much - different things 0 0  Trouble relaxing 0 0  Restless 0 0  Easily annoyed or irritable 0 0  Afraid - awful might happen 0 0  Total GAD 7 Score 0 0    BP Readings from Last 3 Encounters:  01/21/20  (!) 130/80  11/30/19 138/80  07/16/19 120/80    Physical Exam Vitals and nursing note reviewed.  Constitutional:      Appearance: She is well-developed.  HENT:     Head: Normocephalic.     Right Ear: Tympanic membrane, ear canal and external ear normal.     Left Ear: Tympanic membrane, ear canal and external ear normal.     Nose: Congestion present.     Mouth/Throat:     Mouth: Mucous membranes are moist.  Eyes:     General: Lids are everted, no foreign bodies appreciated. No scleral icterus.       Left eye: No foreign body or hordeolum.     Conjunctiva/sclera: Conjunctivae normal.     Right eye: Right conjunctiva is not injected.     Left eye: Left conjunctiva is not injected.     Pupils: Pupils are equal, round, and reactive to light.  Neck:     Thyroid: No thyromegaly.     Vascular: No JVD.     Trachea: No tracheal deviation.  Cardiovascular:     Rate and Rhythm: Normal rate and regular rhythm.     Heart sounds: Normal heart sounds. No murmur  heard.  No friction rub. No gallop.   Pulmonary:     Effort: Pulmonary effort is normal. No respiratory distress.     Breath sounds: Normal breath sounds. No wheezing or rales.  Abdominal:     General: Bowel sounds are normal.     Palpations: Abdomen is soft. There is no mass.     Tenderness: There is no abdominal tenderness. There is no guarding or rebound.  Musculoskeletal:        General: No tenderness. Normal range of motion.     Cervical back: Normal range of motion and neck supple.  Lymphadenopathy:     Cervical: No cervical adenopathy.  Skin:    General: Skin is warm.     Findings: No rash.  Neurological:     Mental Status: She is alert and oriented to person, place, and time.     Cranial Nerves: No cranial nerve deficit.     Deep Tendon Reflexes: Reflexes normal.  Psychiatric:        Mood and Affect: Mood is not anxious or depressed.     Wt Readings from Last 3 Encounters:  01/21/20 (!) 213 lb (96.6 kg)    11/30/19 213 lb (96.6 kg)  07/16/19 220 lb (99.8 kg)    BP (!) 130/80   Pulse 76   Ht 5\' 3"  (1.6 m)   Wt (!) 213 lb (96.6 kg)   LMP 01/12/2020 (Approximate)   BMI 37.73 kg/m   Assessment and Plan:  1. Essential hypertension Chronic.  Controlled.  Stable.  Continue hydrochlorothiazide 25 mg and metoprolol XL 100 mg once a day.  Will check CMP for status of electrolytes and GFR. - hydrochlorothiazide (HYDRODIURIL) 25 MG tablet; Take 1 tablet (25 mg total) by mouth daily.  Dispense: 90 tablet; Refill: 1 - metoprolol succinate (TOPROL-XL) 100 MG 24 hr tablet; Take 1 tablet (100 mg total) by mouth daily. Take with or immediately following a meal.  Dispense: 90 tablet; Refill: 1 - Comprehensive Metabolic Panel (CMET)  2. Seasonal allergic rhinitis due to pollen Chronic.  Controlled.  Stable.  Continue fluticasone 2 sprays in each nostril each day as needed for allergy control.  Patient also nightly OTC antihistamine. - fluticasone (FLONASE) 50 MCG/ACT nasal spray; SPRAY 2 SPRAYS INTO EACH NOSTRIL EVERY DAY  Dispense: 48 mL; Refill: 11  3. Arthritis Episodic.  Controlled with Tylenol.  Patient may take ibuprofen as needed if breakthrough.  4. Dysmenorrhea Chronic.  Episodic.  Stable.  Patient will continue ibuprofen 800 mg every 8 hours as needed 3 days prior to.  And through.  Aspect. - ibuprofen (ADVIL) 800 MG tablet; Take 1 tablet (800 mg total) by mouth every 8 (eight) hours as needed.  Dispense: 90 tablet; Refill: 2  5. Familial hypercholesterolemia Chronic.  Controlled.  Stable.  Will check lipid panel for evaluation of LDL status. - Lipid Panel With LDL/HDL Ratio

## 2020-01-21 NOTE — Patient Instructions (Signed)
Mediterranean Diet A Mediterranean diet refers to food and lifestyle choices that are based on the traditions of countries located on the [place]. This way of eating has been shown to help prevent certain conditions and improve outcomes for people who have chronic diseases, like kidney disease and heart disease. What are tips for following this plan? Lifestyle  Cook and eat meals together with your family, when possible.  Drink enough fluid to keep your urine clear or pale yellow.  Be physically active every day. This includes: ? Aerobic exercise like running or swimming. ? Leisure activities like gardening, walking, or housework.  Get 7-8 hours of sleep each night.  If recommended by your health care provider, drink red wine in moderation. This means 1 glass a day for nonpregnant women and 2 glasses a day for men. A glass of wine equals 5 oz (150 mL). Reading food labels  Check the serving size of packaged foods. For foods such as rice and pasta, the serving size refers to the amount of cooked product, not dry.  Check the total fat in packaged foods. Avoid foods that have saturated fat or trans fats.  Check the ingredients list for added sugars, such as corn syrup.   Shopping  At the grocery store, buy most of your food from the areas near the walls of the store. This includes: ? Fresh fruits and vegetables (produce). ? Grains, beans, nuts, and seeds. Some of these may be available in unpackaged forms or large amounts (in bulk). ? Fresh seafood. ? Poultry and eggs. ? Low-fat dairy products.  Buy whole ingredients instead of prepackaged foods.  Buy fresh fruits and vegetables in-season from local farmers markets.  Buy frozen fruits and vegetables in resealable bags.  If you do not have access to quality fresh seafood, buy precooked frozen shrimp or canned fish, such as tuna, salmon, or sardines.  Buy small amounts of raw or cooked vegetables, salads, or olives from  the deli or salad bar at your store.  Stock your pantry so you always have certain foods on hand, such as olive oil, canned tuna, canned tomatoes, rice, pasta, and beans. Cooking  Cook foods with extra-virgin olive oil instead of using butter or other vegetable oils.  Have meat as a side dish, and have vegetables or grains as your main dish. This means having meat in small portions or adding small amounts of meat to foods like pasta or stew.  Use beans or vegetables instead of meat in common dishes like chili or lasagna.  Experiment with different cooking methods. Try roasting or broiling vegetables instead of steaming or sauteing them.  Add frozen vegetables to soups, stews, pasta, or rice.  Add nuts or seeds for added healthy fat at each meal. You can add these to yogurt, salads, or vegetable dishes.  Marinate fish or vegetables using olive oil, lemon juice, garlic, and fresh herbs. Meal planning  Plan to eat 1 vegetarian meal one day each week. Try to work up to 2 vegetarian meals, if possible.  Eat seafood 2 or more times a week.  Have healthy snacks readily available, such as: ? Vegetable sticks with hummus. ? Greek yogurt. ? Fruit and nut trail mix.  Eat balanced meals throughout the week. This includes: ? Fruit: 2-3 servings a day ? Vegetables: 4-5 servings a day ? Low-fat dairy: 2 servings a day ? Fish, poultry, or lean meat: 1 serving a day ? Beans and legumes: 2 or more servings a week ?   Nuts and seeds: 1-2 servings a day ? Whole grains: 6-8 servings a day ? Extra-virgin olive oil: 3-4 servings a day  Limit red meat and sweets to only a few servings a month   What are my food choices?  Mediterranean diet ? Recommended  Grains: Whole-grain pasta. Brown rice. Bulgar wheat. Polenta. Couscous. Whole-wheat bread. Oatmeal. Quinoa.  Vegetables: Artichokes. Beets. Broccoli. Cabbage. Carrots. Eggplant. Green beans. Chard. Kale. Spinach. Onions. Leeks. Peas. Squash.  Tomatoes. Peppers. Radishes.  Fruits: Apples. Apricots. Avocado. Berries. Bananas. Cherries. Dates. Figs. Grapes. Lemons. Melon. Oranges. Peaches. Plums. Pomegranate.  Meats and other protein foods: Beans. Almonds. Sunflower seeds. Pine nuts. Peanuts. Cod. Salmon. Scallops. Shrimp. Tuna. Tilapia. Clams. Oysters. Eggs.  Dairy: Low-fat milk. Cheese. Greek yogurt.  Beverages: Water. Red wine. Herbal tea.  Fats and oils: Extra virgin olive oil. Avocado oil. Grape seed oil.  Sweets and desserts: Greek yogurt with honey. Baked apples. Poached pears. Trail mix.  Seasoning and other foods: Basil. Cilantro. Coriander. Cumin. Mint. Parsley. Sage. Rosemary. Tarragon. Garlic. Oregano. Thyme. Pepper. Balsalmic vinegar. Tahini. Hummus. Tomato sauce. Olives. Mushrooms. ? Limit these  Grains: Prepackaged pasta or rice dishes. Prepackaged cereal with added sugar.  Vegetables: Deep fried potatoes (french fries).  Fruits: Fruit canned in syrup.  Meats and other protein foods: Beef. Pork. Lamb. Poultry with skin. Hot dogs. Bacon.  Dairy: Ice cream. Sour cream. Whole milk.  Beverages: Juice. Sugar-sweetened soft drinks. Beer. Liquor and spirits.  Fats and oils: Butter. Canola oil. Vegetable oil. Beef fat (tallow). Lard.  Sweets and desserts: Cookies. Cakes. Pies. Candy.  Seasoning and other foods: Mayonnaise. Premade sauces and marinades. The items listed may not be a complete list. Talk with your dietitian about what dietary choices are right for you. Summary  The Mediterranean diet includes both food and lifestyle choices.  Eat a variety of fresh fruits and vegetables, beans, nuts, seeds, and whole grains.  Limit the amount of red meat and sweets that you eat.  Talk with your health care provider about whether it is safe for you to drink red wine in moderation. This means 1 glass a day for nonpregnant women and 2 glasses a day for men. A glass of wine equals 5 oz (150 mL). This information  is not intended to replace advice given to you by your health care provider. Make sure you discuss any questions you have with your health care provider. Document Revised: 02/08/2016 Document Reviewed: 02/01/2016 Elsevier Patient Education  2020 [employer] Inc.  

## 2020-01-22 LAB — COMPREHENSIVE METABOLIC PANEL
ALT: 14 IU/L (ref 0–32)
AST: 18 IU/L (ref 0–40)
Albumin/Globulin Ratio: 1.4 (ref 1.2–2.2)
Albumin: 4.1 g/dL (ref 3.8–4.8)
Alkaline Phosphatase: 106 IU/L (ref 48–121)
BUN/Creatinine Ratio: 24 — ABNORMAL HIGH (ref 9–23)
BUN: 20 mg/dL (ref 6–24)
Bilirubin Total: 0.3 mg/dL (ref 0.0–1.2)
CO2: 24 mmol/L (ref 20–29)
Calcium: 9.1 mg/dL (ref 8.7–10.2)
Chloride: 101 mmol/L (ref 96–106)
Creatinine, Ser: 0.85 mg/dL (ref 0.57–1.00)
GFR calc Af Amer: 94 mL/min/{1.73_m2} (ref >59)
GFR calc non Af Amer: 81 mL/min/{1.73_m2} (ref >59)
Globulin, Total: 3 g/dL (ref 1.5–4.5)
Glucose: 103 mg/dL — ABNORMAL HIGH (ref 65–99)
Potassium: 3.8 mmol/L (ref 3.5–5.2)
Sodium: 140 mmol/L (ref 134–144)
Total Protein: 7.1 g/dL (ref 6.0–8.5)

## 2020-01-22 LAB — LIPID PANEL WITH LDL/HDL RATIO
Cholesterol, Total: 182 mg/dL (ref 100–199)
HDL: 53 mg/dL (ref >39)
LDL Chol Calc (NIH): 112 mg/dL — ABNORMAL HIGH (ref 0–99)
LDL/HDL Ratio: 2.1 ratio (ref 0.0–3.2)
Triglycerides: 93 mg/dL (ref 0–149)
VLDL Cholesterol Cal: 17 mg/dL (ref 5–40)

## 2020-03-03 ENCOUNTER — Encounter: Payer: Self-pay | Admitting: Obstetrics & Gynecology

## 2020-03-03 ENCOUNTER — Other Ambulatory Visit: Payer: Self-pay

## 2020-03-03 ENCOUNTER — Ambulatory Visit (INDEPENDENT_AMBULATORY_CARE_PROVIDER_SITE_OTHER): Payer: BC Managed Care – PPO | Admitting: Obstetrics & Gynecology

## 2020-03-03 VITALS — BP 140/90

## 2020-03-03 DIAGNOSIS — D219 Benign neoplasm of connective and other soft tissue, unspecified: Secondary | ICD-10-CM

## 2020-03-03 DIAGNOSIS — Z9851 Tubal ligation status: Secondary | ICD-10-CM | POA: Diagnosis not present

## 2020-03-03 DIAGNOSIS — N920 Excessive and frequent menstruation with regular cycle: Secondary | ICD-10-CM

## 2020-03-03 DIAGNOSIS — Z23 Encounter for immunization: Secondary | ICD-10-CM

## 2020-03-03 LAB — CBC
HCT: 39.4 % (ref 35.0–45.0)
Hemoglobin: 13.2 g/dL (ref 11.7–15.5)
MCH: 31.1 pg (ref 27.0–33.0)
MCHC: 33.5 g/dL (ref 32.0–36.0)
MCV: 92.7 fL (ref 80.0–100.0)
MPV: 10.7 fL (ref 7.5–12.5)
Platelets: 354 10*3/uL (ref 140–400)
RBC: 4.25 10*6/uL (ref 3.80–5.10)
RDW: 11.7 % (ref 11.0–15.0)
WBC: 10 10*3/uL (ref 3.8–10.8)

## 2020-03-03 LAB — TSH: TSH: 1.73 mIU/L

## 2020-03-03 NOTE — Progress Notes (Signed)
    Jaclyn Fields 1970/09/13 [id number]        49 y.o.  G2P1011 Married.  S/P TL.  RP:  Progressive Menorrhagia  HPI: Known IM Fibroid.  Heavy bleeding with menses, worsened this cycle with persistent heavy flow with blood clots on day #7 yesterday.  Lighter flow so far today.  Pelvic cramps stronger than usual this cycle.  No BTB.  No abnormal vaginal d/c.   OB History  Gravida Para Term Preterm AB Living  2 1 1   1 1   SAB TAB Ectopic Multiple Live Births    1     1    # Outcome Date GA Lbr Len/2nd Weight Sex Delivery Anes PTL Lv  2 Term 08/23/11 [redacted]w[redacted]d  6 lb 10.9 oz (3.03 kg) F CS-LTranv Spinal  LIV  1 TAB             Past medical history,surgical history, problem list, medications, allergies, family history and social history were all reviewed and documented in the EPIC chart.   Directed ROS with pertinent positives and negatives documented in the history of present illness/assessment and plan.  Exam:  Vitals:   03/03/20 0943  BP: 140/90   General appearance:  Normal  Pelvic US 08/2018: T/V and T/Aimages. Mildly enlarged anteverted uterus measuring 9.74 x 6.39 x 5.21 cm. Intramural fibroid measuring 4.2 x 4.2 x 4.4 cm displacing the endometrium. Endometrial lining normal at 8.4 mm. Right ovary normal. Left ovary normal seen bestwith T/Aimages, T/V images limited. No free fluid in the posterior cul-de-sac.  Abdomen: Normal  Gynecologic exam: Vulva normal.  Bimanual exam: Anteverted uterus about 9 to 10 cm in diameter, mobile, nontender.  No adnexal mass, nontender bilaterally.  Very mild menstrual blood present.  No blood clots.   Assessment/Plan:  49 y.o. G2P1011   1. Menorrhagia with regular cycle Progressive menorrhagia with known intramural fibroid per last pelvic ultrasound March 2020.  Will rule out anemia with a CBC today.  TSH to rule out thyroid dysfunction.  Follow-up pelvic ultrasound to reassess for uterine fibroids and the endometrial lining.   We will make a decision on management per findings.  Progestin treatment and surgical approaches reviewed today. - CBC - TSH - US Transvaginal Non-OB; Future  2. Fibroid - US Transvaginal Non-OB; Future  3. S/P tubal ligation  [name] MD, 10:22 AM 03/03/2020

## 2020-03-06 ENCOUNTER — Telehealth: Payer: Self-pay

## 2020-03-06 MED ORDER — MEGESTROL ACETATE 40 MG PO TABS: 40.0000 mg | ORAL_TABLET | Freq: Two times a day (BID) | ORAL | 0 refills | Status: DC

## 2020-03-06 NOTE — Telephone Encounter (Signed)
Patient informed. Rx sent 

## 2020-03-06 NOTE — Telephone Encounter (Signed)
Has u/s scheduled next Tuesday 03/14/20.  She said you told her to call you the week before surgery if still bleeding. Her bleeding persists.

## 2020-03-06 NOTE — Telephone Encounter (Signed)
Start on Megace 40 mg BID until Pelvic US appointment.

## 2020-03-14 ENCOUNTER — Encounter: Payer: Self-pay | Admitting: Obstetrics & Gynecology

## 2020-03-14 ENCOUNTER — Ambulatory Visit (INDEPENDENT_AMBULATORY_CARE_PROVIDER_SITE_OTHER): Payer: BC Managed Care – PPO

## 2020-03-14 ENCOUNTER — Ambulatory Visit (INDEPENDENT_AMBULATORY_CARE_PROVIDER_SITE_OTHER): Payer: BC Managed Care – PPO | Admitting: Obstetrics & Gynecology

## 2020-03-14 ENCOUNTER — Other Ambulatory Visit: Payer: Self-pay

## 2020-03-14 VITALS — BP 140/80

## 2020-03-14 DIAGNOSIS — D219 Benign neoplasm of connective and other soft tissue, unspecified: Secondary | ICD-10-CM

## 2020-03-14 DIAGNOSIS — N854 Malposition of uterus: Secondary | ICD-10-CM

## 2020-03-14 DIAGNOSIS — N920 Excessive and frequent menstruation with regular cycle: Secondary | ICD-10-CM | POA: Diagnosis not present

## 2020-03-14 DIAGNOSIS — R350 Frequency of micturition: Secondary | ICD-10-CM | POA: Diagnosis not present

## 2020-03-14 NOTE — Progress Notes (Signed)
    Jaclyn Fields [birth year] [id number]        49 y.o.  G2P10 Married.  S/P TL.  RP:  Progressive Menorrhagia for Pelvic US   HPI: No change x last visit 03/03/2020 when we noted:  Known IM Fibroid.  Heavy bleeding with menses, worsened this cycle with persistent heavy flow with blood clots on day #7 yesterday.  Lighter flow so far today.  Pelvic cramps stronger than usual this cycle.  No BTB.  No abnormal vaginal d/c.  Hb 13.2 and TSH normal on 03/03/2020.                      OB History  Gravida Para Term Preterm AB Living  2 1 1   1 1   SAB TAB Ectopic Multiple Live Births    1     1    # Outcome Date GA Lbr Len/2nd Weight Sex Delivery Anes PTL Lv  2 Term 08/23/11 [redacted]w[redacted]d  6 lb 10.9 oz (3.03 kg) F CS-LTranv Spinal  LIV  1 TAB             Past medical history,surgical history, problem list, medications, allergies, family history and social history were all reviewed and documented in the EPIC chart.   Directed ROS with pertinent positives and negatives documented in the history of present illness/assessment and plan.  Exam:  Vitals:   03/14/20 1019  BP: 140/80   General appearance:  Normal  Pelvic US today: T/V images. Anteverted uterus enlarged with an intramural fibroid measured at 6.8 x 7.1 cm on the left aspect of the uterus. The fibroid is increased in size compared to Sep 18, 2018 when it was measured at 4.2 x 4.4 cm. The overall uterine size is measured at 11.01 x 7.26 x 5.85 cm. The endometrial lining is measured at 7.53 mm with a sliver of free fluid towards the right endometrium, Doppler flow is seen at the left endometrium without a mass, the endometrium is distorted due to the left fibroid. Both ovaries are normal in size with the left ovary seen best transabdominally. No adnexal mass. No free fluid in the posterior cul-de-sac.  U/A: Yellow clear, protein 2+, blood trace intact, nitrites negative, white blood cells 0-5, red blood cells 0-2, bacteria few.  Urine  culture pending.   Assessment/Plan:  49 y.o. G2P1011   1. Fibroid Large symptomatic fibroids with progressive menorrhagia.  Per pelvic ultrasound today the fibroid has mildly to moderately increased in size in the last year and a half.  Hormonal/medical control of patient's menorrhagia discussed with patient.  Suggested the progestin only pill or the progesterone IUD.  Patient declines, requesting a surgical management.  Myomectomy, supracervical hysterectomy and total hysterectomy discussed with patient.  After reviewing all management alternatives, patient opts for supracervical hysterectomy.  We will proceed with XI robotic supracervical hysterectomy with bilateral salpingectomy.  We will schedule surgery and patient will follow-up for a preop visit.  2. Menorrhagia with regular cycle As above.  3. Urinary frequency Urine analysis mildly perturbed.  Will wait on urine culture to decide if treatment is indicated.  2+ proteins, will repeat a urine analysis at the preop visit to reassess for proteins. - Urinalysis,Complete w/RFL Culture  Other orders - Urine Culture - REFLEXIVE URINE CULTURE  [name] MD, 10:34 AM 03/14/2020

## 2020-03-16 ENCOUNTER — Telehealth: Payer: Self-pay

## 2020-03-16 NOTE — Telephone Encounter (Signed)
I called patient and scheduled surgery for 05/08/20 at 8:30am at [facility].  She has two insurances so no GGA surgery prepymt due.  [facility] pamphlet mailed with dates.  I advised her about need for Covid test prior to surgery and about quarantine protocol after the test. Appt was scheduled accordingly.  Handout and map mailed to her.

## 2020-03-17 LAB — URINALYSIS, COMPLETE W/RFL CULTURE
Bilirubin Urine: NEGATIVE
Glucose, UA: NEGATIVE
Hyaline Cast: NONE SEEN /LPF
Ketones, ur: NEGATIVE
Leukocyte Esterase: NEGATIVE
Nitrites, Initial: NEGATIVE
Specific Gravity, Urine: 1.012 (ref 1.001–1.03)
pH: 7 (ref 5.0–8.0)

## 2020-03-17 LAB — URINE CULTURE
MICRO NUMBER:: 10976261
SPECIMEN QUALITY:: ADEQUATE

## 2020-03-17 LAB — CULTURE INDICATED

## 2020-03-17 MED ORDER — CIPROFLOXACIN HCL 500 MG PO TABS: 500.0000 mg | ORAL_TABLET | Freq: Two times a day (BID) | ORAL | 0 refills | Status: AC

## 2020-03-17 NOTE — Addendum Note (Signed)
Addended by: [name] on: 03/17/2020 06:51 PM   Modules accepted: Orders

## 2020-04-04 ENCOUNTER — Telehealth: Payer: Self-pay | Admitting: *Deleted

## 2020-04-04 MED ORDER — MEGESTROL ACETATE 40 MG PO TABS
40.0000 mg | ORAL_TABLET | Freq: Two times a day (BID) | ORAL | 0 refills | Status: DC
Start: 2020-04-04 — End: 2020-05-08

## 2020-04-04 NOTE — Telephone Encounter (Signed)
Patient informed with below note, Rx sent.

## 2020-04-04 NOTE — Telephone Encounter (Signed)
Patient is scheduled for surgery on 05/08/20 XI robotic supracervical hysterectomy with bilateral salpingectomy. Called today c/o bleeding x 3 weeks now flow heavy to moderate, but daily, has used megace 40 mg in past and has helped. Patient asked if another Rx could be sent to pharmacy? Please advise

## 2020-04-04 NOTE — Telephone Encounter (Signed)
Agree to restart on Megace until surgery:  Megace 40 mg/tab 1 tab PO BID until 05/08/2020.

## 2020-04-25 ENCOUNTER — Telehealth: Payer: Self-pay

## 2020-04-25 NOTE — Patient Instructions (Addendum)
YOU ARE SCHEDULED FOR A COVID TEST _11/11________@___3 :15_________. THIS TEST MUST BE DONE BEFORE SURGERY. GO TO  [place]. [place], [place], IT IS APPROXIMATELY 2 MINUTES PAST [employer] ON THE RIGHT AND REMAIN IN YOUR CAR, THIS IS A DRIVE UP TEST. ONCE YOUR COVID TEST IS DONE PLEASE FOLLOW ALL THE QUARANTINE  INSTRUCTIONS GIVEN IN YOUR HANDOUT.      Your procedure is scheduled on   Report to [place]. M.   Call this number if you have problems the morning of surgery  :[phone].   OUR ADDRESS IS [place].  WE ARE LOCATED IN [facility].  PLEASE BRING YOUR INSURANCE CARD AND PHOTO ID DAY OF SURGERY.  ONLY ONE PERSON ALLOWED IN FACILITY WAITING AREA.                                     REMEMBER:  DO NOT EAT FOOD AFTER MIDNIGHT .   You may have clear liquid until 5:30 AM   YOU MAY  BRUSH YOUR TEETH MORNING OF SURGERY AND RINSE YOUR MOUTH OUT, NO CHEWING GUM CANDY OR MINTS.   TAKE THESE MEDICATIONS MORNING OF SURGERY WITH A SIP OF WATER:  Megace, Metoprolol  ONE VISITOR IS ALLOWED IN WAITING ROOM ONLY DAY OF SURGERY.    NO VISITOR MAY SPEND THE NIGHT.  VISITOR ARE ALLOWED TO STAY UNTIL 800 PM.                                    DO NOT WEAR JEWERLY, MAKE UP, OR NAIL POLISH ON FINGERNAILS.  DO NOT WEAR LOTIONS, POWDERS, PERFUMES OR DEODORANT.  DO NOT SHAVE FOR 24 HOURS PRIOR TO DAY OF SURGERY.  CONTACTS, GLASSES, OR DENTURES MAY NOT BE WORN TO SURGERY.                                    [facility] IS NOT RESPONSIBLE  FOR ANY BELONGINGS.                                                                    Marland Kitchen                                                                                                    [facility] - Preparing for Surgery Before surgery, you can play an important role.   Because skin is not sterile, your skin needs to be as free of germs as possible.   You can reduce the number of  germs on your skin  by washing with CHG (chlorahexidine gluconate) soap before surgery.   CHG is an antiseptic cleaner which kills germs and bonds with the skin to continue killing germs even after washing. Please DO NOT use if you have an allergy to CHG or antibacterial soaps.   If your skin becomes reddened/irritated stop using the CHG and inform your nurse when you arrive at Short Stay. Do not shave (including legs and underarms) for at least 48 hours prior to the first CHG shower.   . Please follow these instructions carefully:  1.  Shower with CHG Soap the night before surgery and the  morning of Surgery.  2.  If you choose to wash your hair, wash your hair first as usual with your  normal  shampoo.  3.  After you shampoo, rinse your hair and body thoroughly to remove the  shampoo.                                        4.  Use CHG as you would any other liquid soap.  You can apply chg directly  to the skin and wash                       Gently with a scrungie or clean washcloth.  5.  Apply the CHG Soap to your body ONLY FROM THE NECK DOWN.   Do not use on face/ open                           Wound or open sores. Avoid contact with eyes, ears mouth and genitals (private parts).                       Wash face,  Genitals (private parts) with your normal soap.             6.  Wash thoroughly, paying special attention to the area where your surgery  will be performed.  7.  Thoroughly rinse your body with warm water from the neck down.  8.  DO NOT shower/wash with your normal soap after using and rinsing off  the CHG Soap.             9.  Pat yourself dry with a clean towel.            10.  Wear clean pajamas.            11.  Place clean sheets on your bed the night of your first shower and do not  sleep with pets. Day of Surgery : Do not apply any lotions/deodorants the morning of surgery.  Please wear clean clothes to the hospital/surgery center.  FAILURE TO FOLLOW THESE INSTRUCTIONS MAY  RESULT IN THE CANCELLATION OF YOUR SURGERY PATIENT SIGNATURE_________________________________  NURSE SIGNATURE__________________________________  ________________________________________________________________________   Jaclyn Fields  An incentive spirometer is a tool that can help keep your lungs clear and active. This tool measures how well you are filling your lungs with each breath. Taking long deep breaths may help reverse or decrease the chance of developing breathing (pulmonary) problems (especially infection) following:  A long period of time when you are unable to move or be active. BEFORE THE PROCEDURE   If the spirometer includes an indicator to show your best effort, your nurse or respiratory therapist will set it  to a desired goal.  If possible, sit up straight or lean slightly forward. Try not to slouch.  Hold the incentive spirometer in an upright position. INSTRUCTIONS FOR USE  1. Sit on the edge of your bed if possible, or sit up as far as you can in bed or on a chair. 2. Hold the incentive spirometer in an upright position. 3. Breathe out normally. 4. Place the mouthpiece in your mouth and seal your lips tightly around it. 5. Breathe in slowly and as deeply as possible, raising the piston or the ball toward the top of the column. 6. Hold your breath for 3-5 seconds or for as long as possible. Allow the piston or ball to fall to the bottom of the column. 7. Remove the mouthpiece from your mouth and breathe out normally. 8. Rest for a few seconds and repeat Steps 1 through 7 at least 10 times every 1-2 hours when you are awake. Take your time and take a few normal breaths between deep breaths. 9. The spirometer may include an indicator to show your best effort. Use the indicator as a goal to work toward during each repetition. 10. After each set of 10 deep breaths, practice coughing to be sure your lungs are clear. If you have an incision (the cut made at the  time of surgery), support your incision when coughing by placing a pillow or rolled up towels firmly against it. Once you are able to get out of bed, walk around indoors and cough well. You may stop using the incentive spirometer when instructed by your caregiver.  RISKS AND COMPLICATIONS  Take your time so you do not get dizzy or light-headed.  If you are in pain, you may need to take or ask for pain medication before doing incentive spirometry. It is harder to take a deep breath if you are having pain. AFTER USE  Rest and breathe slowly and easily.  It can be helpful to keep track of a log of your progress. Your caregiver can provide you with a simple table to help with this. If you are using the spirometer at home, follow these instructions: [place] IF:   You are having difficultly using the spirometer.  You have trouble using the spirometer as often as instructed.  Your pain medication is not giving enough relief while using the spirometer.  You develop fever of 100.5 F (38.1 C) or higher. SEEK IMMEDIATE MEDICAL CARE IF:   You cough up bloody sputum that had not been present before.  You develop fever of 102 F (38.9 C) or greater.  You develop worsening pain at or near the incision site. MAKE SURE YOU:   Understand these instructions.  Will watch your condition.  Will get help right away if you are not doing well or get worse. Document Released: 10/21/2006 Document Revised: 09/02/2011 Document Reviewed: 12/22/2006 [facility] Patient Information 2014 [place], [place].   ________________________________________________________________________

## 2020-04-25 NOTE — Telephone Encounter (Signed)
Ok, as discussed, if patient is experiencing more bleeding with cramping, she can take Megace 40 mg/tab 2 tab PO in am and 1 tab PO HS.  If bleeding still not controled, can take 2 tab PO BID (4 tab total per day).

## 2020-04-25 NOTE — Telephone Encounter (Addendum)
Patient said bleeding was extremely heavy over the weekend and she passed some very large clots (almost size of hand) x 6.  She came home from work today because of bleeding and cramping. Per our conversation she is going to take 2 Megace tonight and then 2 in the morning and one hs from now on. She also will take 600 mg of Ibuprofen once tonight to settle cramping since Tylenol not helping.  She will call me tomorrow morning to follow up.  Note to self:  She will call me if she needs more and directions will be take one tab po every am and two tabs po in the evening per Dr. Marguerita Merles verbal discussion."

## 2020-04-25 NOTE — Telephone Encounter (Signed)
Patient is scheduled for Robotic Assisted Supracerv Hysterectomy next week.  She has been taking Megace 40 mg bid and initially it stopped her bleeding but now she is bleeding/cramping again.  She said she "does note feel good" and wondered if something else you could prescribe for her.

## 2020-04-25 NOTE — Telephone Encounter (Signed)
She is tolerating the Megace just fine. Her concern was she has started to bleed again and cramping pretty bad. I think she stated "not feeling good" because of all the bleeding.  Can she increase Megace anymore than bid or should she come for Depo-Provera?

## 2020-04-25 NOTE — Telephone Encounter (Signed)
She can come in for a DepoProvera shot if she cannot tolerate Megace anymore.  She can also lower the Megace to 1 tab daily (with the risk of bleeding more).

## 2020-04-26 NOTE — Telephone Encounter (Signed)
I called patient to check on her. She said she is feeling much better today. Bleeding has slowed down and is light. I advised her Dr. Marguerita Merles said she could take up to two tabs bid if needed.  She is going to take 2 tabs bid today to hopefully stop the bleeding and tomorrow will drop back to 2 tabs am and one pm if bleeding stops.  She has pre op appt with [facility] tomorrow and will check labs at that time.

## 2020-04-27 ENCOUNTER — Other Ambulatory Visit: Payer: Self-pay

## 2020-04-27 ENCOUNTER — Encounter (HOSPITAL_COMMUNITY): Payer: Self-pay

## 2020-04-27 ENCOUNTER — Encounter (HOSPITAL_COMMUNITY)
Admission: RE | Admit: 2020-04-27 | Discharge: 2020-04-27 | Disposition: A | Discharge status: Home / Self Care | Payer: BC Managed Care – PPO | Source: Ambulatory Visit | Attending: Obstetrics & Gynecology | Admitting: Obstetrics & Gynecology

## 2020-04-27 DIAGNOSIS — I10 Essential (primary) hypertension: Secondary | ICD-10-CM | POA: Insufficient documentation

## 2020-04-27 DIAGNOSIS — Z01818 Encounter for other preprocedural examination: Secondary | ICD-10-CM | POA: Insufficient documentation

## 2020-04-27 HISTORY — DX: Family history of other specified conditions: Z84.89

## 2020-04-27 LAB — BASIC METABOLIC PANEL
Anion gap: 11 (ref 5–15)
BUN: 20 mg/dL (ref 6–20)
CO2: 25 mmol/L (ref 22–32)
Calcium: 8.9 mg/dL (ref 8.9–10.3)
Chloride: 105 mmol/L (ref 98–111)
Creatinine, Ser: 0.85 mg/dL (ref 0.44–1.00)
GFR, Estimated: >60 mL/min (ref >60)
Glucose, Bld: 114 mg/dL — ABNORMAL HIGH (ref 70–99)
Potassium: 3.6 mmol/L (ref 3.5–5.1)
Sodium: 141 mmol/L (ref 135–145)

## 2020-04-27 LAB — TYPE AND SCREEN
ABO/RH(D): O POS
Antibody Screen: NEGATIVE

## 2020-04-27 LAB — CBC
HCT: 33.4 % — ABNORMAL LOW (ref 36.0–46.0)
Hemoglobin: 10.9 g/dL — ABNORMAL LOW (ref 12.0–15.0)
MCH: 30.7 pg (ref 26.0–34.0)
MCHC: 32.6 g/dL (ref 30.0–36.0)
MCV: 94.1 fL (ref 80.0–100.0)
Platelets: 409 10*3/uL — ABNORMAL HIGH (ref 150–400)
RBC: 3.55 MIL/uL — ABNORMAL LOW (ref 3.87–5.11)
RDW: 12.2 % (ref 11.5–15.5)
WBC: 12.4 10*3/uL — ABNORMAL HIGH (ref 4.0–10.5)
nRBC: 0 % (ref 0.0–0.2)

## 2020-04-27 NOTE — Progress Notes (Signed)
COVID Vaccine Completed:Yes Date COVID Vaccine completed:08/13/19- Booster 03/23/20 COVID vaccine manufacturer: Pfizer     PCP - Dr. Coralie Common Cardiologist - none  Chest x-ray - no EKG - 11/30/19-Epic Stress Test - no ECHO - no Cardiac Cath - no Pacemaker/ICD device last checked:NA  Sleep Study - yes CPAP - no  Fasting Blood Sugar - NA Checks Blood Sugar _____ times a day  Blood Thinner Instructions:NA Aspirin Instructions: Last Dose:  Anesthesia review:   Patient denies shortness of breath, fever, cough and chest pain at PAT appointment yes   Patient verbalized understanding of instructions that were given to them at the PAT appointment. Patient was also instructed that they will need to review over the PAT instructions again at home before surgery. Yes Pt  Is able to climb stairs, do housework and ADLs without any SOB

## 2020-04-28 ENCOUNTER — Other Ambulatory Visit: Payer: Self-pay

## 2020-04-28 ENCOUNTER — Telehealth: Payer: Self-pay

## 2020-04-28 MED ORDER — NORGESTIMATE-ETH ESTRADIOL 0.25-35 MG-MCG PO TABS: ORAL_TABLET | ORAL | 0 refills | Status: DC

## 2020-04-28 NOTE — Telephone Encounter (Signed)
Patient called back and asked if okay if she takes an iron supplement until surgery. Advised that was fine however, probably won't have time to have much impact on hgb before surgery but will not hurt anything.

## 2020-04-28 NOTE — Telephone Encounter (Addendum)
Hysterectomy is scheduled for 05/08/20.  She called several days ago and was advised Megace 2 tabs bid and she has done that x 48 hours. Bleeding had slowed down initially after beginning on Megace until last night when it became heavy. Passing blood clots. Constant flow. Had to sit on a towel. Towels in the bed. Cramping and back pain were terrible. Took 2 Megace last night before bed. This morning it is same. Constant flow. Took Ibuprofen this morning. Home from work today.

## 2020-04-28 NOTE — Telephone Encounter (Signed)
Bring in for CBC and Gyn exam with me now.  Try to advance surgery to next week.

## 2020-04-28 NOTE — Telephone Encounter (Signed)
I spoke with Dr. Marguerita Merles because patient had pre op labs/CBC checked yesterday. Dr. Marguerita Merles reviewed labs. She reviewed patient's record and did not see any contraindication to prescribing estrogen for patient.  She prescribed Sprintec to take two tabs po today and then one daily until surgery. I called patient and reviewed this with her. Per Dr. Marguerita Merles she can continue the Megace but if Sprintec slows bleeding she can adjust the Megace and drop back to one bid.  If bleeding not better by Monday Dr. Marguerita Merles will see her at 12:30pm on Monday and patient knows to call me Monday morning if heavy bleeding persists.  Of note, with Covid testing protocol the soonest we could schedule surgery is on Tuesday but our MDs are in the robot room all morning with a case and rest of the day it is scheduled. The robot room is scheduled on Weds and Thurs. Friday [facility] is open but our MD's are in surgery at [facility] in the brief window of time that it is open.

## 2020-05-01 ENCOUNTER — Telehealth: Payer: Self-pay

## 2020-05-01 NOTE — Telephone Encounter (Signed)
Patient called to let me know that she is still bleeding but it has lightened since starting the Provera and she no longer considers it to be heavy.  She passed a small clot this morning but other than that no more clots.  She wants to observe for now and continue the Provera as prescribed.  Her surgery is scheduled a week from today.

## 2020-05-01 NOTE — Telephone Encounter (Signed)
Actually created an addendum and did not need this encounter.

## 2020-05-04 ENCOUNTER — Other Ambulatory Visit (HOSPITAL_COMMUNITY)
Admission: RE | Admit: 2020-05-04 | Discharge: 2020-05-04 | Disposition: A | Discharge status: Home / Self Care | Payer: BC Managed Care – PPO | Source: Ambulatory Visit | Attending: Obstetrics & Gynecology | Admitting: Obstetrics & Gynecology

## 2020-05-04 DIAGNOSIS — Z01812 Encounter for preprocedural laboratory examination: Secondary | ICD-10-CM | POA: Diagnosis not present

## 2020-05-04 DIAGNOSIS — Z20822 Contact with and (suspected) exposure to covid-19: Secondary | ICD-10-CM | POA: Insufficient documentation

## 2020-05-05 ENCOUNTER — Ambulatory Visit (INDEPENDENT_AMBULATORY_CARE_PROVIDER_SITE_OTHER): Payer: BC Managed Care – PPO | Admitting: Obstetrics & Gynecology

## 2020-05-05 ENCOUNTER — Other Ambulatory Visit: Payer: Self-pay

## 2020-05-05 ENCOUNTER — Encounter: Payer: Self-pay | Admitting: Obstetrics & Gynecology

## 2020-05-05 VITALS — BP 136/84 | Ht 64.0 in | Wt 221.0 lb

## 2020-05-05 DIAGNOSIS — Z01419 Encounter for gynecological examination (general) (routine) without abnormal findings: Secondary | ICD-10-CM

## 2020-05-05 DIAGNOSIS — N750 Cyst of Bartholin's gland: Secondary | ICD-10-CM | POA: Diagnosis not present

## 2020-05-05 DIAGNOSIS — D219 Benign neoplasm of connective and other soft tissue, unspecified: Secondary | ICD-10-CM | POA: Diagnosis not present

## 2020-05-05 DIAGNOSIS — Z9851 Tubal ligation status: Secondary | ICD-10-CM | POA: Diagnosis not present

## 2020-05-05 DIAGNOSIS — N921 Excessive and frequent menstruation with irregular cycle: Secondary | ICD-10-CM | POA: Diagnosis not present

## 2020-05-05 LAB — SARS CORONAVIRUS 2 (TAT 6-24 HRS): SARS Coronavirus 2: NEGATIVE

## 2020-05-05 NOTE — Progress Notes (Signed)
Jaclyn Fields [birth year] [id number]   History:    49 y.o. G2P1A1L1 Married.  S/P TL.  Daughter is 56 yo Dancing/great reader.  RP:  Established patient presenting for annual gyn exam and Preop XI Supracervical Hysterectomy/Bilateral Salpingectomy 05/08/20  HPI:  Severe menometrorrhagia with secondary anemia, refractory to medical treatment.  Currently on Megace 40 mg BID.  Mild to moderate persistent bleeding.  Pelvic cramping.  Abstinent recently.  Urine/BMs normal.  Breasts normal.  BMI 37.93.  Not regularly physically active. Health labs with Fam MD.   Past medical history,surgical history, family history and social history were all reviewed and documented in the EPIC chart.  Gynecologic History Patient's last menstrual period was 04/27/2020.  Obstetric History OB History  Gravida Para Term Preterm AB Living  2 1 1   1 1   SAB TAB Ectopic Multiple Live Births    1     1    # Outcome Date GA Lbr Len/2nd Weight Sex Delivery Anes PTL Lv  2 Term 08/23/11 [redacted]w[redacted]d 6 lb 10.9 oz (3.03 kg) F CS-LTranv Spinal  LIV  1 TAB              ROS: A ROS was performed and pertinent positives and negatives are included in the history.  GENERAL: No fevers or chills. HEENT: No change in vision, no earache, sore throat or sinus congestion. NECK: No pain or stiffness. CARDIOVASCULAR: No chest pain or pressure. No palpitations. PULMONARY: No shortness of breath, cough or wheeze. GASTROINTESTINAL: No abdominal pain, nausea, vomiting or diarrhea, melena or bright red blood per rectum. GENITOURINARY: No urinary frequency, urgency, hesitancy or dysuria. MUSCULOSKELETAL: No joint or muscle pain, no back pain, no recent trauma. DERMATOLOGIC: No rash, no itching, no lesions. ENDOCRINE: No polyuria, polydipsia, no heat or cold intolerance. No recent change in weight. HEMATOLOGICAL: No anemia or easy bruising or bleeding. NEUROLOGIC: No headache, seizures, numbness, tingling or weakness. PSYCHIATRIC: No  depression, no loss of interest in normal activity or change in sleep pattern.     Exam:   BP 136/84   Ht 5' 4"  (1.626 m)   Wt 221 lb (100.2 kg)   LMP 04/27/2020   BMI 37.93 kg/m   Body mass index is 37.93 kg/m.  General appearance : Well developed well nourished female. No acute distress HEENT: Eyes: no retinal hemorrhage or exudates,  Neck supple, trachea midline, no carotid bruits, no thyroidmegaly Lungs: Clear to auscultation, no rhonchi or wheezes, or rib retractions  Heart: Regular rate and rhythm, no murmurs or gallops Breast:Examined in sitting and supine position were symmetrical in appearance, no palpable masses or tenderness,  no skin retraction, no nipple inversion, no nipple discharge, no skin discoloration, no axillary or supraclavicular lymphadenopathy Abdomen: no palpable masses or tenderness, no rebound or guarding Extremities: no edema or skin discoloration or tenderness  Pelvic: Vulva:  Left large non-tender Bartholin gland cyst.             Vagina: No gross lesions or discharge  Cervix: No gross lesions or discharge.  Pap reflex done.  Uterus  Enlarged with Fibroids, 10-12 cm, non-tender and mobile  Adnexa  Without masses or tenderness  Anus: Normal   Assessment/Plan:  49y.o. female for annual exam   1. Encounter for routine gynecological examination with Papanicolaou smear of cervix Gynecologic exam with uterine fibroids and left Bartholin gland cyst.  Pap reflex done.  Breast exam normal.  Needs to schedule screening mammogram.  We will  follow-up for fasting health labs here. - CBC; Future - Comp Met (CMET); Future - Lipid panel; Future - TSH; Future - VITAMIN D 25 Hydroxy (Vit-D Deficiency, Fractures); Future  2. S/P tubal ligation  3. Fibroid Severe refractory menometrorrhagia on Megace currently.  Decision to proceed with XI Robotic Supracervical Hysterectomy with Bilateral Salpingectomy.  Surgery and risks thoroughly reviewed with patient.   Patient voiced understanding and agreement with plan.  Preop preparation and postop precautions and expectations reviewed as well.  4. Menorrhagia with irregular cycle As above.  5. Cyst of left Bartholin's gland We will proceed with a left Bartholin gland Marsupialization at the time of the hysterectomy.  [name] MD, 2:46 PM 05/05/2020

## 2020-05-07 ENCOUNTER — Encounter: Payer: Self-pay | Admitting: Obstetrics & Gynecology

## 2020-05-07 NOTE — Anesthesia Preprocedure Evaluation (Addendum)
Anesthesia Evaluation  Patient identified by MRN, date of birth, ID band Patient awake    Reviewed: Allergy & Precautions, NPO status , Patient's Chart, lab work & pertinent test results  History of Anesthesia Complications Negative for: history of anesthetic complications  Airway Mallampati: III  TM Distance: >3 FB Neck ROM: Full    Dental  (+) Dental Advisory Given, Teeth Intact   Pulmonary neg pulmonary ROS,    Pulmonary exam normal        Cardiovascular hypertension, Pt. on home beta blockers and Pt. on medications Normal cardiovascular exam     Neuro/Psych negative neurological ROS  negative psych ROS   GI/Hepatic negative GI ROS, Neg liver ROS,   Endo/Other   Obesity   Renal/GU negative Renal ROS     Musculoskeletal negative musculoskeletal ROS (+)   Abdominal (+) + obese,   Peds  Hematology  (+) anemia ,   Anesthesia Other Findings Covid test negative   Reproductive/Obstetrics                           Anesthesia Physical Anesthesia Plan  ASA: II  Anesthesia Plan: General   Post-op Pain Management:    Induction: Intravenous  PONV Risk Score and Plan: 4 or greater and Treatment may vary due to age or medical condition, Ondansetron, Midazolam, Scopolamine patch - Pre-op and Dexamethasone  Airway Management Planned: Oral ETT  Additional Equipment: None  Intra-op Plan:   Post-operative Plan: Extubation in OR  Informed Consent: I have reviewed the patients History and Physical, chart, labs and discussed the procedure including the risks, benefits and alternatives for the proposed anesthesia with the patient or authorized representative who has indicated his/her understanding and acceptance.     Dental advisory given  Plan Discussed with: CRNA and Anesthesiologist  Anesthesia Plan Comments:        Anesthesia Quick Evaluation

## 2020-05-08 ENCOUNTER — Encounter (HOSPITAL_BASED_OUTPATIENT_CLINIC_OR_DEPARTMENT_OTHER)
Admission: RE | Disposition: A | Discharge status: Home / Self Care | Payer: Self-pay | Source: Home / Self Care | Attending: Obstetrics & Gynecology

## 2020-05-08 ENCOUNTER — Ambulatory Visit (HOSPITAL_BASED_OUTPATIENT_CLINIC_OR_DEPARTMENT_OTHER): Payer: BC Managed Care – PPO | Admitting: Anesthesiology

## 2020-05-08 ENCOUNTER — Ambulatory Visit (HOSPITAL_BASED_OUTPATIENT_CLINIC_OR_DEPARTMENT_OTHER)
Admission: RE | Admit: 2020-05-08 | Discharge: 2020-05-08 | Disposition: A | Discharge status: Home / Self Care | Payer: BC Managed Care – PPO | Attending: Obstetrics & Gynecology | Admitting: Obstetrics & Gynecology

## 2020-05-08 ENCOUNTER — Other Ambulatory Visit: Payer: Self-pay

## 2020-05-08 ENCOUNTER — Encounter (HOSPITAL_BASED_OUTPATIENT_CLINIC_OR_DEPARTMENT_OTHER): Payer: Self-pay | Admitting: Obstetrics & Gynecology

## 2020-05-08 DIAGNOSIS — N750 Cyst of Bartholin's gland: Secondary | ICD-10-CM | POA: Diagnosis not present

## 2020-05-08 DIAGNOSIS — D259 Leiomyoma of uterus, unspecified: Secondary | ICD-10-CM | POA: Diagnosis not present

## 2020-05-08 DIAGNOSIS — Z9889 Other specified postprocedural states: Secondary | ICD-10-CM

## 2020-05-08 DIAGNOSIS — N800 Endometriosis of uterus: Secondary | ICD-10-CM | POA: Insufficient documentation

## 2020-05-08 DIAGNOSIS — N921 Excessive and frequent menstruation with irregular cycle: Secondary | ICD-10-CM | POA: Insufficient documentation

## 2020-05-08 DIAGNOSIS — D500 Iron deficiency anemia secondary to blood loss (chronic): Secondary | ICD-10-CM | POA: Diagnosis not present

## 2020-05-08 DIAGNOSIS — Z79899 Other long term (current) drug therapy: Secondary | ICD-10-CM | POA: Insufficient documentation

## 2020-05-08 DIAGNOSIS — D251 Intramural leiomyoma of uterus: Secondary | ICD-10-CM | POA: Diagnosis not present

## 2020-05-08 DIAGNOSIS — I10 Essential (primary) hypertension: Secondary | ICD-10-CM | POA: Diagnosis not present

## 2020-05-08 DIAGNOSIS — N736 Female pelvic peritoneal adhesions (postinfective): Secondary | ICD-10-CM | POA: Insufficient documentation

## 2020-05-08 DIAGNOSIS — Z01419 Encounter for gynecological examination (general) (routine) without abnormal findings: Secondary | ICD-10-CM | POA: Diagnosis not present

## 2020-05-08 HISTORY — PX: BARTHOLIN CYST MARSUPIALIZATION: SHX5383

## 2020-05-08 HISTORY — PX: ROBOTIC ASSISTED SUPRACERVICAL HYSTERECTOMY WITH BILATERAL SALPINGO OOPHERECTOMY: SHX6084

## 2020-05-08 LAB — CBC
HCT: 32.5 % — ABNORMAL LOW (ref 36.0–46.0)
Hemoglobin: 10.4 g/dL — ABNORMAL LOW (ref 12.0–15.0)
MCH: 29.8 pg (ref 26.0–34.0)
MCHC: 32 g/dL (ref 30.0–36.0)
MCV: 93.1 fL (ref 80.0–100.0)
Platelets: 422 K/uL — ABNORMAL HIGH (ref 150–400)
RBC: 3.49 MIL/uL — ABNORMAL LOW (ref 3.87–5.11)
RDW: 12.4 % (ref 11.5–15.5)
WBC: 20.2 K/uL — ABNORMAL HIGH (ref 4.0–10.5)
nRBC: 0 % (ref 0.0–0.2)

## 2020-05-08 LAB — POCT PREGNANCY, URINE: Preg Test, Ur: NEGATIVE

## 2020-05-08 SURGERY — HYSTERECTOMY, SUPRACERVICAL, ROBOT-ASSISTED, LAPAROSCOPIC, WITH BILATERAL SALPINGECTOMY
Anesthesia: General | Site: Abdomen | Laterality: Left

## 2020-05-08 MED ORDER — LIDOCAINE HCL 2 % IJ SOLN
INTRAMUSCULAR | Status: AC
  Filled 2020-05-08: qty 40

## 2020-05-08 MED ORDER — HYDROCODONE-ACETAMINOPHEN 5-325 MG PO TABS
ORAL_TABLET | ORAL | Status: AC
  Filled 2020-05-08: qty 1

## 2020-05-08 MED ORDER — HYDROCODONE-ACETAMINOPHEN 5-325 MG PO TABS
1.0000 | ORAL_TABLET | ORAL | Status: DC | PRN
  Administered 2020-05-08 (×2): 1 via ORAL

## 2020-05-08 MED ORDER — FENTANYL CITRATE (PF) 100 MCG/2ML IJ SOLN
INTRAMUSCULAR | Status: AC
  Filled 2020-05-08: qty 2

## 2020-05-08 MED ORDER — OXYCODONE-ACETAMINOPHEN 5-325 MG PO TABS: 2.0000 | ORAL_TABLET | ORAL | Status: DC | PRN

## 2020-05-08 MED ORDER — PROMETHAZINE HCL 25 MG/ML IJ SOLN: 6.2500 mg | INTRAMUSCULAR | Status: DC | PRN

## 2020-05-08 MED ORDER — BUPIVACAINE HCL (PF) 0.25 % IJ SOLN
INTRAMUSCULAR | Status: DC | PRN
  Administered 2020-05-08: 16 mL

## 2020-05-08 MED ORDER — KETAMINE HCL 10 MG/ML IJ SOLN
INTRAMUSCULAR | Status: AC
  Filled 2020-05-08: qty 1

## 2020-05-08 MED ORDER — PROPOFOL 10 MG/ML IV BOLUS
INTRAVENOUS | Status: AC
  Filled 2020-05-08: qty 20

## 2020-05-08 MED ORDER — CEFAZOLIN SODIUM-DEXTROSE 2-4 GM/100ML-% IV SOLN
INTRAVENOUS | Status: AC
  Filled 2020-05-08: qty 100

## 2020-05-08 MED ORDER — ROCURONIUM BROMIDE 10 MG/ML (PF) SYRINGE
PREFILLED_SYRINGE | INTRAVENOUS | Status: DC | PRN
  Administered 2020-05-08: 60 mg via INTRAVENOUS
  Administered 2020-05-08: 10 mg via INTRAVENOUS

## 2020-05-08 MED ORDER — ACETAMINOPHEN 325 MG PO TABS: 650.0000 mg | ORAL_TABLET | ORAL | Status: DC | PRN

## 2020-05-08 MED ORDER — OXYCODONE HCL 5 MG/5ML PO SOLN: 5.0000 mg | Freq: Once | ORAL | Status: DC | PRN

## 2020-05-08 MED ORDER — KETOROLAC TROMETHAMINE 30 MG/ML IJ SOLN
INTRAMUSCULAR | Status: DC | PRN
  Administered 2020-05-08: 30 mg via INTRAVENOUS

## 2020-05-08 MED ORDER — CEFAZOLIN SODIUM-DEXTROSE 2-4 GM/100ML-% IV SOLN
2.0000 g | INTRAVENOUS | Status: AC
  Administered 2020-05-08: 2 g via INTRAVENOUS

## 2020-05-08 MED ORDER — DEXAMETHASONE SODIUM PHOSPHATE 10 MG/ML IJ SOLN
INTRAMUSCULAR | Status: DC | PRN
  Administered 2020-05-08: 10 mg via INTRAVENOUS

## 2020-05-08 MED ORDER — MIDAZOLAM HCL 5 MG/5ML IJ SOLN
INTRAMUSCULAR | Status: DC | PRN
  Administered 2020-05-08: 2 mg via INTRAVENOUS

## 2020-05-08 MED ORDER — LACTATED RINGERS IV SOLN: INTRAVENOUS | Status: DC

## 2020-05-08 MED ORDER — ROCURONIUM BROMIDE 10 MG/ML (PF) SYRINGE
PREFILLED_SYRINGE | INTRAVENOUS | Status: AC
  Filled 2020-05-08: qty 10

## 2020-05-08 MED ORDER — OXYCODONE-ACETAMINOPHEN 7.5-325 MG PO TABS
1.0000 | ORAL_TABLET | Freq: Four times a day (QID) | ORAL | 0 refills | Status: DC | PRN
Start: 2020-05-08 — End: 2020-07-07

## 2020-05-08 MED ORDER — PROPOFOL 10 MG/ML IV BOLUS
INTRAVENOUS | Status: DC | PRN
  Administered 2020-05-08: 200 mg via INTRAVENOUS

## 2020-05-08 MED ORDER — FENTANYL CITRATE (PF) 100 MCG/2ML IJ SOLN
25.0000 ug | INTRAMUSCULAR | Status: DC | PRN
  Administered 2020-05-08 (×3): 25 ug via INTRAVENOUS

## 2020-05-08 MED ORDER — MIDAZOLAM HCL 2 MG/2ML IJ SOLN
INTRAMUSCULAR | Status: AC
  Filled 2020-05-08: qty 2

## 2020-05-08 MED ORDER — LIDOCAINE HCL (PF) 2 % IJ SOLN
INTRAMUSCULAR | Status: DC | PRN
  Administered 2020-05-08: 1 mg/kg/h via INTRADERMAL

## 2020-05-08 MED ORDER — POVIDONE-IODINE 10 % EX SWAB: 2.0000 "application " | Freq: Once | CUTANEOUS | Status: DC

## 2020-05-08 MED ORDER — LIDOCAINE 2% (20 MG/ML) 5 ML SYRINGE
INTRAMUSCULAR | Status: DC | PRN
  Administered 2020-05-08: 40 mg via INTRAVENOUS
  Administered 2020-05-08: 60 mg via INTRAVENOUS

## 2020-05-08 MED ORDER — EPHEDRINE SULFATE-NACL 50-0.9 MG/10ML-% IV SOSY
PREFILLED_SYRINGE | INTRAVENOUS | Status: DC | PRN
  Administered 2020-05-08: 10 mg via INTRAVENOUS

## 2020-05-08 MED ORDER — ONDANSETRON HCL 4 MG/2ML IJ SOLN
INTRAMUSCULAR | Status: DC | PRN
  Administered 2020-05-08: 4 mg via INTRAVENOUS

## 2020-05-08 MED ORDER — SUGAMMADEX SODIUM 200 MG/2ML IV SOLN
INTRAVENOUS | Status: DC | PRN
  Administered 2020-05-08: 200 mg via INTRAVENOUS

## 2020-05-08 MED ORDER — LABETALOL HCL 5 MG/ML IV SOLN: 10.0000 mg | INTRAVENOUS | Status: DC | PRN

## 2020-05-08 MED ORDER — KETOROLAC TROMETHAMINE 30 MG/ML IJ SOLN
INTRAMUSCULAR | Status: AC
  Filled 2020-05-08: qty 1

## 2020-05-08 MED ORDER — LIDOCAINE 2% (20 MG/ML) 5 ML SYRINGE
INTRAMUSCULAR | Status: AC
  Filled 2020-05-08: qty 5

## 2020-05-08 MED ORDER — OXYCODONE HCL 5 MG PO TABS: 5.0000 mg | ORAL_TABLET | Freq: Once | ORAL | Status: DC | PRN

## 2020-05-08 MED ORDER — ONDANSETRON HCL 4 MG/2ML IJ SOLN
INTRAMUSCULAR | Status: AC
  Filled 2020-05-08: qty 2

## 2020-05-08 MED ORDER — FENTANYL CITRATE (PF) 100 MCG/2ML IJ SOLN
INTRAMUSCULAR | Status: DC | PRN
  Administered 2020-05-08: 100 ug via INTRAVENOUS

## 2020-05-08 MED ORDER — DEXAMETHASONE SODIUM PHOSPHATE 10 MG/ML IJ SOLN
INTRAMUSCULAR | Status: AC
  Filled 2020-05-08: qty 1

## 2020-05-08 MED ORDER — KETAMINE HCL 10 MG/ML IJ SOLN
INTRAMUSCULAR | Status: DC | PRN
  Administered 2020-05-08: 40 mg via INTRAVENOUS

## 2020-05-08 MED ORDER — PHENYLEPHRINE 40 MCG/ML (10ML) SYRINGE FOR IV PUSH (FOR BLOOD PRESSURE SUPPORT)
PREFILLED_SYRINGE | INTRAVENOUS | Status: DC | PRN
  Administered 2020-05-08 (×2): 80 ug via INTRAVENOUS

## 2020-05-08 SURGICAL SUPPLY — 63 items
ADH SKN CLS APL DERMABOND .7 (GAUZE/BANDAGES/DRESSINGS) ×2
BAG LAPAROSCOPIC 12 15 PORT 16 (BASKET) IMPLANT
BAG RETRIEVAL 12/15 (BASKET) ×3
BAG SPEC RTRVL LRG 6X4 10 (ENDOMECHANICALS) ×2
BLADE SURG 10 STRL SS (BLADE) ×1 IMPLANT
BLADE SURG 11 STRL SS (BLADE) ×1 IMPLANT
CATH FOLEY 3WAY  5CC 16FR (CATHETERS) ×3
CATH FOLEY 3WAY 5CC 16FR (CATHETERS) ×2 IMPLANT
COVER BACK TABLE 60X90IN (DRAPES) ×3 IMPLANT
COVER TIP SHEARS 8 DVNC (MISCELLANEOUS) ×2 IMPLANT
COVER TIP SHEARS 8MM DA VINCI (MISCELLANEOUS) ×3
COVER WAND RF STERILE (DRAPES) ×3 IMPLANT
DECANTER SPIKE VIAL GLASS SM (MISCELLANEOUS) ×5 IMPLANT
DEFOGGER SCOPE WARMER CLEARIFY (MISCELLANEOUS) ×3 IMPLANT
DERMABOND ADVANCED (GAUZE/BANDAGES/DRESSINGS) ×1
DERMABOND ADVANCED .7 DNX12 (GAUZE/BANDAGES/DRESSINGS) ×2 IMPLANT
DRAPE ARM DVNC X/XI (DISPOSABLE) ×8 IMPLANT
DRAPE COLUMN DVNC XI (DISPOSABLE) ×2 IMPLANT
DRAPE DA VINCI XI ARM (DISPOSABLE) ×12
DRAPE DA VINCI XI COLUMN (DISPOSABLE) ×3
DRAPE UTILITY XL STRL (DRAPES) ×3 IMPLANT
DRSG ADAPTIC 3X8 NADH LF (GAUZE/BANDAGES/DRESSINGS) ×1 IMPLANT
DURAPREP 26ML APPLICATOR (WOUND CARE) ×3 IMPLANT
ELECT REM PT RETURN 9FT ADLT (ELECTROSURGICAL) ×3
ELECTRODE REM PT RTRN 9FT ADLT (ELECTROSURGICAL) ×2 IMPLANT
GAUZE PETROLATUM 1 X8 (GAUZE/BANDAGES/DRESSINGS) ×3 IMPLANT
GLOVE BIO SURGEON STRL SZ 6.5 (GLOVE) ×9 IMPLANT
GLOVE BIO SURGEON STRL SZ7 (GLOVE) ×2 IMPLANT
GLOVE BIOGEL PI IND STRL 6.5 (GLOVE) IMPLANT
GLOVE BIOGEL PI INDICATOR 6.5 (GLOVE) ×2
GLOVE ECLIPSE 6.5 STRL STRAW (GLOVE) ×2 IMPLANT
Glove 7.0 ×5 IMPLANT
HOLDER FOLEY CATH W/STRAP (MISCELLANEOUS) ×1 IMPLANT
IRRIG SUCT STRYKERFLOW 2 WTIP (MISCELLANEOUS) ×3
IRRIGATION SUCT STRKRFLW 2 WTP (MISCELLANEOUS) ×2 IMPLANT
IV NS IRRIG 3000ML ARTHROMATIC (IV SOLUTION) ×1 IMPLANT
LEGGING LITHOTOMY PAIR STRL (DRAPES) ×3 IMPLANT
MANIFOLD NEPTUNE II (INSTRUMENTS) ×1 IMPLANT
OBTURATOR OPTICAL STANDARD 8MM (TROCAR) ×3
OBTURATOR OPTICAL STND 8 DVNC (TROCAR) ×2
OBTURATOR OPTICALSTD 8 DVNC (TROCAR) ×2 IMPLANT
OCCLUDER COLPOPNEUMO (BALLOONS) ×3 IMPLANT
PACK ROBOT WH (CUSTOM PROCEDURE TRAY) ×3 IMPLANT
PACK ROBOTIC GOWN (GOWN DISPOSABLE) ×3 IMPLANT
PACK TRENDGUARD 450 HYBRID PRO (MISCELLANEOUS) IMPLANT
PAD OB MATERNITY 4.3X12.25 (PERSONAL CARE ITEMS) ×3 IMPLANT
PAD PREP 24X48 CUFFED NSTRL (MISCELLANEOUS) ×3 IMPLANT
POUCH SPECIMEN RETRIEVAL 10MM (ENDOMECHANICALS) ×1 IMPLANT
PROTECTOR NERVE ULNAR (MISCELLANEOUS) ×6 IMPLANT
SEAL CANN UNIV 5-8 DVNC XI (MISCELLANEOUS) ×8 IMPLANT
SEAL XI 5MM-8MM UNIVERSAL (MISCELLANEOUS) ×12
SET TRI-LUMEN FLTR TB AIRSEAL (TUBING) ×3 IMPLANT
SUT VIC AB 2-0 UR6 27 (SUTURE) ×2 IMPLANT
SUT VIC AB 4-0 PS2 27 (SUTURE) ×9 IMPLANT
SUT VICRYL 0 UR6 27IN ABS (SUTURE) ×3 IMPLANT
SUT VLOC 180 0 9IN  GS21 (SUTURE) ×3
SUT VLOC 180 0 9IN GS21 (SUTURE) ×2 IMPLANT
TIP UTERINE 6.7X10CM GRN DISP (MISCELLANEOUS) ×1 IMPLANT
TOWEL OR 17X26 10 PK STRL BLUE (TOWEL DISPOSABLE) ×3 IMPLANT
TRENDGUARD 450 HYBRID PRO PACK (MISCELLANEOUS) ×3
TROCAR BLADELESS OPT 12M 100M (ENDOMECHANICALS) ×1 IMPLANT
TROCAR PORT AIRSEAL 5X120 (TROCAR) ×3 IMPLANT
WATER STERILE IRR 1000ML POUR (IV SOLUTION) ×3 IMPLANT

## 2020-05-08 NOTE — Addendum Note (Signed)
Addended by: Thurnell Garbe A on: 05/08/2020 09:48 AM   Modules accepted: Orders

## 2020-05-08 NOTE — Progress Notes (Signed)
Patient has voided, ambulated x 2 w/o difficulty, tolerated oral diet, and abdominal cramping is 2/10 with oral analgesia. Abdomen is slightly distended but soft and incisions C/D/I. Minimal spotting to peripad. Dr. Dellis Filbert notified, discharge orders obtained.

## 2020-05-08 NOTE — H&P (Addendum)
Jaclyn Fields is an 49 y.o. female.[id number] Married. S/P TL. 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  EP:[id number] Menometrorrhagia with Fibroids for XI Supracervical Hysterectomy/Bilateral Salpingectomy 05/08/20  SAY:[id number] menometrorrhagia with secondary anemia, refractory to medical treatment.  Currently on Megace 40 mg BID.  Mild to moderate persistent bleeding. Pelvic cramping. Abstinent recently. Urine/BMs normal.   Pertinent Gynecological History: Menses: Menometrorrhagia Contraception: tubal ligation Blood transfusions: none Last mammogram: normal  Last pap: normal   Menstrual History:  Patient's last menstrual period was 04/27/2020.    Past Medical History:  Diagnosis Date  . Bartholin's duct cyst    Bartholin duct cyst 2 left last one 2015  . Family history of adverse reaction to anesthesia    N&V  . Hypertension   . Maternal iron deficiency anemia 08/24/2011  . PP care - s/p 1C/S (breech, [facility]) 08/24/2011    Past Surgical History:  Procedure Laterality Date  . AUGMENTATION MAMMAPLASTY Bilateral 2005  . CESAREAN SECTION    . COLONOSCOPY  2018  . DILATION AND EVACUATION  09/05/2011   Procedure: DILATATION AND EVACUATION;  Surgeon: Elveria Royals, MD;  Location: [facility];  Service: Gynecology;  Laterality: Bilateral;  . TUBAL LIGATION    . WISDOM TOOTH EXTRACTION      Family History  Problem Relation Age of Onset  . Hypertension Mother   . Hypertension Father   . Diabetes Father     Social History:  reports that she has never smoked. She has never used smokeless tobacco. She reports current alcohol use. She reports that she does not use drugs.  Allergies:  Allergies  Allergen Reactions  . Sulfa Antibiotics Swelling and Rash    SWELLING TO FINGERS    Medications Prior to Admission  Medication Sig Dispense Refill Last Dose  . Ascorbic Acid (VITAMIN C GUMMIE PO) Take 1 tablet by mouth daily.   Past Month at Unknown time  . Biotin w/  Vitamins C & E (HAIR/SKIN/NAILS PO) Take 2 tablets by mouth daily.   Past Month at Unknown time  . cetirizine (ZYRTEC) 10 MG tablet Take 1 tablet (10 mg total) by mouth daily. (Patient taking differently: Take 10 mg by mouth at bedtime. ) 90 tablet 3 Past Week at Unknown time  . cholecalciferol (VITAMIN D3) 25 MCG (1000 UNIT) tablet Take 1,000 Units by mouth daily.   Past Month at Unknown time  . fluticasone (FLONASE) 50 MCG/ACT nasal spray SPRAY 2 SPRAYS INTO EACH NOSTRIL EVERY DAY (Patient taking differently: Place 2 sprays into both nostrils at bedtime. ) 48 mL 11 Past Month at Unknown time  . hydrochlorothiazide (HYDRODIURIL) 25 MG tablet Take 1 tablet (25 mg total) by mouth daily. 90 tablet 1 05/07/2020 at Unknown time  . ibuprofen (ADVIL) 200 MG tablet Take 400-800 mg by mouth every 6 (six) hours as needed for moderate pain.   Past Week at Unknown time  . ibuprofen (ADVIL) 800 MG tablet Take 1 tablet (800 mg total) by mouth every 8 (eight) hours as needed. 90 tablet 2 Past Week at Unknown time  . megestrol (MEGACE) 40 MG tablet Take 1 tablet (40 mg total) by mouth 2 (two) times daily. 90 tablet 0 05/08/2020 at 0600  . metoprolol succinate (TOPROL-XL) 100 MG 24 hr tablet Take 1 tablet (100 mg total) by mouth daily. Take with or immediately following a meal. 90 tablet 1 05/08/2020 at 0600  . Multiple Vitamins-Minerals (MULTIVITAMIN GUMMIES WOMENS PO) Take 2 tablets by mouth daily.  Past Month at Unknown time  . norgestimate-ethinyl estradiol (ORTHO-CYCLEN) 0.25-35 MG-MCG tablet Take two tabs po stat then one po daily until surgery. 28 tablet 0 05/07/2020 at Unknown time  . Omega-3 Fatty Acids (FISH OIL PO) Take 1 capsule by mouth daily.   Past Month at Unknown time  . bimatoprost (LATISSE) 0.03 % ophthalmic solution Place 1 application into both eyes at bedtime. Place one drop on applicator and apply evenly along the skin of the upper eyelid at base of eyelashes once daily at bedtime; repeat  procedure for second eye (use a clean applicator). 3 mL 12 More than a month at Unknown time    REVIEW OF SYSTEMS: A ROS was performed and pertinent positives and negatives are included in the history.  GENERAL: No fevers or chills. HEENT: No change in vision, no earache, sore throat or sinus congestion. NECK: No pain or stiffness. CARDIOVASCULAR: No chest pain or pressure. No palpitations. PULMONARY: No shortness of breath, cough or wheeze. GASTROINTESTINAL: No abdominal pain, nausea, vomiting or diarrhea, melena or bright red blood per rectum. GENITOURINARY: No urinary frequency, urgency, hesitancy or dysuria. MUSCULOSKELETAL: No joint or muscle pain, no back pain, no recent trauma. DERMATOLOGIC: No rash, no itching, no lesions. ENDOCRINE: No polyuria, polydipsia, no heat or cold intolerance. No recent change in weight. HEMATOLOGICAL: No anemia or easy bruising or bleeding. NEUROLOGIC: No headache, seizures, numbness, tingling or weakness. PSYCHIATRIC: No depression, no loss of interest in normal activity or change in sleep pattern.     Blood pressure (!) 179/94, pulse 81, temperature 98.3 F (36.8 C), temperature source Oral, resp. rate 18, height 5\' 3"  (1.6 m), weight 100.3 kg, last menstrual period 04/27/2020, SpO2 99 %.  Physical Exam:  See office notes   Results for orders placed or performed during the hospital encounter of 05/08/20 (from the past 24 hour(s))  Pregnancy, urine POC     Status: None   Collection Time: 05/08/20  6:57 AM  Result Value Ref Range   Preg Test, Ur NEGATIVE NEGATIVE   Covid Negative Hb 10.9 on 04/27/20  Pelvic US 02/2020: T/V images. Anteverted uterus enlarged with an intramural fibroid measured at 6.8 x 7.1 cm on the left aspect of the uterus. The fibroid is increased in size compared to Sep 18, 2018 when it was measured at 4.2 x 4.4 cm. The overall uterine size is measured at 11.01 x 7.26 x 5.85 cm. The endometrial lining is measured at 7.53 mm with a sliver  of free fluid towards the right endometrium, Doppler flow is seen at the left endometrium without a mass, the endometrium is distorted due to the left fibroid. Both ovaries are normal in size with the left ovary seen best transabdominally. No adnexal mass. No free fluid in the posterior cul-de-sac.  Assessment/Plan:  Fibroids with refractory menometrorrhagia: Severe refractory menometrorrhagia on Megace currently.  Decision to proceed with XI Robotic Supracervical Hysterectomy with Bilateral Salpingectomy.  Surgery and risks thoroughly reviewed with patient.  Patient voiced understanding and agreement with plan.  Preop preparation and postop precautions and expectations reviewed as well.  Cyst of left Bartholin's gland We will proceed with a left Bartholin gland Marsupialization at the time of the hysterectomy.                        Patient was counseled as to the risk of surgery to include the following:  1. Infection (prohylactic antibiotics will be administered)  2. DVT/Pulmonary Embolism (prophylactic  pneumo compression stockings will be used)  3.Trauma to internal organs requiring additional surgical procedure to repair any injury to internal organs requiring perhaps additional hospitalization days.  4.Hemmorhage requiring transfusion and blood products which carry risks such as anaphylactic reaction, hepatitis and AIDS  Patient had received literature information on the procedure scheduled and all her questions were answered and fully accepts all risk.   Marie-Lyne Cassidey Barrales 05/08/2020, 7:36 AM

## 2020-05-08 NOTE — Op Note (Signed)
Operative Note  05/08/2020  11:36 AM  PATIENT:  Jaclyn Fields  49 y.o. female  PRE-OPERATIVE DIAGNOSIS:  Large symptomatic fibroids with menometrorrhagia.  Left Bartholin Cyst  POST-OPERATIVE DIAGNOSIS:  Large symptomatic fibroids with menometrorrhagia.  Left Bartholin Cyst.  Severe omental adhesions  PROCEDURE:  Procedure(s): XI ROBOTIC ASSISTED SUPRACERVICAL HYSTERECTOMY WITH BILATERAL SALPINGECTOMY AND EXTENSIVE LYSIS OF ADHESIONS LEFT BARTHOLIN CYST MARSUPIALIZATION  SURGEON:  Surgeon(s): [name], MD  ANESTHESIA:   general  FINDINGS: Large fibromatous uterus (Wt 339 g) with status post bilateral tubal ligation.  Bilateral ovaries normal.  Severe omental adhesions with the anterior abdominal and pelvic walls.  Large left Bartholin cyst.  DESCRIPTION OF OPERATION: Under general anesthesia with endotracheal intubation the patient is in lithotomy position.  She is prepped with DuraPrep on the abdomen and Betadine on the suprapubic, vulvar and vaginal areas.  She is draped as usual.  Timeout is done.  The vaginal exam reveals an anteverted uterus enlarged to 10-12 cm in diameter, nodular, mobile.  No adnexal mass felt.  The Foley is put in place in the bladder.  The weighted speculum is inserted in the vagina.  The anterior lip of the cervix is grasped with a tenaculum.  The hysterometry is at 10 cm.  A #10 Rumi with the small Koh ring are put in place easily.  The other instruments are removed.  A large left Bartholin gland cyst is present.  We go to the abdomen.  The supraumbilical area is infiltrated with Marcaine one quarter plain.  A 1.5 cm incision is done with the scalpel at that level.  The aponeurosis is grasped with cokers.  The aponeurosis is opened under direct vision with Mayo scissors.  The parietal peritoneum is grasped with hemostats and opened with Mayo scissors under direct vision.  A pursestring stitch of Vicryl 0 is done on the aponeurosis.  The Sheryle Hail is  inserted at that level and a pneumoperitoneum is created with CO2.  Inspection of the abdominal pelvic cavities reveals severe ideations between the omentum and the anterior wall of the abdomen and pelvis.  The abdominal wall is free at the port insertion sites.  The skin is marked for 2 ports on the left side and 2 ports on the right side in line with the umbilicus.  Infiltration of Marcaine one quarter plain at all sites.  Small incisions with the skull pill.  All ports are inserted under direct vision.  3 robotic ports are inserted and an assistant 5 mm port is inserted at the medial left side.  The patient is positioned in 30 degree Trendelenburg.  The robot is docked.  Targeting is done.  Robotic instruments are inserted under direct vision with the fenestrated clamp in the fourth arm, the scissors in the third arm, the camera and the second arm and the bipolar in the first arm.  We go to the console.      We start with extensive lysis of additions between the omentum and the anterior abdominal and pelvic walls.  The uterus is increased in size with fibroids measuring about 10 to 12 cm.  Both tubes are status post tubal ligation.  Both ovaries are normal in size and appearance.  Pictures are taken of the pelvic organs.  Both ureters are seen in normal anatomic position.  We start on the right side with cauterization and section of the right mesosalpinx.  The distal part of the tube is removed from the abdomen and will be sent  with the rest of the specimen to pathology.  We then cauterized and sectioned the right utero-ovarian ligament.  We cauterized and sectioned the right round ligament.  The broad ligament is open close to the uterus.  The anterior visceral peritoneum is opened and the bladder is distended past the [facility] ring.  We proceeded exactly the same way on the left side.  The bladder is further descended past the [facility] ring.  We then cauterized and sectioned the right and left uterine arteries.  The  uterus is sectioned with the scissors at the junction between the uterus and the cervix.  The uterus is completely detached with its fibroids and the proximal aspect of the tubes.  The endocervical canal is cauterized with the bipolar clamp.  We confirmed good hemostasis at all levels after irrigating and suctioning the pelvic cavity.  Both ureters show good peristalsis.  Urine is clear.  Pictures are taken of the remaining cervix and both ovaries.  We remove all robotic instruments.  The robot is undocked.  We continued by laparoscopy.      The deep Trendelenburg is removed.  We used a 15 cm Endobag to retrieve the specimen consisting of the uterus with fibroids and the proximal part of the tubes.  We used the scalpel technique to extract the uterus with fibroids as a cylinder from the bag at the skin.  The specimen is sent to pathology.  The CO2 is evacuated.  We closed the supraumbilical incision by attaching the pursestring stitch at the aponeurosis.  All incisions are closed with a subcuticular suture of Vicryl 4-0.  Dermabond is added on all incisions.  We then proceeded with the left Bartholin cyst marsupialization.  The left Bartholin cyst is incised with a scalpel over 2 cm.  A dark brown thick fluid is evacuated.  Separate stitches of Vicryl 2-0 are put all around the incision including the cyst wall and the skin all around.  About 8 stitches are done.  Hemostasis is adequate.  No complication occurred.  The patient was brought to recovery room in good and stable status.  ESTIMATED BLOOD LOSS: 50 mL   Intake/Output Summary (Last 24 hours) at 05/08/2020 1136 Last data filed at 05/08/2020 1130 Gross per 24 hour  Intake 1500 ml  Output 170 ml  Net 1330 ml     BLOOD ADMINISTERED:none   LOCAL MEDICATIONS USED:  MARCAINE     SPECIMEN:  Source of Specimen:  Uterus (without cervix), bilateral tubes (status post bilateral tubal ligation).  DISPOSITION OF SPECIMEN:  PATHOLOGY  COUNTS:   YES  PLAN OF CARE: Transfer to PACU  Marie-Lyne LavoieMD11:36 AM

## 2020-05-08 NOTE — Anesthesia Postprocedure Evaluation (Signed)
Anesthesia Post Note  Patient: Jaclyn Fields  Procedure(s) Performed: XI ROBOTIC ASSISTED SUPRACERVICAL HYSTERECTOMY WITH BILATERAL SALPINGECTOMY (Bilateral Abdomen) BARTHOLIN CYST MARSUPIALIZATION (Left )     Patient location during evaluation: PACU Anesthesia Type: General Level of consciousness: awake and alert Pain management: pain level controlled Vital Signs Assessment: post-procedure vital signs reviewed and stable Respiratory status: spontaneous breathing, nonlabored ventilation, respiratory function stable and patient connected to nasal cannula oxygen Cardiovascular status: blood pressure returned to baseline and stable Postop Assessment: no apparent nausea or vomiting Anesthetic complications: no   No complications documented.  Last Vitals:  Vitals:   05/08/20 1255 05/08/20 1315  BP: (!) 146/84 (!) 146/70  Pulse: 78 75  Resp: 18 17  Temp: 36.9 C   SpO2: 98% 98%    Last Pain:  Vitals:   05/08/20 1315  TempSrc:   PainSc: [place] Keiasha Diep

## 2020-05-08 NOTE — Anesthesia Procedure Notes (Signed)
Procedure Name: Intubation Date/Time: 05/08/2020 8:31 AM Performed by: Gerald Leitz, CRNA Pre-anesthesia Checklist: Patient identified, Patient being monitored, Timeout performed, Emergency Drugs available and Suction available Patient Re-evaluated:Patient Re-evaluated prior to induction Oxygen Delivery Method: Circle system utilized Preoxygenation: Pre-oxygenation with 100% oxygen Induction Type: IV induction Ventilation: Mask ventilation without difficulty Laryngoscope Size: Mac and 3 Grade View: Grade I Tube type: Oral Tube size: 7.5 mm Number of attempts: 1 Placement Confirmation: ETT inserted through vocal cords under direct vision,  positive ETCO2 and breath sounds checked- equal and bilateral Secured at: 21 cm Tube secured with: Tape Dental Injury: Teeth and Oropharynx as per pre-operative assessment

## 2020-05-08 NOTE — Discharge Instructions (Signed)
Supracervical Hysterectomy, Care After This sheet gives you information about how to care for yourself after your procedure. Your health care provider may also give you more specific instructions. If you have problems or questions, contact your health care provider. What can I expect after the procedure? After the procedure, it is common to have some discomfort, tenderness, swelling, and bruising at the surgical area. This normally lasts for about 2 weeks. Follow these instructions at home: Medicines  Take over-the-counter and prescription medicines only as told by your health care provider.  Do not take aspirin. It can cause bleeding. Ask your health care provider when it is safe to use aspirin again.  Do not drive or use heavy machinery while taking prescription pain medicine.  To prevent or treat constipation while you are taking prescription pain medicine, your health care provider may recommend that you: ? Drink enough fluid to keep your urine pale yellow. ? Take over-the-counter or prescription medicines. ? Eat foods that are high in fiber, such as fresh fruits and vegetables, whole grains, and beans. ? Limit foods that are high in fat and processed sugars, such as fried and sweet foods. Activity  Get plenty of rest and sleep.  Try to have someone home with you for 1-2 weeks to help you with everyday chores.  Return to your normal activities as told by your health care provider. Ask your health care provider what activities are safe for you.  Do not lift anything that is heavier than 10 lb (4.5 kg) or the limit that your health care provider tells you until he or she says that it is safe.  Do not douche, use tampons, or have sex for at least 6 weeks or until your health care provider says it is safe to do so. Incision care   Follow instructions from your health care provider about how to take care of your incisions. Make sure you: ? Wash your hands with soap and water before  you change your bandage (dressing). If soap and water are not available, use hand sanitizer. ? Change your dressing as told by your health care provider. ? Leave stitches (sutures), skin glue, or adhesive strips in place. These skin closures may need to stay in place for 2 weeks or longer. If adhesive strip edges start to loosen and curl up, you may trim the loose edges. Do not remove adhesive strips completely unless your health care provider tells you to do that.  Check your incision area every day for signs of infection. Check for: ? Redness, swelling, or pain. ? Fluid or blood. ? Warmth. ? Pus or a bad smell.  Take showers instead of baths for 2-3 weeks or as told by your health care provider. Eating and drinking  Drink enough fluids to keep your urine clear or pale yellow.  Do not drink alcohol until your health care provider says it is okay to do so. General instructions  Monitor your temperature for as long as told by your health care provider.  Keep all follow-up visits as told by your health care provider. This is important. Contact a health care provider if:  You have redness, swelling, or pain around an incision.  You have chills or fever.  You have fluid or blood coming from an incision.  An incision feels warm to the touch.  You have pus or a bad smell coming from an incision.  Your incisions break open.  You feel dizzy or lightheaded.  You have pain or  bleeding when you urinate.  You have diarrhea that does not go away.  You have nausea and vomiting that do not go away.  You have abnormal vaginal discharge.  You have a rash.  You have pain that does not go away when you take medicine. Get help right away if:  You have a fever and your symptoms suddenly get worse.  You have severe abdominal pain.  You have chest pain.  You have shortness of breath.  You faint.  You have pain, swelling, or redness in your leg.  You have heavy vaginal bleeding  with blood clots. Summary  After the procedure, it is common to have some discomfort, tenderness, swelling, and bruising at the surgical site. This normally lasts for about 2 weeks.  Get help right away if you have excessive vaginal bleeding or severe abdominal pain. This information is not intended to replace advice given to you by your health care provider. Make sure you discuss any questions you have with your health care provider. Document Revised: 05/23/2017 Document Reviewed: 09/05/2016 Elsevier Patient Education  [place].

## 2020-05-08 NOTE — Transfer of Care (Signed)
Immediate Anesthesia Transfer of Care Note  Patient: Jaclyn Fields  Procedure(s) Performed: Procedure(s): XI ROBOTIC ASSISTED SUPRACERVICAL HYSTERECTOMY WITH BILATERAL SALPINGECTOMY (Bilateral) BARTHOLIN CYST MARSUPIALIZATION (Left)  Patient Location: PACU  Anesthesia Type:General  Level of Consciousness: Alert, Awake, Oriented  Airway & Oxygen Therapy: Patient Spontanous Breathing  Post-op Assessment: Report given to RN  Post vital signs: Reviewed and stable  Last Vitals:  Vitals:   05/08/20 0730  BP: (!) 179/94  Pulse: 81  Resp: 18  Temp: 36.8 C  SpO2: 90%    Complications: No apparent anesthesia complications  Complications: No complications documented.

## 2020-05-08 NOTE — Discharge Summary (Signed)
Physician Discharge Summary  Patient ID: Jaclyn Fields MRN: [id number] DOB/AGE: 1970-11-27 49 y.o.  Admit date: 05/08/2020 Discharge date: 05/08/2020  Admission Diagnoses: Postoperative state [Z98.890] Post-operative state [Z98.890]   Discharge Diagnoses:  Active Problems:   Postoperative state   Post-operative state   Discharged Condition: good  Consults:None  Significant Diagnostic Studies: labs: Hb 10.4  Treatments:surgery: XI Robotic Supracervical Hysterectomy, bilateral salpingectomy, lysis of adhesions.  Left Bartholin cyst Marsupialisation.  Vitals:   05/08/20 1315 05/08/20 1410  BP: (!) 146/70 (!) 159/80  Pulse: 75 75  Resp: 17   Temp:  (!) 97.4 F (36.3 C)  SpO2: 98% 99%     Total I/O In: 2220 [P.O.:720; I.V.:1400; IV Piggyback:100] Out: 970 [Urine:920; Blood:50]   Hospital Course: Good  Discharge Exam:  Normal postop  Disposition: Home     Allergies as of 05/08/2020      Reactions   Sulfa Antibiotics Swelling, Rash   SWELLING TO FINGERS      Medication List    STOP taking these medications   megestrol 40 MG tablet Commonly known as: MEGACE   norgestimate-ethinyl estradiol 0.25-35 MG-MCG tablet Commonly known as: ORTHO-CYCLEN     TAKE these medications   bimatoprost 0.03 % ophthalmic solution Commonly known as: Latisse Place 1 application into both eyes at bedtime. Place one drop on applicator and apply evenly along the skin of the upper eyelid at base of eyelashes once daily at bedtime; repeat procedure for second eye (use a clean applicator).   cetirizine 10 MG tablet Commonly known as: ZYRTEC Take 1 tablet (10 mg total) by mouth daily. What changed: when to take this   cholecalciferol 25 MCG (1000 UNIT) tablet Commonly known as: VITAMIN D3 Take 1,000 Units by mouth daily.   FISH OIL PO Take 1 capsule by mouth daily.   fluticasone 50 MCG/ACT nasal spray Commonly known as: FLONASE SPRAY 2 SPRAYS INTO EACH NOSTRIL  EVERY DAY What changed:   how much to take  how to take this  when to take this  additional instructions   HAIR/SKIN/NAILS PO Take 2 tablets by mouth daily.   hydrochlorothiazide 25 MG tablet Commonly known as: HYDRODIURIL Take 1 tablet (25 mg total) by mouth daily.   ibuprofen 200 MG tablet Commonly known as: ADVIL Take 400-800 mg by mouth every 6 (six) hours as needed for moderate pain. What changed: Another medication with the same name was removed. Continue taking this medication, and follow the directions you see here.   metoprolol succinate 100 MG 24 hr tablet Commonly known as: TOPROL-XL Take 1 tablet (100 mg total) by mouth daily. Take with or immediately following a meal.   MULTIVITAMIN GUMMIES WOMENS PO Take 2 tablets by mouth daily.   oxyCODONE-acetaminophen 7.5-325 MG tablet Commonly known as: Percocet Take 1 tablet by mouth every 6 (six) hours as needed for severe pain.   VITAMIN C GUMMIE PO Take 1 tablet by mouth daily.           SignedPrincess Bruins 05/08/2020, 3:56 PM

## 2020-05-09 ENCOUNTER — Encounter (HOSPITAL_BASED_OUTPATIENT_CLINIC_OR_DEPARTMENT_OTHER): Payer: Self-pay | Admitting: Obstetrics & Gynecology

## 2020-05-09 LAB — PAP IG W/ RFLX HPV ASCU

## 2020-05-09 LAB — SURGICAL PATHOLOGY

## 2020-05-11 ENCOUNTER — Other Ambulatory Visit: Payer: Self-pay | Admitting: Obstetrics & Gynecology

## 2020-05-17 ENCOUNTER — Other Ambulatory Visit: Payer: Self-pay | Admitting: Obstetrics & Gynecology

## 2020-05-18 ENCOUNTER — Other Ambulatory Visit: Payer: Self-pay | Admitting: Family Medicine

## 2020-05-18 DIAGNOSIS — I10 Essential (primary) hypertension: Secondary | ICD-10-CM

## 2020-06-01 ENCOUNTER — Encounter: Payer: Self-pay | Admitting: Obstetrics & Gynecology

## 2020-06-01 ENCOUNTER — Ambulatory Visit (INDEPENDENT_AMBULATORY_CARE_PROVIDER_SITE_OTHER): Payer: BC Managed Care – PPO | Admitting: Obstetrics & Gynecology

## 2020-06-01 ENCOUNTER — Other Ambulatory Visit: Payer: Self-pay

## 2020-06-01 VITALS — BP 128/84

## 2020-06-01 DIAGNOSIS — Z09 Encounter for follow-up examination after completed treatment for conditions other than malignant neoplasm: Secondary | ICD-10-CM

## 2020-06-01 NOTE — Progress Notes (Signed)
    Jaclyn Fields [birth year] [id number]        49 y.o.  G2P1A1L1   RP: Postop XI Supracervical Hysterectomy with Bilateral Salpingectomy on 05/08/2020  HPI: Excellent postop healing.  No abdominopelvic pain.  No vaginal bleeding.  No vaginal discharge.  No fever.  Urine/BMs normal.   OB History  Gravida Para Term Preterm AB Living  2 1 1   1 1   SAB IAB Ectopic Multiple Live Births    1     1    # Outcome Date GA Lbr Len/2nd Weight Sex Delivery Anes PTL Lv  2 Term 08/23/11 [redacted]w[redacted]d  6 lb 10.9 oz (3.03 kg) F CS-LTranv Spinal  LIV  1 IAB             Past medical history,surgical history, problem list, medications, allergies, family history and social history were all reviewed and documented in the EPIC chart.   Directed ROS with pertinent positives and negatives documented in the history of present illness/assessment and plan.  Exam:  Vitals:   06/01/20 1013  BP: 128/84   General appearance:  Normal  Abdomen: Normal.  Incisions well closed with no erythema or induration.  Gynecologic exam: Vulva normal.  Speculum:  Cervix/Vagina normal.  Normal secretions, no blood.   Assessment/Plan:  49 y.o. G2P1011   1. Status post gynecological surgery, follow-up exam Excellent postop healing.  No complication.  Postop precautions at 3 weeks reviewed.  Patient will progressively increase physical activities.  Started back working.  We will follow-up after 4 weeks for final postop recovery evaluation.  [name] MD, 10:23 AM 06/01/2020

## 2020-06-17 ENCOUNTER — Other Ambulatory Visit: Payer: Self-pay | Admitting: Family Medicine

## 2020-06-17 DIAGNOSIS — I10 Essential (primary) hypertension: Secondary | ICD-10-CM

## 2020-06-18 NOTE — Telephone Encounter (Signed)
Requested Prescriptions  Pending Prescriptions Disp Refills   metoprolol succinate (TOPROL-XL) 100 MG 24 hr tablet [Pharmacy Med Name: METOPROLOL SUCC ER 100 MG TAB] 90 tablet 0    Sig: TAKE 1 TABLET BY MOUTH DAILY. TAKE WITH OR IMMEDIATELY FOLLOWING A MEAL.     Cardiovascular:  Beta Blockers Passed - 06/17/2020  9:30 AM      Passed - Last BP in normal range    BP Readings from Last 1 Encounters:  06/01/20 128/84         Passed - Last Heart Rate in normal range    Pulse Readings from Last 1 Encounters:  05/08/20 75         Passed - Valid encounter within last 6 months    Recent Outpatient Visits          4 months ago Essential hypertension   [place], [place]   6 months ago [facility] Juline Patch, MD   11 months ago Essential hypertension   [facility] Juline Patch, MD   1 year ago Acute maxillary sinusitis, recurrence not specified   [facility] Juline Patch, MD   1 year ago Essential hypertension   [facility] Juline Patch, MD      Future Appointments            In 2 weeks Juline Patch, MD [facility], [place]   In 10 months [name], MD [facility], [place]

## 2020-06-28 ENCOUNTER — Encounter: Payer: Self-pay | Admitting: Obstetrics & Gynecology

## 2020-06-28 ENCOUNTER — Ambulatory Visit (INDEPENDENT_AMBULATORY_CARE_PROVIDER_SITE_OTHER): Payer: BC Managed Care – PPO | Admitting: Obstetrics & Gynecology

## 2020-06-28 ENCOUNTER — Other Ambulatory Visit: Payer: Self-pay

## 2020-06-28 VITALS — BP 150/90

## 2020-06-28 DIAGNOSIS — N750 Cyst of Bartholin's gland: Secondary | ICD-10-CM | POA: Diagnosis not present

## 2020-06-28 DIAGNOSIS — Z713 Dietary counseling and surveillance: Secondary | ICD-10-CM | POA: Diagnosis not present

## 2020-06-28 DIAGNOSIS — Z09 Encounter for follow-up examination after completed treatment for conditions other than malignant neoplasm: Secondary | ICD-10-CM

## 2020-06-28 NOTE — Progress Notes (Signed)
    Jaclyn Fields 1970-08-23 [id number]        50 y.o.  G2P1A1L1   RP: Postop XI Supracervical Hysterectomy with Bilateral Salpingectomy on 05/08/2020  HPI: Excellent postop healing.  No abdominopelvic pain.  No vaginal bleeding.  No vaginal discharge.  No fever.  Urine/BMs normal.  Resumed working.  Ready to start back with fitness.   OB History  Gravida Para Term Preterm AB Living  2 1 1   1 1   SAB IAB Ectopic Multiple Live Births    1     1    # Outcome Date GA Lbr Len/2nd Weight Sex Delivery Anes PTL Lv  2 Term 08/23/11 [redacted]w[redacted]d  6 lb 10.9 oz (3.03 kg) F CS-LTranv Spinal  LIV  1 IAB             Past medical history,surgical history, problem list, medications, allergies, family history and social history were all reviewed and documented in the EPIC chart.   Directed ROS with pertinent positives and negatives documented in the history of present illness/assessment and plan.  Exam:  Vitals:   06/28/20 1506  BP: (!) 150/90   General appearance:  Normal  Abdomen: Incisions well healed, closed with no erythema or induration  Gynecologic exam: Vulva normal.  S/P Lt Bartholin Cyst Marsupialization, well healed, no recurrence.  Bimanual exam:  Cervix normal.  No pelvic mass, NT.   Assessment/Plan:  50 y.o. G2P1011   1. Status post gynecological surgery, follow-up exam Good postop healing without complication.    2. Cyst of left Bartholin's gland Well healed post Lt Bartholin's gland Marsupialization, no recurrence.  3. Weight loss counseling Low calorie/carb diet recommended and discussed with Intermittent Fasting.  Aerobic activities 5 times a week and light weight lifting every 2 days.  54 MD, 3:30 PM 06/28/2020

## 2020-07-04 ENCOUNTER — Ambulatory Visit: Payer: Federal, State, Local not specified - PPO | Admitting: Family Medicine

## 2020-07-07 ENCOUNTER — Encounter: Payer: Self-pay | Admitting: Family Medicine

## 2020-07-07 ENCOUNTER — Ambulatory Visit (INDEPENDENT_AMBULATORY_CARE_PROVIDER_SITE_OTHER): Payer: Federal, State, Local not specified - PPO | Admitting: Family Medicine

## 2020-07-07 VITALS — BP 138/82 | HR 68 | Ht 63.0 in | Wt 220.0 lb

## 2020-07-07 DIAGNOSIS — B009 Herpesviral infection, unspecified: Secondary | ICD-10-CM | POA: Diagnosis not present

## 2020-07-07 DIAGNOSIS — Z20822 Contact with and (suspected) exposure to covid-19: Secondary | ICD-10-CM | POA: Diagnosis not present

## 2020-07-07 DIAGNOSIS — I10 Essential (primary) hypertension: Secondary | ICD-10-CM | POA: Diagnosis not present

## 2020-07-07 DIAGNOSIS — J0100 Acute maxillary sinusitis, unspecified: Secondary | ICD-10-CM | POA: Diagnosis not present

## 2020-07-07 MED ORDER — HYDROCHLOROTHIAZIDE 25 MG PO TABS: 25.0000 mg | ORAL_TABLET | Freq: Every day | ORAL | 1 refills | Status: DC

## 2020-07-07 MED ORDER — METOPROLOL SUCCINATE ER 100 MG PO TB24: ORAL_TABLET | ORAL | 1 refills | Status: DC

## 2020-07-07 MED ORDER — VALACYCLOVIR HCL 1 G PO TABS: 1000.0000 mg | ORAL_TABLET | Freq: Two times a day (BID) | ORAL | 0 refills | Status: DC

## 2020-07-07 MED ORDER — AMOXICILLIN 500 MG PO CAPS: 500.0000 mg | ORAL_CAPSULE | Freq: Three times a day (TID) | ORAL | 0 refills | Status: DC

## 2020-07-07 NOTE — Progress Notes (Signed)
Date:  07/07/2020   Name:  Jaclyn Fields   DOB:  Oct 23, 1970   MRN:  [id number]   Chief Complaint: Hypertension  Hypertension This is a chronic problem. The current episode started more than 1 year ago. The problem has been gradually improving since onset. The problem is controlled. Pertinent negatives include no anxiety, blurred vision, chest pain, headaches, malaise/fatigue, neck pain, orthopnea, palpitations, peripheral edema, PND, shortness of breath or sweats. There are no associated agents to hypertension. Risk factors for coronary artery disease include post-menopausal state. Past treatments include beta blockers and diuretics. The current treatment provides moderate improvement. There are no compliance problems.  There is no history of angina, kidney disease, CAD/MI, CVA, heart failure, left ventricular hypertrophy, PVD or retinopathy. There is no history of chronic renal disease, a hypertension causing med or renovascular disease.  Sinusitis This is a chronic problem. The current episode started more than 1 year ago. The problem has been gradually improving since onset. There has been no fever. The pain is moderate. Associated symptoms include congestion and sinus pressure. Pertinent negatives include no chills, coughing, diaphoresis, ear pain, headaches, hoarse voice, neck pain, shortness of breath, sneezing, sore throat or swollen glands. (Bloody nasal discharge) Past treatments include oral decongestants. The treatment provided no relief.    Lab Results  Component Value Date   CREATININE 0.85 04/27/2020   BUN 20 04/27/2020   NA 141 04/27/2020   K 3.6 04/27/2020   CL 105 04/27/2020   CO2 25 04/27/2020   Lab Results  Component Value Date   CHOL 182 01/21/2020   HDL 53 01/21/2020   LDLCALC 112 (H) 01/21/2020   TRIG 93 01/21/2020   CHOLHDL 3.2 04/12/2016   Lab Results  Component Value Date   TSH 1.73 03/03/2020   No results found for: HGBA1C Lab Results   Component Value Date   WBC 20.2 (H) 05/08/2020   HGB 10.4 (L) 05/08/2020   HCT 32.5 (L) 05/08/2020   MCV 93.1 05/08/2020   PLT 422 (H) 05/08/2020   Lab Results  Component Value Date   ALT 14 01/21/2020   AST 18 01/21/2020   ALKPHOS 106 01/21/2020   BILITOT 0.3 01/21/2020     Review of Systems  Constitutional: Negative.  Negative for chills, diaphoresis, fatigue, fever, malaise/fatigue and unexpected weight change.  HENT: Positive for congestion and sinus pressure. Negative for ear discharge, ear pain, hoarse voice, rhinorrhea, sneezing and sore throat.   Eyes: Negative for blurred vision, double vision, photophobia, pain, discharge, redness and itching.  Respiratory: Negative for cough, shortness of breath, wheezing and stridor.   Cardiovascular: Negative for chest pain, palpitations, orthopnea and PND.  Gastrointestinal: Negative for abdominal pain, blood in stool, constipation, diarrhea, nausea and vomiting.  Endocrine: Negative for cold intolerance, heat intolerance, polydipsia, polyphagia and polyuria.  Genitourinary: Negative for dysuria, flank pain, frequency, hematuria, menstrual problem, pelvic pain, urgency, vaginal bleeding and vaginal discharge.  Musculoskeletal: Negative for arthralgias, back pain, myalgias and neck pain.  Skin: Negative for rash.  Allergic/Immunologic: Negative for environmental allergies and food allergies.  Neurological: Negative for dizziness, weakness, light-headedness, numbness and headaches.  Hematological: Negative for adenopathy. Does not bruise/bleed easily.  Psychiatric/Behavioral: Negative for dysphoric mood. The patient is not nervous/anxious.     Patient Active Problem List   Diagnosis Date Noted  . Postoperative state 05/08/2020  . Post-operative state 05/08/2020  . Essential hypertension 01/02/2018  . Transfusion history 08/12/2012  . Postpartum hemorrhage 09/05/2011  . PP  care - s/p 1C/S 3/1 (breech, [facility]) 08/24/2011  . Maternal  iron deficiency anemia 08/24/2011  . Benign essential hypertension complicating pregnancy, childbirth, and the puerperium 08/23/2011    Allergies  Allergen Reactions  . Sulfa Antibiotics Swelling and Rash    SWELLING TO FINGERS    Past Surgical History:  Procedure Laterality Date  . AUGMENTATION MAMMAPLASTY Bilateral 2005  . BARTHOLIN CYST MARSUPIALIZATION Left 05/08/2020   Procedure: BARTHOLIN CYST MARSUPIALIZATION;  Surgeon: Genia Del, MD;  Location: [facility];  Service: Gynecology;  Laterality: Left;  . CESAREAN SECTION    . COLONOSCOPY  2018  . DILATION AND EVACUATION  09/05/2011   Procedure: DILATATION AND EVACUATION;  Surgeon: Robley Fries, MD;  Location: [facility];  Service: Gynecology;  Laterality: Bilateral;  . ROBOTIC ASSISTED SUPRACERVICAL HYSTERECTOMY WITH BILATERAL SALPINGO OOPHERECTOMY Bilateral 05/08/2020   Procedure: XI ROBOTIC ASSISTED SUPRACERVICAL HYSTERECTOMY WITH BILATERAL SALPINGECTOMY;  Surgeon: Genia Del, MD;  Location: [facility] ;  Service: Gynecology;  Laterality: Bilateral;  . TUBAL LIGATION    . WISDOM TOOTH EXTRACTION      Social History   Tobacco Use  . Smoking status: Never Smoker  . Smokeless tobacco: Never Used  Vaping Use  . Vaping Use: Never used  Substance Use Topics  . Alcohol use: Yes    Comment: occasionally  . Drug use: No     Medication list has been reviewed and updated.  Current Meds  Medication Sig  . Ascorbic Acid (VITAMIN C GUMMIE PO) Take 1 tablet by mouth daily.  . bimatoprost (LATISSE) 0.03 % ophthalmic solution Place 1 application into both eyes at bedtime. Place one drop on applicator and apply evenly along the skin of the upper eyelid at base of eyelashes once daily at bedtime; repeat procedure for second eye (use a clean applicator).  . Biotin w/ Vitamins C & E (HAIR/SKIN/NAILS PO) Take 2 tablets by mouth daily.  . cetirizine (ZYRTEC) 10 MG tablet Take 1 tablet (10 mg  total) by mouth daily. (Patient taking differently: Take 10 mg by mouth at bedtime.)  . cholecalciferol (VITAMIN D3) 25 MCG (1000 UNIT) tablet Take 1,000 Units by mouth daily.  . fluticasone (FLONASE) 50 MCG/ACT nasal spray SPRAY 2 SPRAYS INTO EACH NOSTRIL EVERY DAY (Patient taking differently: Place 2 sprays into both nostrils at bedtime.)  . hydrochlorothiazide (HYDRODIURIL) 25 MG tablet Take 1 tablet (25 mg total) by mouth daily.  Marland Kitchen ibuprofen (ADVIL) 200 MG tablet Take 400-800 mg by mouth every 6 (six) hours as needed for moderate pain.  . metoprolol succinate (TOPROL-XL) 100 MG 24 hr tablet TAKE 1 TABLET BY MOUTH DAILY. TAKE WITH OR IMMEDIATELY FOLLOWING A MEAL.  . Multiple Vitamins-Minerals (MULTIVITAMIN GUMMIES WOMENS PO) Take 2 tablets by mouth daily.  . Omega-3 Fatty Acids (FISH OIL PO) Take 1 capsule by mouth daily.    PHQ 2/9 Scores 01/21/2020 11/30/2019 01/08/2019 04/01/2018  PHQ - 2 Score 0 0 0 0  PHQ- 9 Score 0 0 - -    GAD 7 : Generalized Anxiety Score 01/21/2020 11/30/2019  Nervous, Anxious, on Edge 0 0  Control/stop worrying 0 0  Worry too much - different things 0 0  Trouble relaxing 0 0  Restless 0 0  Easily annoyed or irritable 0 0  Afraid - awful might happen 0 0  Total GAD 7 Score 0 0    BP Readings from Last 3 Encounters:  07/07/20 138/82  06/28/20 (!) 150/90  06/01/20 128/84  Physical Exam Vitals and nursing note reviewed.  Constitutional:      Appearance: She is well-developed and well-nourished.  HENT:     Head: Normocephalic.     Right Ear: Tympanic membrane, ear canal and external ear normal. There is no impacted cerumen.     Left Ear: Tympanic membrane, ear canal and external ear normal. There is no impacted cerumen.     Nose: No congestion or rhinorrhea.     Right Sinus: Maxillary sinus tenderness present.     Left Sinus: Maxillary sinus tenderness present.     Mouth/Throat:     Mouth: Oropharynx is clear and moist. Mucous membranes are moist.   Eyes:     General: Lids are everted, no foreign bodies appreciated. No scleral icterus.       Left eye: No foreign body or hordeolum.     Extraocular Movements: EOM normal.     Conjunctiva/sclera: Conjunctivae normal.     Right eye: Right conjunctiva is not injected.     Left eye: Left conjunctiva is not injected.     Pupils: Pupils are equal, round, and reactive to light.  Neck:     Thyroid: No thyromegaly.     Vascular: No JVD.     Trachea: No tracheal deviation.  Cardiovascular:     Rate and Rhythm: Normal rate and regular rhythm.     Pulses: Intact distal pulses.     Heart sounds: Normal heart sounds. No murmur heard. No friction rub. No gallop.   Pulmonary:     Effort: Pulmonary effort is normal. No respiratory distress.     Breath sounds: Normal breath sounds. No wheezing, rhonchi or rales.  Abdominal:     General: Bowel sounds are normal.     Palpations: Abdomen is soft. There is no hepatosplenomegaly or mass.     Tenderness: There is no abdominal tenderness. There is no guarding or rebound.  Musculoskeletal:        General: No tenderness or edema. Normal range of motion.     Cervical back: Normal range of motion and neck supple.  Lymphadenopathy:     Head:     Right side of head: Submandibular adenopathy present.     Left side of head: Submandibular adenopathy present.     Cervical: No cervical adenopathy.     Comments: tenderness  Skin:    General: Skin is warm.     Findings: No rash.  Neurological:     Mental Status: She is alert and oriented to person, place, and time.     Cranial Nerves: No cranial nerve deficit.     Deep Tendon Reflexes: Strength normal. Reflexes normal.  Psychiatric:        Mood and Affect: Mood and affect normal. Mood is not anxious or depressed.     Wt Readings from Last 3 Encounters:  07/07/20 220 lb (99.8 kg)  05/08/20 221 lb 3.2 oz (100.3 kg)  05/05/20 221 lb (100.2 kg)    BP 138/82   Pulse 68   Ht 5\' 3"  (1.6 m)   Wt 220 lb  (99.8 kg)   LMP 04/27/2020 Comment: Supracervical   BMI 38.97 kg/m   Assessment and Plan:  1. Essential hypertension Chronic.  Controlled.  Stable.  Blood pressure 138/82.  We will continue hydrochlorothiazide 25 mg and metoprolol XL 100 mg once a day each.  Review of patient's previous renal function panel was unremarkable at this time and we will repeat in 6 months - hydrochlorothiazide (  HYDRODIURIL) 25 MG tablet; Take 1 tablet (25 mg total) by mouth daily.  Dispense: 90 tablet; Refill: 1 - metoprolol succinate (TOPROL-XL) 100 MG 24 hr tablet; TAKE 1 TABLET BY MOUTH DAILY. TAKE WITH OR IMMEDIATELY FOLLOWING A MEAL.  Dispense: 90 tablet; Refill: 1  2. Acute maxillary sinusitis, recurrence not specified New onset.  Persistent.  Stable.  But symptomatic and exam and history is consistent with an acute sinusitis and maxillary sinuses.  We will treat with amoxicillin 500 mg 1 3 times a day for 10 days. - amoxicillin (AMOXIL) 500 MG capsule; Take 1 capsule (500 mg total) by mouth 3 (three) times daily.  Dispense: 30 capsule; Refill: 0  3. 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  Recurrent.  Patient has had episodes that occasional HSV-1 of the lips.  We will provide valacyclovir 1 g 1-2 twice a day x24 hours.  As needed for recurrence of fever blister. - valACYclovir (VALTREX) 1000 MG tablet; Take 1 tablet (1,000 mg total) by mouth 2 (two) times daily.  Dispense: 10 tablet; Refill: 0

## 2020-07-13 DIAGNOSIS — Z1159 Encounter for screening for other viral diseases: Secondary | ICD-10-CM | POA: Diagnosis not present

## 2020-07-14 ENCOUNTER — Telehealth: Payer: Self-pay

## 2020-07-14 NOTE — Telephone Encounter (Signed)
Spoke to pt- she will continue Amox and alternate tylenol and Ibuprofen, push fluids and wear a mask

## 2020-07-14 NOTE — Telephone Encounter (Signed)
Copied from [place] [id number]. Topic: General - Other >> Jul 14, 2020 10:05 AM Jaclyn Fields wrote: Reason for CRM: Pt called and asked for Baxter Flattery to call her/ she received her covid results today and she is positive/please advise

## 2020-07-24 ENCOUNTER — Ambulatory Visit (INDEPENDENT_AMBULATORY_CARE_PROVIDER_SITE_OTHER): Payer: BC Managed Care – PPO | Admitting: Family Medicine

## 2020-07-24 ENCOUNTER — Ambulatory Visit
Admission: RE | Admit: 2020-07-24 | Discharge: 2020-07-24 | Disposition: A | Discharge status: Home / Self Care | Payer: BC Managed Care – PPO | Source: Ambulatory Visit | Attending: Family Medicine | Admitting: Family Medicine

## 2020-07-24 ENCOUNTER — Ambulatory Visit
Admission: RE | Admit: 2020-07-24 | Discharge: 2020-07-24 | Disposition: A | Discharge status: Home / Self Care | Payer: BC Managed Care – PPO | Attending: Family Medicine | Admitting: Family Medicine

## 2020-07-24 ENCOUNTER — Other Ambulatory Visit
Admission: RE | Admit: 2020-07-24 | Discharge: 2020-07-24 | Disposition: A | Discharge status: Home / Self Care | Payer: BC Managed Care – PPO | Source: Home / Self Care | Attending: Family Medicine | Admitting: Family Medicine

## 2020-07-24 ENCOUNTER — Encounter: Payer: Self-pay | Admitting: Family Medicine

## 2020-07-24 ENCOUNTER — Other Ambulatory Visit: Payer: Self-pay

## 2020-07-24 VITALS — BP 130/92 | HR 72 | Ht 63.0 in | Wt 216.0 lb

## 2020-07-24 DIAGNOSIS — R053 Chronic cough: Secondary | ICD-10-CM

## 2020-07-24 DIAGNOSIS — J320 Chronic maxillary sinusitis: Secondary | ICD-10-CM | POA: Insufficient documentation

## 2020-07-24 DIAGNOSIS — J0100 Acute maxillary sinusitis, unspecified: Secondary | ICD-10-CM | POA: Diagnosis not present

## 2020-07-24 DIAGNOSIS — U099 Post covid-19 condition, unspecified: Secondary | ICD-10-CM | POA: Insufficient documentation

## 2020-07-24 DIAGNOSIS — J3489 Other specified disorders of nose and nasal sinuses: Secondary | ICD-10-CM | POA: Diagnosis not present

## 2020-07-24 DIAGNOSIS — R059 Cough, unspecified: Secondary | ICD-10-CM | POA: Diagnosis not present

## 2020-07-24 LAB — CBC WITH DIFFERENTIAL/PLATELET
Abs Immature Granulocytes: 0.05 10*3/uL (ref 0.00–0.07)
Basophils Absolute: 0.1 10*3/uL (ref 0.0–0.1)
Basophils Relative: 0 %
Eosinophils Absolute: 0.2 10*3/uL (ref 0.0–0.5)
Eosinophils Relative: 2 %
HCT: 36.5 % (ref 36.0–46.0)
Hemoglobin: 11.6 g/dL — ABNORMAL LOW (ref 12.0–15.0)
Immature Granulocytes: 0 %
Lymphocytes Relative: 14 %
Lymphs Abs: 1.7 10*3/uL (ref 0.7–4.0)
MCH: 27.3 pg (ref 26.0–34.0)
MCHC: 31.8 g/dL (ref 30.0–36.0)
MCV: 85.9 fL (ref 80.0–100.0)
Monocytes Absolute: 0.8 10*3/uL (ref 0.1–1.0)
Monocytes Relative: 6 %
Neutro Abs: 9.2 10*3/uL — ABNORMAL HIGH (ref 1.7–7.7)
Neutrophils Relative %: 78 %
Platelets: 494 10*3/uL — ABNORMAL HIGH (ref 150–400)
RBC: 4.25 MIL/uL (ref 3.87–5.11)
RDW: 13.2 % (ref 11.5–15.5)
WBC: 11.9 10*3/uL — ABNORMAL HIGH (ref 4.0–10.5)
nRBC: 0 % (ref 0.0–0.2)

## 2020-07-24 MED ORDER — BENZONATATE 100 MG PO CAPS: 100.0000 mg | ORAL_CAPSULE | Freq: Two times a day (BID) | ORAL | 0 refills | Status: DC | PRN

## 2020-07-24 MED ORDER — MONTELUKAST SODIUM 10 MG PO TABS: 10.0000 mg | ORAL_TABLET | Freq: Every day | ORAL | 3 refills | Status: DC

## 2020-07-24 MED ORDER — ALBUTEROL SULFATE HFA 108 (90 BASE) MCG/ACT IN AERS: 2.0000 | INHALATION_SPRAY | Freq: Four times a day (QID) | RESPIRATORY_TRACT | 2 refills | Status: DC | PRN

## 2020-07-24 NOTE — Progress Notes (Signed)
Date:  07/24/2020   Name:  Jaclyn Fields   DOB:  March 21, 1971   MRN:  [id number]   Chief Complaint: Cough (Finished Amox on Jan 24th- still having a dry cough with some evening production- otc cough syrup and hot tea is not helping)  Cough This is a new problem. The current episode started 1 to 4 weeks ago (teated positive 21st). The problem has been waxing and waning. The problem occurs every few minutes. The cough is non-productive. Associated symptoms include nasal congestion, postnasal drip, rhinorrhea and a sore throat. Pertinent negatives include no chest pain, chills, ear congestion, ear pain, eye redness, fever, headaches, heartburn, hemoptysis, myalgias, rash, shortness of breath, sweats, weight loss or wheezing. The symptoms are aggravated by lying down. She has tried prescription cough suppressant (antibiotics) for the symptoms. The treatment provided mild relief. There is no history of environmental allergies.    Lab Results  Component Value Date   CREATININE 0.85 04/27/2020   BUN 20 04/27/2020   NA 141 04/27/2020   K 3.6 04/27/2020   CL 105 04/27/2020   CO2 25 04/27/2020   Lab Results  Component Value Date   CHOL 182 01/21/2020   HDL 53 01/21/2020   LDLCALC 112 (H) 01/21/2020   TRIG 93 01/21/2020   CHOLHDL 3.2 04/12/2016   Lab Results  Component Value Date   TSH 1.73 03/03/2020   No results found for: HGBA1C Lab Results  Component Value Date   WBC 20.2 (H) 05/08/2020   HGB 10.4 (L) 05/08/2020   HCT 32.5 (L) 05/08/2020   MCV 93.1 05/08/2020   PLT 422 (H) 05/08/2020   Lab Results  Component Value Date   ALT 14 01/21/2020   AST 18 01/21/2020   ALKPHOS 106 01/21/2020   BILITOT 0.3 01/21/2020     Review of Systems  Constitutional: Negative.  Negative for chills, fatigue, fever, unexpected weight change and weight loss.  HENT: Positive for postnasal drip, rhinorrhea and sore throat. Negative for congestion, ear discharge, ear pain, sinus pressure  and sneezing.   Eyes: Negative for double vision, photophobia, pain, discharge, redness and itching.  Respiratory: Positive for cough. Negative for hemoptysis, shortness of breath, wheezing and stridor.   Cardiovascular: Negative for chest pain.  Gastrointestinal: Negative for abdominal pain, blood in stool, constipation, diarrhea, heartburn, nausea and vomiting.  Endocrine: Negative for cold intolerance, heat intolerance, polydipsia, polyphagia and polyuria.  Genitourinary: Negative for dysuria, flank pain, frequency, hematuria, menstrual problem, pelvic pain, urgency, vaginal bleeding and vaginal discharge.  Musculoskeletal: Negative for arthralgias, back pain and myalgias.  Skin: Negative for rash.  Allergic/Immunologic: Negative for environmental allergies and food allergies.  Neurological: Negative for dizziness, weakness, light-headedness, numbness and headaches.  Hematological: Negative for adenopathy. Does not bruise/bleed easily.  Psychiatric/Behavioral: Negative for dysphoric mood. The patient is not nervous/anxious.     Patient Active Problem List   Diagnosis Date Noted  . Postoperative state 05/08/2020  . Post-operative state 05/08/2020  . Essential hypertension 01/02/2018  . Transfusion history 08/12/2012  . Postpartum hemorrhage 09/05/2011  . PP care - s/p 1C/S 3/1 (breech, [facility]) 08/24/2011  . Maternal iron deficiency anemia 08/24/2011  . Benign essential hypertension complicating pregnancy, childbirth, and the puerperium 08/23/2011    Allergies  Allergen Reactions  . Sulfa Antibiotics Swelling and Rash    SWELLING TO FINGERS    Past Surgical History:  Procedure Laterality Date  . AUGMENTATION MAMMAPLASTY Bilateral 2005  . BARTHOLIN CYST MARSUPIALIZATION Left 05/08/2020  Procedure: BARTHOLIN CYST MARSUPIALIZATION;  Surgeon: [name], MD;  Location: [facility];  Service: Gynecology;  Laterality: Left;  . CESAREAN SECTION    . COLONOSCOPY   2018  . DILATION AND EVACUATION  09/05/2011   Procedure: DILATATION AND EVACUATION;  Surgeon: Elveria Royals, MD;  Location: [facility];  Service: Gynecology;  Laterality: Bilateral;  . ROBOTIC ASSISTED SUPRACERVICAL HYSTERECTOMY WITH BILATERAL SALPINGO OOPHERECTOMY Bilateral 05/08/2020   Procedure: XI ROBOTIC ASSISTED SUPRACERVICAL HYSTERECTOMY WITH BILATERAL SALPINGECTOMY;  Surgeon: [name], MD;  Location: [place];  Service: Gynecology;  Laterality: Bilateral;  . TUBAL LIGATION    . WISDOM TOOTH EXTRACTION      Social History   Tobacco Use  . Smoking status: Never Smoker  . Smokeless tobacco: Never Used  Vaping Use  . Vaping Use: Never used  Substance Use Topics  . Alcohol use: Yes    Comment: occasionally  . Drug use: No     Medication list has been reviewed and updated.  Current Meds  Medication Sig  . Ascorbic Acid (VITAMIN C GUMMIE PO) Take 1 tablet by mouth daily.  . bimatoprost (LATISSE) 0.03 % ophthalmic solution Place 1 application into both eyes at bedtime. Place one drop on applicator and apply evenly along the skin of the upper eyelid at base of eyelashes once daily at bedtime; repeat procedure for second eye (use a clean applicator).  . Biotin w/ Vitamins C & E (HAIR/SKIN/NAILS PO) Take 2 tablets by mouth daily.  . cetirizine (ZYRTEC) 10 MG tablet Take 1 tablet (10 mg total) by mouth daily. (Patient taking differently: Take 10 mg by mouth at bedtime.)  . cholecalciferol (VITAMIN D3) 25 MCG (1000 UNIT) tablet Take 1,000 Units by mouth daily.  . fluticasone (FLONASE) 50 MCG/ACT nasal spray SPRAY 2 SPRAYS INTO EACH NOSTRIL EVERY DAY (Patient taking differently: Place 2 sprays into both nostrils at bedtime.)  . hydrochlorothiazide (HYDRODIURIL) 25 MG tablet Take 1 tablet (25 mg total) by mouth daily.  Marland Kitchen ibuprofen (ADVIL) 200 MG tablet Take 400-800 mg by mouth every 6 (six) hours as needed for moderate pain.  . metoprolol succinate (TOPROL-XL) 100  MG 24 hr tablet TAKE 1 TABLET BY MOUTH DAILY. TAKE WITH OR IMMEDIATELY FOLLOWING A MEAL.  . Multiple Vitamins-Minerals (MULTIVITAMIN GUMMIES WOMENS PO) Take 2 tablets by mouth daily.  . Omega-3 Fatty Acids (FISH OIL PO) Take 1 capsule by mouth daily.  . valACYclovir (VALTREX) 1000 MG tablet Take 1 tablet (1,000 mg total) by mouth 2 (two) times daily.    PHQ 2/9 Scores 01/21/2020 11/30/2019 01/08/2019 04/01/2018  PHQ - 2 Score 0 0 0 0  PHQ- 9 Score 0 0 - -    GAD 7 : Generalized Anxiety Score 01/21/2020 11/30/2019  Nervous, Anxious, on Edge 0 0  Control/stop worrying 0 0  Worry too much - different things 0 0  Trouble relaxing 0 0  Restless 0 0  Easily annoyed or irritable 0 0  Afraid - awful might happen 0 0  Total GAD 7 Score 0 0    BP Readings from Last 3 Encounters:  07/24/20 (!) 130/92  07/07/20 138/82  06/28/20 (!) 150/90    Physical Exam Vitals and nursing note reviewed.  Constitutional:      General: She is not in acute distress.    Appearance: She is not diaphoretic.  HENT:     Head: Normocephalic and atraumatic.     Jaw: There is normal jaw occlusion.  Salivary Glands: Right salivary gland is not diffusely enlarged. Left salivary gland is not diffusely enlarged.     Right Ear: Tympanic membrane, ear canal and external ear normal. There is no impacted cerumen.     Left Ear: Tympanic membrane, ear canal and external ear normal. There is no impacted cerumen.     Nose: No congestion or rhinorrhea.     Right Turbinates: Swollen.     Left Turbinates: Swollen.     Right Sinus: No maxillary sinus tenderness or frontal sinus tenderness.     Left Sinus: Maxillary sinus tenderness present. No frontal sinus tenderness.     Mouth/Throat:     Mouth: Oropharynx is clear and moist. Mucous membranes are moist.  Eyes:     General:        Right eye: No discharge.        Left eye: No discharge.     Extraocular Movements: EOM normal.     Conjunctiva/sclera: Conjunctivae normal.      Pupils: Pupils are equal, round, and reactive to light.  Neck:     Thyroid: No thyroid mass, thyromegaly or thyroid tenderness.     Vascular: No JVD.     Trachea: Trachea and phonation normal.  Cardiovascular:     Rate and Rhythm: Normal rate and regular rhythm.     Pulses: Intact distal pulses.     Heart sounds: Normal heart sounds. No murmur heard. No friction rub. No gallop.   Pulmonary:     Effort: Pulmonary effort is normal.     Breath sounds: Normal breath sounds. No decreased air movement. No decreased breath sounds, wheezing, rhonchi or rales.  Abdominal:     General: Bowel sounds are normal.     Palpations: Abdomen is soft. There is no mass.     Tenderness: There is no abdominal tenderness. There is no guarding.  Musculoskeletal:        General: No edema. Normal range of motion.     Cervical back: Full passive range of motion without pain, normal range of motion and neck supple.  Lymphadenopathy:     Head:     Right side of head: No submandibular or tonsillar adenopathy.     Left side of head: No submandibular or tonsillar adenopathy.     Cervical: No cervical adenopathy.     Right cervical: No superficial or posterior cervical adenopathy.    Left cervical: No superficial or posterior cervical adenopathy.  Skin:    General: Skin is warm and dry.  Neurological:     Mental Status: She is alert.     Deep Tendon Reflexes: Reflexes are normal and symmetric.     Wt Readings from Last 3 Encounters:  07/24/20 216 lb (98 kg)  07/07/20 220 lb (99.8 kg)  05/08/20 221 lb 3.2 oz (100.3 kg)    BP (!) 130/92   Pulse 72   Ht 5\' 3"  (1.6 m)   Wt 216 lb (98 kg)   LMP 04/27/2020 Comment: Supracervical   SpO2 98%   BMI 38.26 kg/m   Assessment and Plan: 1. Chronic maxillary sinusitis New onset.  Persistent.  Patient has had symptoms for about 4 weeks now requiring Robitussin-AC and amoxicillin 10-day course.  Patient continues to have discomfort of her maxillary sinus on  palpation.  We will initiate Singulair 10 mg once a day and have encouraged her to can resume her fluticasone nasal spray with saline lavage and Mucinex DM for the cough.  We will obtain  a sinus x-ray to evaluate for chronic sinusitis. - montelukast (SINGULAIR) 10 MG tablet; Take 1 tablet (10 mg total) by mouth at bedtime.  Dispense: 30 tablet; Refill: 3 - DG Sinuses Complete; Future  2. Post-COVID chronic cough Patient was diagnosed with Covid that was positive about 10 days ago.  Patient has a nonproductive cough which is worse when she lies back at night.  We will treat with Tessalon Perles 100 mg twice a day for 10 days and will obtain a chest x-ray to determine if there is any residual pulmonary concern from the Covid infection.  We will also refill her albuterol inhaler 1 to 2 puffs every 6 hours as needed cough patient has been encouraged to wear her mask at work and that she is unlikely to be contagious at this time - montelukast (SINGULAIR) 10 MG tablet; Take 1 tablet (10 mg total) by mouth at bedtime.  Dispense: 30 tablet; Refill: 3 - DG Chest 2 View; Future - albuterol (VENTOLIN HFA) 108 (90 Base) MCG/ACT inhaler; Inhale 2 puffs into the lungs every 6 (six) hours as needed for wheezing or shortness of breath.  Dispense: 8 g; Refill: 2 - benzonatate (TESSALON) 100 MG capsule; Take 1 capsule (100 mg total) by mouth 2 (two) times daily as needed for cough.  Dispense: 30 capsule; Refill: 0

## 2020-07-25 ENCOUNTER — Other Ambulatory Visit: Payer: Self-pay

## 2020-07-25 DIAGNOSIS — J320 Chronic maxillary sinusitis: Secondary | ICD-10-CM

## 2020-07-25 MED ORDER — AZITHROMYCIN 250 MG PO TABS: ORAL_TABLET | ORAL | 0 refills | Status: DC

## 2020-07-25 NOTE — Progress Notes (Signed)
Sent in [employer]

## 2020-09-29 ENCOUNTER — Encounter: Payer: Self-pay | Admitting: Obstetrics & Gynecology

## 2020-09-29 ENCOUNTER — Ambulatory Visit (INDEPENDENT_AMBULATORY_CARE_PROVIDER_SITE_OTHER): Payer: BC Managed Care – PPO | Admitting: Obstetrics & Gynecology

## 2020-09-29 ENCOUNTER — Other Ambulatory Visit: Payer: Self-pay

## 2020-09-29 VITALS — BP 142/88

## 2020-09-29 DIAGNOSIS — R350 Frequency of micturition: Secondary | ICD-10-CM | POA: Diagnosis not present

## 2020-09-29 MED ORDER — NITROFURANTOIN MONOHYD MACRO 100 MG PO CAPS: 100.0000 mg | ORAL_CAPSULE | Freq: Two times a day (BID) | ORAL | 0 refills | Status: AC

## 2020-09-29 NOTE — Progress Notes (Signed)
    Jaclyn Fields 12/13/1970 [id number]        50 y.o.  G2P1011   RP: Urinary frequency with lower back pain  HPI: Urinary frequency with lower back pain x a few days.  No blood in urine.  No fever.  C/O nasal congestion.  Has seasonal allergies.  No yellow-green nasal discharge.    OB History  Gravida Para Term Preterm AB Living  2 1 1   1 1   SAB IAB Ectopic Multiple Live Births    1     1    # Outcome Date GA Lbr Len/2nd Weight Sex Delivery Anes PTL Lv  2 Term 08/23/11 [redacted]w[redacted]d  6 lb 10.9 oz (3.03 kg) F CS-LTranv Spinal  LIV  1 IAB             Past medical history,surgical history, problem list, medications, allergies, family history and social history were all reviewed and documented in the EPIC chart.   Directed ROS with pertinent positives and negatives documented in the history of present illness/assessment and plan.  Exam:  Vitals:   09/29/20 0916  BP: (!) 142/88   General appearance:  Normal  CVAT Neg bilaterally  Abdomen: Normal  Gynecologic exam: Deferred  U/A: Yellow clear, protein 2+, nitrites negative, white blood cells 0-5, red blood cells 0-2, bacteria moderate.  Urine culture pending.   Assessment/Plan:  50 y.o. G2P1011   1. Frequency of urination Probable acute cystitis per symptoms and urine analysis.  Decision to treat with nitrofurantoin 100 mg capsule per mouth twice a day for 7 days.  Usage reviewed and prescription sent to pharmacy.  Urine culture pending. - Urinalysis,Complete w/RFL Culture  Other orders - nitrofurantoin, macrocrystal-monohydrate, (MACROBID) 100 MG capsule; Take 1 capsule (100 mg total) by mouth 2 (two) times daily for 7 days. - Urine Culture - REFLEXIVE URINE CULTURE  [name] MD, 9:30 AM 09/29/2020

## 2020-10-01 ENCOUNTER — Encounter: Payer: Self-pay | Admitting: Obstetrics & Gynecology

## 2020-10-02 LAB — URINALYSIS, COMPLETE W/RFL CULTURE
Bilirubin Urine: NEGATIVE
Glucose, UA: NEGATIVE
Hyaline Cast: NONE SEEN /LPF
Ketones, ur: NEGATIVE
Leukocyte Esterase: NEGATIVE
Nitrites, Initial: NEGATIVE
Specific Gravity, Urine: 1.016 (ref 1.001–1.03)
pH: 6.5 (ref 5.0–8.0)

## 2020-10-02 LAB — CULTURE INDICATED

## 2020-10-02 LAB — URINE CULTURE
MICRO NUMBER:: 11748501
SPECIMEN QUALITY:: ADEQUATE

## 2020-10-19 ENCOUNTER — Other Ambulatory Visit: Payer: Self-pay | Admitting: Family Medicine

## 2020-10-19 DIAGNOSIS — U099 Post covid-19 condition, unspecified: Secondary | ICD-10-CM

## 2020-10-19 DIAGNOSIS — J320 Chronic maxillary sinusitis: Secondary | ICD-10-CM

## 2020-10-19 NOTE — Telephone Encounter (Signed)
Requested Prescriptions  Pending Prescriptions Disp Refills  . montelukast (SINGULAIR) 10 MG tablet [Pharmacy Med Name: MONTELUKAST SOD 10 MG TABLET] 90 tablet 0    Sig: TAKE 1 TABLET BY MOUTH EVERYDAY AT BEDTIME     Pulmonology:  Leukotriene Inhibitors Passed - 10/19/2020  1:37 AM      Passed - Valid encounter within last 12 months    Recent Outpatient Visits          2 months ago Chronic maxillary sinusitis   [facility] Juline Patch, MD   3 months ago Essential hypertension   [place], [place]   9 months ago Essential hypertension   [place], [place]   10 months ago [place], Deanna C, MD   1 year ago Essential hypertension   [facility] Juline Patch, MD      Future Appointments            In 2 months Juline Patch, MD [facility] [place]   In 6 months [name], [place]

## 2020-10-24 DIAGNOSIS — E079 Disorder of thyroid, unspecified: Secondary | ICD-10-CM | POA: Diagnosis not present

## 2020-12-13 ENCOUNTER — Telehealth: Payer: Self-pay

## 2020-12-13 NOTE — Telephone Encounter (Unsigned)
Copied from [place] [id number]. Topic: General - Inquiry >> Dec 13, 2020 12:48 PM Valere Dross wrote: Reason for CRM: Pt called in requesting Baxter Flattery give her a call back, pt didn't specify why. Please advise

## 2020-12-22 ENCOUNTER — Encounter: Payer: Self-pay | Admitting: Family Medicine

## 2020-12-22 ENCOUNTER — Ambulatory Visit (INDEPENDENT_AMBULATORY_CARE_PROVIDER_SITE_OTHER): Payer: BC Managed Care – PPO | Admitting: Family Medicine

## 2020-12-22 ENCOUNTER — Other Ambulatory Visit: Payer: Self-pay

## 2020-12-22 VITALS — BP 130/70 | HR 76 | Ht 63.0 in | Wt 215.0 lb

## 2020-12-22 DIAGNOSIS — E7801 Familial hypercholesterolemia: Secondary | ICD-10-CM | POA: Diagnosis not present

## 2020-12-22 DIAGNOSIS — J320 Chronic maxillary sinusitis: Secondary | ICD-10-CM | POA: Diagnosis not present

## 2020-12-22 DIAGNOSIS — U099 Post covid-19 condition, unspecified: Secondary | ICD-10-CM

## 2020-12-22 DIAGNOSIS — I10 Essential (primary) hypertension: Secondary | ICD-10-CM | POA: Diagnosis not present

## 2020-12-22 DIAGNOSIS — J4520 Mild intermittent asthma, uncomplicated: Secondary | ICD-10-CM

## 2020-12-22 DIAGNOSIS — B009 Herpesviral infection, unspecified: Secondary | ICD-10-CM

## 2020-12-22 DIAGNOSIS — J301 Allergic rhinitis due to pollen: Secondary | ICD-10-CM | POA: Diagnosis not present

## 2020-12-22 DIAGNOSIS — R053 Chronic cough: Secondary | ICD-10-CM

## 2020-12-22 MED ORDER — FLUTICASONE PROPIONATE 50 MCG/ACT NA SUSP: NASAL | 11 refills | Status: DC

## 2020-12-22 MED ORDER — MONTELUKAST SODIUM 10 MG PO TABS: ORAL_TABLET | ORAL | 1 refills | Status: DC

## 2020-12-22 MED ORDER — HYDROCHLOROTHIAZIDE 25 MG PO TABS
25.0000 mg | ORAL_TABLET | Freq: Every day | ORAL | 1 refills | Status: DC
Start: 2020-12-22 — End: 2021-06-26

## 2020-12-22 MED ORDER — METOPROLOL SUCCINATE ER 100 MG PO TB24: ORAL_TABLET | ORAL | 1 refills | Status: DC

## 2020-12-22 MED ORDER — VALACYCLOVIR HCL 1 G PO TABS: 1000.0000 mg | ORAL_TABLET | Freq: Two times a day (BID) | ORAL | 0 refills | Status: DC

## 2020-12-22 MED ORDER — ALBUTEROL SULFATE HFA 108 (90 BASE) MCG/ACT IN AERS
2.0000 | INHALATION_SPRAY | Freq: Four times a day (QID) | RESPIRATORY_TRACT | 2 refills | Status: DC | PRN
Start: 2020-12-22 — End: 2021-12-21

## 2020-12-22 NOTE — Progress Notes (Signed)
Date:  12/22/2020   Name:  Jaclyn Fields   DOB:  1970/12/25   MRN:  [id number]   Chief Complaint: Allergic Rhinitis , Hypertension, and fever blisters  Hypertension This is a chronic problem. The current episode started more than 1 year ago. The problem has been gradually improving since onset. The problem is controlled. Pertinent negatives include no anxiety, blurred vision, chest pain, headaches, malaise/fatigue, neck pain, orthopnea, palpitations, peripheral edema, PND, shortness of breath or sweats. There are no associated agents to hypertension. There are no known risk factors for coronary artery disease. Past treatments include beta blockers and diuretics. The current treatment provides moderate improvement. There are no compliance problems.  There is no history of angina, kidney disease, CAD/MI, CVA, heart failure, left ventricular hypertrophy, PVD or retinopathy. There is no history of chronic renal disease, a hypertension causing med or renovascular disease.  URI  This is a chronic (for allergic rhinitis) problem. The current episode started more than 1 year ago. The problem has been gradually improving. There has been no fever. Pertinent negatives include no abdominal pain, chest pain, coughing, diarrhea, dysuria, ear pain, headaches, joint pain, nausea, neck pain, rash, rhinorrhea, sneezing, sore throat or wheezing. Treatments tried: singulair. The treatment provided mild relief.  Asthma There is no chest tightness, cough, difficulty breathing, frequent throat clearing, hemoptysis, hoarse voice, shortness of breath, sputum production or wheezing. This is a chronic problem. The current episode started more than 1 year ago. The problem occurs intermittently. The problem has been gradually improving. Associated symptoms include nasal congestion and postnasal drip. Pertinent negatives include no appetite change, chest pain, dyspnea on exertion, ear congestion, ear pain, fever,  headaches, heartburn, malaise/fatigue, myalgias, orthopnea, PND, rhinorrhea, sneezing, sore throat, sweats, trouble swallowing or weight loss. Her past medical history is significant for asthma.   Lab Results  Component Value Date   CREATININE 0.85 04/27/2020   BUN 20 04/27/2020   NA 141 04/27/2020   K 3.6 04/27/2020   CL 105 04/27/2020   CO2 25 04/27/2020   Lab Results  Component Value Date   CHOL 182 01/21/2020   HDL 53 01/21/2020   LDLCALC 112 (H) 01/21/2020   TRIG 93 01/21/2020   CHOLHDL 3.2 04/12/2016   Lab Results  Component Value Date   TSH 1.73 03/03/2020   No results found for: HGBA1C Lab Results  Component Value Date   WBC 11.9 (H) 07/24/2020   HGB 11.6 (L) 07/24/2020   HCT 36.5 07/24/2020   MCV 85.9 07/24/2020   PLT 494 (H) 07/24/2020   Lab Results  Component Value Date   ALT 14 01/21/2020   AST 18 01/21/2020   ALKPHOS 106 01/21/2020   BILITOT 0.3 01/21/2020     Review of Systems  Constitutional:  Negative for appetite change, chills, fever, malaise/fatigue and weight loss.  HENT:  Positive for postnasal drip. Negative for drooling, ear discharge, ear pain, hoarse voice, rhinorrhea, sneezing, sore throat and trouble swallowing.   Eyes:  Negative for blurred vision.  Respiratory:  Negative for cough, hemoptysis, sputum production, shortness of breath and wheezing.   Cardiovascular:  Negative for chest pain, dyspnea on exertion, palpitations, orthopnea, leg swelling and PND.  Gastrointestinal:  Negative for abdominal pain, blood in stool, constipation, diarrhea, heartburn and nausea.  Endocrine: Negative for polydipsia.  Genitourinary:  Negative for dysuria, frequency, hematuria and urgency.  Musculoskeletal:  Negative for back pain, joint pain, myalgias and neck pain.  Skin:  Negative for rash.  Allergic/Immunologic: Negative for environmental allergies.  Neurological:  Negative for dizziness and headaches.  Hematological:  Does not bruise/bleed easily.   Psychiatric/Behavioral:  Negative for suicidal ideas. The patient is not nervous/anxious.    Patient Active Problem List   Diagnosis Date Noted   Postoperative state 05/08/2020   Post-operative state 05/08/2020   Essential hypertension 01/02/2018   Transfusion history 08/12/2012   Postpartum hemorrhage 09/05/2011   PP care - s/p 1C/S 3/1 (breech, [facility]) 08/24/2011   Maternal iron deficiency anemia 08/24/2011   Benign essential hypertension complicating pregnancy, childbirth, and the puerperium 08/23/2011    Allergies  Allergen Reactions   Sulfa Antibiotics Swelling and Rash    SWELLING TO FINGERS    Past Surgical History:  Procedure Laterality Date   AUGMENTATION MAMMAPLASTY Bilateral 2005   BARTHOLIN CYST MARSUPIALIZATION Left 05/08/2020   Procedure: BARTHOLIN CYST MARSUPIALIZATION;  Surgeon: [name], MD;  Location: [place];  Service: Gynecology;  Laterality: Left;   CESAREAN SECTION     COLONOSCOPY  2018   DILATION AND EVACUATION  09/05/2011   Procedure: DILATATION AND EVACUATION;  Surgeon: Elveria Royals, MD;  Location: [facility];  Service: Gynecology;  Laterality: Bilateral;   ROBOTIC ASSISTED SUPRACERVICAL HYSTERECTOMY WITH BILATERAL SALPINGO OOPHERECTOMY Bilateral 05/08/2020   Procedure: XI ROBOTIC ASSISTED SUPRACERVICAL HYSTERECTOMY WITH BILATERAL SALPINGECTOMY;  Surgeon: [name], MD;  Location: [place];  Service: Gynecology;  Laterality: Bilateral;   TUBAL LIGATION     WISDOM TOOTH EXTRACTION      Social History   Tobacco Use   Smoking status: Never   Smokeless tobacco: Never  Vaping Use   Vaping Use: Never used  Substance Use Topics   Alcohol use: Yes    Comment: occasionally   Drug use: No     Medication list has been reviewed and updated.  Current Meds  Medication Sig   albuterol (VENTOLIN HFA) 108 (90 Base) MCG/ACT inhaler Inhale 2 puffs into the lungs every 6 (six) hours as needed for wheezing  or shortness of breath.   Ascorbic Acid (VITAMIN C GUMMIE PO) Take 1 tablet by mouth daily.   bimatoprost (LATISSE) 0.03 % ophthalmic solution Place 1 application into both eyes at bedtime. Place one drop on applicator and apply evenly along the skin of the upper eyelid at base of eyelashes once daily at bedtime; repeat procedure for second eye (use a clean applicator).   Biotin w/ Vitamins C & E (HAIR/SKIN/NAILS PO) Take 2 tablets by mouth daily.   cetirizine (ZYRTEC) 10 MG tablet Take 1 tablet (10 mg total) by mouth daily. (Patient taking differently: Take 10 mg by mouth at bedtime.)   cholecalciferol (VITAMIN D3) 25 MCG (1000 UNIT) tablet Take 1,000 Units by mouth daily.   fluticasone (FLONASE) 50 MCG/ACT nasal spray SPRAY 2 SPRAYS INTO EACH NOSTRIL EVERY DAY (Patient taking differently: Place 2 sprays into both nostrils at bedtime.)   hydrochlorothiazide (HYDRODIURIL) 25 MG tablet Take 1 tablet (25 mg total) by mouth daily.   ibuprofen (ADVIL) 200 MG tablet Take 400-800 mg by mouth every 6 (six) hours as needed for moderate pain.   metoprolol succinate (TOPROL-XL) 100 MG 24 hr tablet TAKE 1 TABLET BY MOUTH DAILY. TAKE WITH OR IMMEDIATELY FOLLOWING A MEAL.   montelukast (SINGULAIR) 10 MG tablet TAKE 1 TABLET BY MOUTH EVERYDAY AT BEDTIME   Multiple Vitamins-Minerals (MULTIVITAMIN GUMMIES WOMENS PO) Take 2 tablets by mouth daily.   Omega-3 Fatty Acids (FISH OIL PO) Take 1 capsule by  mouth daily.   valACYclovir (VALTREX) 1000 MG tablet Take 1 tablet (1,000 mg total) by mouth 2 (two) times daily.    PHQ 2/9 Scores 12/22/2020 01/21/2020 11/30/2019 01/08/2019  PHQ - 2 Score 0 0 0 0  PHQ- 9 Score 0 0 0 -    GAD 7 : Generalized Anxiety Score 12/22/2020 01/21/2020 11/30/2019  Nervous, Anxious, on Edge 0 0 0  Control/stop worrying 0 0 0  Worry too much - different things 0 0 0  Trouble relaxing 0 0 0  Restless 0 0 0  Easily annoyed or irritable 0 0 0  Afraid - awful might happen 0 0 0  Total GAD 7 Score 0  0 0    BP Readings from Last 3 Encounters:  12/22/20 130/70  09/29/20 (!) 142/88  07/24/20 (!) 130/92    Physical Exam Vitals and nursing note reviewed.  Constitutional:      General: She is not in acute distress.    Appearance: She is not diaphoretic.  HENT:     Head: Normocephalic and atraumatic.     Right Ear: Tympanic membrane, ear canal and external ear normal.     Left Ear: Tympanic membrane, ear canal and external ear normal.     Nose: Nose normal. No congestion or rhinorrhea.  Eyes:     General:        Right eye: No discharge.        Left eye: No discharge.     Conjunctiva/sclera: Conjunctivae normal.     Pupils: Pupils are equal, round, and reactive to light.  Neck:     Thyroid: No thyromegaly.     Vascular: No JVD.  Cardiovascular:     Rate and Rhythm: Normal rate and regular rhythm.     Heart sounds: Normal heart sounds. No murmur heard.   No friction rub. No gallop.  Pulmonary:     Effort: Pulmonary effort is normal.     Breath sounds: Normal breath sounds. No wheezing, rhonchi or rales.  Abdominal:     General: Bowel sounds are normal.     Palpations: Abdomen is soft. There is no mass.     Tenderness: There is no abdominal tenderness. There is no guarding.  Musculoskeletal:        General: Normal range of motion.     Cervical back: Normal range of motion and neck supple.  Lymphadenopathy:     Cervical: No cervical adenopathy.  Skin:    General: Skin is warm and dry.  Neurological:     Mental Status: She is alert.     Deep Tendon Reflexes: Reflexes are normal and symmetric.    Wt Readings from Last 3 Encounters:  12/22/20 215 lb (97.5 kg)  07/24/20 216 lb (98 kg)  07/07/20 220 lb (99.8 kg)    BP 130/70   Pulse 76   Ht 5\' 3"  (1.6 m)   Wt 215 lb (97.5 kg)   LMP 04/27/2020 Comment: Supracervical   BMI 38.09 kg/m   Assessment and Plan:  1. Essential hypertension Chronic.  Controlled.  Stable.  Blood pressure 130/70.  Continue metoprolol XL  100 mg once a day and hydrochlorothiazide 25 mg once a day.  Will check renal function panel. - hydrochlorothiazide (HYDRODIURIL) 25 MG tablet; Take 1 tablet (25 mg total) by mouth daily.  Dispense: 90 tablet; Refill: 1 - metoprolol succinate (TOPROL-XL) 100 MG 24 hr tablet; TAKE 1 TABLET BY MOUTH DAILY. TAKE WITH OR IMMEDIATELY FOLLOWING A MEAL.  Dispense:  90 tablet; Refill: 1 - Renal Function Panel  2. Post-COVID chronic cough Chronic.  Controlled.  Stable.  Patient is doing much better on Singulair and albuterol. - montelukast (SINGULAIR) 10 MG tablet; TAKE 1 TABLET BY MOUTH EVERYDAY AT BEDTIME  Dispense: 90 tablet; Refill: 1  3. Seasonal allergic rhinitis due to pollen Chronic.  Controlled.  Stable.  Continue Flonase 2 sprays in each nostril once a day. - fluticasone (FLONASE) 50 MCG/ACT nasal spray; SPRAY 2 SPRAYS INTO EACH NOSTRIL EVERY DAY  Dispense: 48 mL; Refill: 11  4. Chronic maxillary sinusitis Chronic.  Controlled.  Stable.  Continue Singulair 10 mg once a day. - montelukast (SINGULAIR) 10 MG tablet; TAKE 1 TABLET BY MOUTH EVERYDAY AT BEDTIME  Dispense: 90 tablet; Refill: 1  5. HSV-1 (herpes simplex virus 1) infection Chronic.  Controlled.  Stable.  When symptomatic takes 1 g valacyclovir twice a day as needed. - valACYclovir (VALTREX) 1000 MG tablet; Take 1 tablet (1,000 mg total) by mouth 2 (two) times daily.  Dispense: 10 tablet; Refill: 0  6. Familial hypercholesterolemia Chronic.  Controlled.  Stable.  Patient is currently controlling with diet we will check lipid panel. - Lipid Panel With LDL/HDL Ratio  7. Mild intermittent reactive airway disease without complication Patient will continue Ventolin 2 puffs every 6 hours as needed for wheezing or shortness of breath or an in her case persistent cough. - albuterol (VENTOLIN HFA) 108 (90 Base) MCG/ACT inhaler; Inhale 2 puffs into the lungs every 6 (six) hours as needed for wheezing or shortness of breath.  Dispense: 8 g;  Refill: 2

## 2020-12-23 LAB — RENAL FUNCTION PANEL
Albumin: 4.1 g/dL (ref 3.8–4.8)
BUN/Creatinine Ratio: 18 (ref 9–23)
BUN: 17 mg/dL (ref 6–24)
CO2: 25 mmol/L (ref 20–29)
Calcium: 9.6 mg/dL (ref 8.7–10.2)
Chloride: 99 mmol/L (ref 96–106)
Creatinine, Ser: 0.95 mg/dL (ref 0.57–1.00)
Glucose: 99 mg/dL (ref 65–99)
Phosphorus: 3.4 mg/dL (ref 3.0–4.3)
Potassium: 4.5 mmol/L (ref 3.5–5.2)
Sodium: 137 mmol/L (ref 134–144)
eGFR: 73 mL/min/{1.73_m2} (ref >59)

## 2020-12-23 LAB — LIPID PANEL WITH LDL/HDL RATIO
Cholesterol, Total: 197 mg/dL (ref 100–199)
HDL: 52 mg/dL (ref >39)
LDL Chol Calc (NIH): 123 mg/dL — ABNORMAL HIGH (ref 0–99)
LDL/HDL Ratio: 2.4 ratio (ref 0.0–3.2)
Triglycerides: 123 mg/dL (ref 0–149)
VLDL Cholesterol Cal: 22 mg/dL (ref 5–40)

## 2021-02-28 ENCOUNTER — Other Ambulatory Visit: Payer: Self-pay

## 2021-02-28 ENCOUNTER — Telehealth: Payer: Self-pay

## 2021-02-28 ENCOUNTER — Encounter: Payer: Self-pay | Admitting: Obstetrics & Gynecology

## 2021-02-28 ENCOUNTER — Ambulatory Visit (INDEPENDENT_AMBULATORY_CARE_PROVIDER_SITE_OTHER): Payer: BC Managed Care – PPO | Admitting: Obstetrics & Gynecology

## 2021-02-28 VITALS — BP 114/70

## 2021-02-28 DIAGNOSIS — N644 Mastodynia: Secondary | ICD-10-CM

## 2021-02-28 NOTE — Telephone Encounter (Signed)
[name], MD  [facility] Triage   Bilateral breast tenderness.  No mass felt, no erythema.  S/P bilateral implants.

## 2021-02-28 NOTE — Telephone Encounter (Signed)
Order placed. I spoke with Tanika at [facility] and scheduled her for Tuesday Oct 11 at 2:20pm.

## 2021-02-28 NOTE — Telephone Encounter (Signed)
I spoke with patient and informed her of date/time and that she can call daily for cancellation. Phone number and address for [facility] provided.

## 2021-02-28 NOTE — Progress Notes (Signed)
    ANDRESSA VESTER 16-Dec-1970 [id number]        50 y.o.  G1P1001   RP: Bilateral breast tenderness around the nipple areas  HPI: Bilateral breast tenderness around the nipple areas.  No nipple d/c.  No lump felt.  No redness.  No fever.  No recent mammogram.  No first degree relative with breast Ca.   OB History  Gravida Para Term Preterm AB Living  '1 1 1   '$ 0 1  SAB IAB Ectopic Multiple Live Births    0     1    # Outcome Date GA Lbr Len/2nd Weight Sex Delivery Anes PTL Lv  1 Term 08/23/11 [redacted]w[redacted]d 6 lb 10.9 oz (3.03 kg) F CS-LTranv Spinal  LIV    Past medical history,surgical history, problem list, medications, allergies, family history and social history were all reviewed and documented in the EPIC chart.   Directed ROS with pertinent positives and negatives documented in the history of present illness/assessment and plan.  Exam:  Vitals:   02/28/21 1430  BP: 114/70   General appearance:  Normal  Breast exam:  Tender breasts centrally, bilaterally.  No erythema.  No nodule or mass felt.  No axillary LN felt.   Assessment/Plan:  50y.o. G1P1001   1. Breast pain in female  Bilateral breast tenderness centrally.  Breast exam normal otherwise bilaterally.  No recent mammogram done.  No first degree relative with h/o Breast Ca.  Decision to proceed with bilateral diagnostic mammogram with breast ultrasound.    MPrincess BruinsMD, 2:50 PM 02/28/2021

## 2021-03-02 ENCOUNTER — Encounter: Payer: Self-pay | Admitting: Obstetrics & Gynecology

## 2021-03-15 ENCOUNTER — Encounter: Payer: Self-pay | Admitting: Family Medicine

## 2021-03-15 ENCOUNTER — Other Ambulatory Visit: Payer: Self-pay

## 2021-03-15 ENCOUNTER — Ambulatory Visit (INDEPENDENT_AMBULATORY_CARE_PROVIDER_SITE_OTHER): Payer: BC Managed Care – PPO | Admitting: Family Medicine

## 2021-03-15 VITALS — BP 138/100 | HR 80 | Ht 63.0 in | Wt 224.0 lb

## 2021-03-15 DIAGNOSIS — J0100 Acute maxillary sinusitis, unspecified: Secondary | ICD-10-CM | POA: Diagnosis not present

## 2021-03-15 MED ORDER — AMOXICILLIN 500 MG PO CAPS: 500.0000 mg | ORAL_CAPSULE | Freq: Three times a day (TID) | ORAL | 0 refills | Status: DC

## 2021-03-15 MED ORDER — AMOXICILLIN 500 MG PO CAPS: 500.0000 mg | ORAL_CAPSULE | Freq: Three times a day (TID) | ORAL | 0 refills | Status: AC

## 2021-03-15 NOTE — Progress Notes (Signed)
Date:  03/15/2021   Name:  Jaclyn Fields   DOB:  06-Jan-1971   MRN:  [id number]   Chief Complaint: Cough (Cough x 1 month getting worse. Has headache with it. COVID this am was negative. )  Cough This is a chronic problem. The current episode started more than 1 year ago. The problem has been waxing and waning. The problem occurs every few minutes. The cough is Productive of purulent sputum. Associated symptoms include chills, headaches, nasal congestion, postnasal drip, rhinorrhea and a sore throat. Pertinent negatives include no chest pain, ear congestion, ear pain, fever, hemoptysis, myalgias, rash, shortness of breath, sweats or wheezing. Treatments tried: delsym  with honey. The treatment provided mild relief. There is no history of environmental allergies.  Sinusitis This is a new problem. The current episode started in the past 7 days. The problem has been waxing and waning since onset. There has been no fever. The pain is mild. Associated symptoms include chills, congestion, coughing, headaches, sinus pressure, sneezing and a sore throat. Pertinent negatives include no ear pain, neck pain or shortness of breath.   Lab Results  Component Value Date   CREATININE 0.95 12/22/2020   BUN 17 12/22/2020   NA 137 12/22/2020   K 4.5 12/22/2020   CL 99 12/22/2020   CO2 25 12/22/2020   Lab Results  Component Value Date   CHOL 197 12/22/2020   HDL 52 12/22/2020   LDLCALC 123 (H) 12/22/2020   TRIG 123 12/22/2020   CHOLHDL 3.2 04/12/2016   Lab Results  Component Value Date   TSH 1.73 03/03/2020   No results found for: HGBA1C Lab Results  Component Value Date   WBC 11.9 (H) 07/24/2020   HGB 11.6 (L) 07/24/2020   HCT 36.5 07/24/2020   MCV 85.9 07/24/2020   PLT 494 (H) 07/24/2020   Lab Results  Component Value Date   ALT 14 01/21/2020   AST 18 01/21/2020   ALKPHOS 106 01/21/2020   BILITOT 0.3 01/21/2020     Review of Systems  Constitutional:  Positive for chills.  Negative for fever.  HENT:  Positive for congestion, postnasal drip, rhinorrhea, sinus pressure, sneezing and sore throat. Negative for drooling, ear discharge and ear pain.   Respiratory:  Positive for cough. Negative for hemoptysis, shortness of breath and wheezing.   Cardiovascular:  Negative for chest pain, palpitations and leg swelling.  Gastrointestinal:  Negative for abdominal pain, blood in stool, constipation, diarrhea and nausea.  Endocrine: Negative for polydipsia.  Genitourinary:  Negative for dysuria, frequency, hematuria and urgency.  Musculoskeletal:  Negative for back pain, myalgias and neck pain.  Skin:  Negative for rash.  Allergic/Immunologic: Negative for environmental allergies.  Neurological:  Positive for headaches. Negative for dizziness.  Hematological:  Does not bruise/bleed easily.  Psychiatric/Behavioral:  Negative for suicidal ideas. The patient is not nervous/anxious.    Patient Active Problem List   Diagnosis Date Noted   Postoperative state 05/08/2020   Post-operative state 05/08/2020   Essential hypertension 01/02/2018   Transfusion history 08/12/2012   Postpartum hemorrhage 09/05/2011   PP care - s/p 1C/S 3/1 (breech, [facility]) 08/24/2011   Maternal iron deficiency anemia 08/24/2011   Benign essential hypertension complicating pregnancy, childbirth, and the puerperium 08/23/2011    Allergies  Allergen Reactions   Sulfa Antibiotics Swelling and Rash    SWELLING TO FINGERS    Past Surgical History:  Procedure Laterality Date   AUGMENTATION MAMMAPLASTY Bilateral 2005   BARTHOLIN CYST  MARSUPIALIZATION Left 05/08/2020   Procedure: BARTHOLIN CYST MARSUPIALIZATION;  Surgeon: [name], MD;  Location: [facility];  Service: Gynecology;  Laterality: Left;   CESAREAN SECTION     COLONOSCOPY  2018   DILATION AND EVACUATION  09/05/2011   Procedure: DILATATION AND EVACUATION;  Surgeon: Elveria Royals, MD;  Location: [facility];  Service:  Gynecology;  Laterality: Bilateral;   ROBOTIC ASSISTED SUPRACERVICAL HYSTERECTOMY WITH BILATERAL SALPINGO OOPHERECTOMY Bilateral 05/08/2020   Procedure: XI ROBOTIC ASSISTED SUPRACERVICAL HYSTERECTOMY WITH BILATERAL SALPINGECTOMY;  Surgeon: [name], MD;  Location: [place];  Service: Gynecology;  Laterality: Bilateral;   TUBAL LIGATION     WISDOM TOOTH EXTRACTION      Social History   Tobacco Use   Smoking status: Never   Smokeless tobacco: Never  Vaping Use   Vaping Use: Never used  Substance Use Topics   Alcohol use: Yes    Comment: occasionally   Drug use: No     Medication list has been reviewed and updated.  Current Meds  Medication Sig   albuterol (VENTOLIN HFA) 108 (90 Base) MCG/ACT inhaler Inhale 2 puffs into the lungs every 6 (six) hours as needed for wheezing or shortness of breath.   Ascorbic Acid (VITAMIN C GUMMIE PO) Take 1 tablet by mouth daily.   bimatoprost (LATISSE) 0.03 % ophthalmic solution Place 1 application into both eyes at bedtime. Place one drop on applicator and apply evenly along the skin of the upper eyelid at base of eyelashes once daily at bedtime; repeat procedure for second eye (use a clean applicator).   Biotin w/ Vitamins C & E (HAIR/SKIN/NAILS PO) Take 2 tablets by mouth daily.   cetirizine (ZYRTEC) 10 MG tablet Take 1 tablet (10 mg total) by mouth daily. (Patient taking differently: Take 10 mg by mouth at bedtime.)   cholecalciferol (VITAMIN D3) 25 MCG (1000 UNIT) tablet Take 1,000 Units by mouth daily.   fluticasone (FLONASE) 50 MCG/ACT nasal spray SPRAY 2 SPRAYS INTO EACH NOSTRIL EVERY DAY   hydrochlorothiazide (HYDRODIURIL) 25 MG tablet Take 1 tablet (25 mg total) by mouth daily.   ibuprofen (ADVIL) 200 MG tablet Take 400-800 mg by mouth every 6 (six) hours as needed for moderate pain.   metoprolol succinate (TOPROL-XL) 100 MG 24 hr tablet TAKE 1 TABLET BY MOUTH DAILY. TAKE WITH OR IMMEDIATELY FOLLOWING A MEAL.    montelukast (SINGULAIR) 10 MG tablet TAKE 1 TABLET BY MOUTH EVERYDAY AT BEDTIME   Multiple Vitamins-Minerals (MULTIVITAMIN GUMMIES WOMENS PO) Take 2 tablets by mouth daily.   Omega-3 Fatty Acids (FISH OIL PO) Take 1 capsule by mouth daily.   valACYclovir (VALTREX) 1000 MG tablet Take 1 tablet (1,000 mg total) by mouth 2 (two) times daily.    PHQ 2/9 Scores 12/22/2020 01/21/2020 11/30/2019 01/08/2019  PHQ - 2 Score 0 0 0 0  PHQ- 9 Score 0 0 0 -    GAD 7 : Generalized Anxiety Score 12/22/2020 01/21/2020 11/30/2019  Nervous, Anxious, on Edge 0 0 0  Control/stop worrying 0 0 0  Worry too much - different things 0 0 0  Trouble relaxing 0 0 0  Restless 0 0 0  Easily annoyed or irritable 0 0 0  Afraid - awful might happen 0 0 0  Total GAD 7 Score 0 0 0    BP Readings from Last 3 Encounters:  03/15/21 (!) 138/100  02/28/21 114/70  12/22/20 130/70    Physical Exam Vitals and nursing note reviewed.  HENT:     Right Ear: Tympanic membrane normal.     Left Ear: Tympanic membrane normal.     Nose:     Right Turbinates: Swollen.     Left Turbinates: Swollen.     Right Sinus: Maxillary sinus tenderness present. No frontal sinus tenderness.     Left Sinus: Maxillary sinus tenderness present. No frontal sinus tenderness.  Cardiovascular:     Heart sounds: S1 normal.  No systolic murmur is present.  No diastolic murmur is present.    No S3 or S4 sounds.  Pulmonary:     Breath sounds: No decreased breath sounds, wheezing, rhonchi or rales.  Musculoskeletal:     Cervical back: Neck supple.  Lymphadenopathy:     Cervical: No cervical adenopathy.    Wt Readings from Last 3 Encounters:  03/15/21 224 lb (101.6 kg)  12/22/20 215 lb (97.5 kg)  07/24/20 216 lb (98 kg)    BP (!) 138/100   Pulse 80   Ht 5\' 3"  (1.6 m)   Wt 224 lb (101.6 kg)   LMP 04/27/2020 Comment: Supracervical   BMI 39.68 kg/m   Assessment and Plan:  1. Acute maxillary sinusitis, recurrence not specified Acute.   Uncomplicated.  Patient presents with cough my exam is consistent with an acute sinusitis of both maxillary sinuses.  We will treat with amoxicillin 500 mg 3 times a day for 10 days. - amoxicillin (AMOXIL) 500 MG capsule; Take 1 capsule (500 mg total) by mouth 3 (three) times daily for 10 days.  Dispense: 30 capsule; Refill: 0

## 2021-04-03 ENCOUNTER — Ambulatory Visit
Admission: RE | Admit: 2021-04-03 | Discharge: 2021-04-03 | Disposition: A | Discharge status: Home / Self Care | Payer: BC Managed Care – PPO | Source: Ambulatory Visit | Attending: Obstetrics & Gynecology | Admitting: Obstetrics & Gynecology

## 2021-04-03 DIAGNOSIS — N644 Mastodynia: Secondary | ICD-10-CM

## 2021-04-03 DIAGNOSIS — R922 Inconclusive mammogram: Secondary | ICD-10-CM | POA: Diagnosis not present

## 2021-04-27 ENCOUNTER — Encounter: Payer: Self-pay | Admitting: Obstetrics & Gynecology

## 2021-04-27 ENCOUNTER — Other Ambulatory Visit: Payer: Self-pay

## 2021-04-27 ENCOUNTER — Ambulatory Visit (INDEPENDENT_AMBULATORY_CARE_PROVIDER_SITE_OTHER): Payer: BC Managed Care – PPO | Admitting: Obstetrics & Gynecology

## 2021-04-27 VITALS — BP 140/100 | HR 72 | Temp 99.6°F | Resp 16 | Wt 223.0 lb

## 2021-04-27 DIAGNOSIS — R350 Frequency of micturition: Secondary | ICD-10-CM | POA: Diagnosis not present

## 2021-04-27 DIAGNOSIS — Z6839 Body mass index (BMI) 39.0-39.9, adult: Secondary | ICD-10-CM | POA: Diagnosis not present

## 2021-04-27 MED ORDER — NITROFURANTOIN MONOHYD MACRO 100 MG PO CAPS: 100.0000 mg | ORAL_CAPSULE | Freq: Two times a day (BID) | ORAL | 2 refills | Status: DC

## 2021-04-27 MED ORDER — CIPROFLOXACIN HCL 500 MG PO TABS: 500.0000 mg | ORAL_TABLET | Freq: Two times a day (BID) | ORAL | 0 refills | Status: AC

## 2021-04-27 MED ORDER — FLUCONAZOLE 150 MG PO TABS: 150.0000 mg | ORAL_TABLET | Freq: Once | ORAL | 2 refills | Status: AC

## 2021-04-27 NOTE — Progress Notes (Signed)
    Jaclyn Fields May 30, 1971 [id number]        50 y.o.  G1P1001   RP: Urinary frequency/struggling to loose weight  HPI: Urinary frequency.  No blood in urine.  No fever.  Last Cystitis was in 09/2020.  Tends to have UTI after IC.  BMI 39.5.  Struggling to loose weight.   OB History  Gravida Para Term Preterm AB Living  1 1 1    0 1  SAB IAB Ectopic Multiple Live Births    0     1    # Outcome Date GA Lbr Len/2nd Weight Sex Delivery Anes PTL Lv  1 Term 08/23/11 [redacted]w[redacted]d  6 lb 10.9 oz (3.03 kg) F CS-LTranv Spinal  LIV    Past medical history,surgical history, problem list, medications, allergies, family history and social history were all reviewed and documented in the EPIC chart.   Directed ROS with pertinent positives and negatives documented in the history of present illness/assessment and plan.  Exam:  Vitals:   04/27/21 1530  BP: (!) 140/100  Pulse: 72  Resp: 16  Temp: 99.6 F (37.6 C)  TempSrc: Oral  Weight: 223 lb (101.2 kg)   General appearance:  Normal  CVAT Neg Bilaterally  Abdomen: Normal  Gynecologic exam: Deferred  U/A: Yellow slightly cloudy, protein 2+, nitrites positive, white blood cells 20-40, red blood cells 0-2, bacteria many.  Urine culture pending.   Assessment/Plan:  50 y.o. G1P1001   1. Urinary frequency Abnormal urine analysis with urinary frequency.  Decision to treat with Cipro 500 mg per mouth twice a day for 7 days.  Usage reviewed and prescription sent to pharmacy.  Will use fluconazole to prevent or treat yeast vaginitis post antibiotics. - Urinalysis,Complete w/RFL Culture  2. Class 2 severe obesity due to excess calories with serious comorbidity and body mass index (BMI) of 39.0 to 39.9 in adult [facility]) Recommend a lower calorie/carb diet.  Intermittent fasting discussed.  Aerobic activities 5 times a week and light weightlifting every 2 days.  Other orders - ciprofloxacin (CIPRO) 500 MG tablet; Take 1 tablet (500 mg total) by mouth  2 (two) times daily for 7 days. - fluconazole (DIFLUCAN) 150 MG tablet; Take 1 tablet (150 mg total) by mouth once for 1 dose.  - nitrofurantoin, macrocrystal-monohydrate, (MACROBID) 100 MG capsule; Take 1 capsule (100 mg total) by mouth 2 (two) times daily. 1 caps PO before sexual activity as prophylaxis.   [name] MD, 3:59 PM 04/27/2021

## 2021-04-30 LAB — URINE CULTURE
MICRO NUMBER:: 12595745
SPECIMEN QUALITY:: ADEQUATE

## 2021-04-30 LAB — URINALYSIS, COMPLETE W/RFL CULTURE
Bilirubin Urine: NEGATIVE
Glucose, UA: NEGATIVE
Hgb urine dipstick: NEGATIVE
Ketones, ur: NEGATIVE
Nitrites, Initial: POSITIVE — AB
Specific Gravity, Urine: 1.02 (ref 1.001–1.035)
pH: 6 (ref 5.0–8.0)

## 2021-04-30 LAB — CULTURE INDICATED

## 2021-05-06 ENCOUNTER — Encounter: Payer: Self-pay | Admitting: Obstetrics & Gynecology

## 2021-05-08 ENCOUNTER — Other Ambulatory Visit: Payer: Self-pay

## 2021-05-08 ENCOUNTER — Encounter: Payer: Self-pay | Admitting: Obstetrics & Gynecology

## 2021-05-08 ENCOUNTER — Ambulatory Visit (INDEPENDENT_AMBULATORY_CARE_PROVIDER_SITE_OTHER): Payer: BC Managed Care – PPO | Admitting: Obstetrics & Gynecology

## 2021-05-08 VITALS — BP 128/82 | HR 81 | Resp 16 | Ht 63.25 in | Wt 220.0 lb

## 2021-05-08 DIAGNOSIS — E6609 Other obesity due to excess calories: Secondary | ICD-10-CM

## 2021-05-08 DIAGNOSIS — Z6838 Body mass index (BMI) 38.0-38.9, adult: Secondary | ICD-10-CM

## 2021-05-08 DIAGNOSIS — Z01419 Encounter for gynecological examination (general) (routine) without abnormal findings: Secondary | ICD-10-CM

## 2021-05-08 DIAGNOSIS — Z90711 Acquired absence of uterus with remaining cervical stump: Secondary | ICD-10-CM | POA: Diagnosis not present

## 2021-05-08 DIAGNOSIS — Z87440 Personal history of urinary (tract) infections: Secondary | ICD-10-CM | POA: Diagnosis not present

## 2021-05-08 NOTE — Progress Notes (Signed)
Jaclyn Fields 01-02-1971 [id number]   History:    50 y.o. G2P1A1L1 Married.  Daughter is 50 yo Dancing/great reader.   RP:  Established patient presenting for annual gyn exam   HPI:  S/P XI Robotic Supracervical Hysterectomy with Bilateral Salpingectomy with extensive lysis of adhesions and Lt Bartholin Gland Marsupialization on 05/08/2020.  Patho benign.  Pap Neg 04/2020. Urine currently normal, but had an Acute Cystitis to E. Coli on 04/27/2021.  BMs normal.  Breasts normal.  Screening mammo Neg 03/2021.  BMI 38.66. Managing with the [facility].  Received HCG yesterday and started on a 1200 cal/day diet with fitness activities daily.  Health labs with Fam MD. [name] 2018.   Past medical history,surgical history, family history and social history were all reviewed and documented in the EPIC chart.  Gynecologic History Patient's last menstrual period was 04/27/2020.  Obstetric History OB History  Gravida Para Term Preterm AB Living  1 1 1    0 1  SAB IAB Ectopic Multiple Live Births    0     1    # Outcome Date GA Lbr Len/2nd Weight Sex Delivery Anes PTL Lv  1 Term 08/23/11 [redacted]w[redacted]d  6 lb 10.9 oz (3.03 kg) F CS-LTranv Spinal  LIV     ROS: A ROS was performed and pertinent positives and negatives are included in the history.  GENERAL: No fevers or chills. HEENT: No change in vision, no earache, sore throat or sinus congestion. NECK: No pain or stiffness. CARDIOVASCULAR: No chest pain or pressure. No palpitations. PULMONARY: No shortness of breath, cough or wheeze. GASTROINTESTINAL: No abdominal pain, nausea, vomiting or diarrhea, melena or bright red blood per rectum. GENITOURINARY: No urinary frequency, urgency, hesitancy or dysuria. MUSCULOSKELETAL: No joint or muscle pain, no back pain, no recent trauma. DERMATOLOGIC: No rash, no itching, no lesions. ENDOCRINE: No polyuria, polydipsia, no heat or cold intolerance. No recent change in weight. HEMATOLOGICAL: No anemia or easy  bruising or bleeding. NEUROLOGIC: No headache, seizures, numbness, tingling or weakness. PSYCHIATRIC: No depression, no loss of interest in normal activity or change in sleep pattern.     Exam:   BP 128/82   Pulse 81   Resp 16   Ht 5' 3.25" (1.607 m)   Wt 220 lb (99.8 kg)   LMP 04/27/2020 Comment: Supracervical   BMI 38.66 kg/m   Body mass index is 38.66 kg/m.  General appearance : Well developed well nourished female. No acute distress HEENT: Eyes: no retinal hemorrhage or exudates,  Neck supple, trachea midline, no carotid bruits, no thyroidmegaly Lungs: Clear to auscultation, no rhonchi or wheezes, or rib retractions  Heart: Regular rate and rhythm, no murmurs or gallops Breast:Examined in sitting and supine position were symmetrical in appearance, no palpable masses or tenderness,  no skin retraction, no nipple inversion, no nipple discharge, no skin discoloration, no axillary or supraclavicular lymphadenopathy Abdomen: no palpable masses or tenderness, no rebound or guarding Extremities: no edema or skin discoloration or tenderness  Pelvic: Vulva: Normal             Vagina: No gross lesions or discharge  Cervix: No gross lesions or discharge  Uterus Absent  Adnexa  Without masses or tenderness  Anus: Normal  U/A: Yellow clear, protein 2+, nitrites negative, white blood cells 0-5, red blood cells negative, bacteria few.  Urine culture pending.   Assessment/Plan:  49 y.o. female for annual exam   1. Well female exam with routine gynecological  exam S/P XI Robotic Supracervical Hysterectomy with Bilateral Salpingectomy with extensive lysis of adhesions and Lt Bartholin Gland Marsupialization on 05/08/2020.  Patho benign.  Pap Neg 04/2020. Will repeat Pap at 2-3 yrs.  Urine currently normal, but had an Acute Cystitis to E. Coli on 04/27/2021.  BMs normal.  Breasts normal.  Screening mammo Neg 03/2021.  BMI 38.66. Managing with the [facility].  Received HCG yesterday  and started on a 1200 cal/day diet with fitness activities daily.  Health labs with Fam MD. [name] 2018, will schedule this year.  2. S/P laparoscopic supracervical hysterectomy  3. History of UTI Currently asymptomatic and very mild protuberance of urine analysis.  We will proceed with urine culture. - Urinalysis,Complete w/RFL Culture  4. Class 2 obesity due to excess calories without serious comorbidity with body mass index (BMI) of 38.0 to 38.9 in adult   BMI 38.66. Managing with the [facility].  Received HCG yesterday and started on a 1200 cal/day diet with fitness activities daily.    [name] MD, 9:40 AM 05/08/2021

## 2021-05-10 LAB — URINALYSIS, COMPLETE W/RFL CULTURE
Bilirubin Urine: NEGATIVE
Glucose, UA: NEGATIVE
Hyaline Cast: NONE SEEN /LPF
Ketones, ur: NEGATIVE
Leukocyte Esterase: NEGATIVE
Nitrites, Initial: NEGATIVE
RBC / HPF: NONE SEEN /HPF (ref 0–2)
Specific Gravity, Urine: 1.025 (ref 1.001–1.035)
pH: 5.5 (ref 5.0–8.0)

## 2021-05-10 LAB — CULTURE INDICATED

## 2021-05-10 LAB — URINE CULTURE
MICRO NUMBER:: 12638896
Result:: NO GROWTH
SPECIMEN QUALITY:: ADEQUATE

## 2021-06-05 ENCOUNTER — Other Ambulatory Visit: Payer: Self-pay

## 2021-06-05 ENCOUNTER — Ambulatory Visit
Admission: RE | Admit: 2021-06-05 | Discharge: 2021-06-05 | Disposition: A | Discharge status: Home / Self Care | Payer: BC Managed Care – PPO | Attending: Family Medicine | Admitting: Family Medicine

## 2021-06-05 ENCOUNTER — Encounter: Payer: Self-pay | Admitting: Family Medicine

## 2021-06-05 ENCOUNTER — Ambulatory Visit (INDEPENDENT_AMBULATORY_CARE_PROVIDER_SITE_OTHER): Payer: BC Managed Care – PPO | Admitting: Family Medicine

## 2021-06-05 ENCOUNTER — Ambulatory Visit
Admission: RE | Admit: 2021-06-05 | Discharge: 2021-06-05 | Disposition: A | Discharge status: Home / Self Care | Payer: BC Managed Care – PPO | Source: Ambulatory Visit | Attending: Family Medicine | Admitting: Family Medicine

## 2021-06-05 VITALS — BP 136/82 | HR 80 | Ht 63.5 in | Wt 217.0 lb

## 2021-06-05 DIAGNOSIS — J0101 Acute recurrent maxillary sinusitis: Secondary | ICD-10-CM | POA: Diagnosis not present

## 2021-06-05 DIAGNOSIS — L0100 Impetigo, unspecified: Secondary | ICD-10-CM

## 2021-06-05 MED ORDER — TRIAMCINOLONE ACETONIDE 55 MCG/ACT NA AERO: 2.0000 | INHALATION_SPRAY | Freq: Every day | NASAL | 12 refills | Status: DC

## 2021-06-05 MED ORDER — AMOXICILLIN-POT CLAVULANATE 875-125 MG PO TABS: 1.0000 | ORAL_TABLET | Freq: Two times a day (BID) | ORAL | 0 refills | Status: DC

## 2021-06-05 MED ORDER — MUPIROCIN CALCIUM 2 % NA OINT
1.0000 | TOPICAL_OINTMENT | Freq: Two times a day (BID) | NASAL | 0 refills | Status: AC
Start: 2021-06-05 — End: ?

## 2021-06-05 NOTE — Progress Notes (Signed)
Date:  06/05/2021   Name:  Jaclyn Fields   DOB:  [birth year]   MRN:  [id number]   Chief Complaint: Sinusitis (Started during Thanksgiving with a feeling something was in the L) nostril.Was able to get a blood clot out of nostril- now is scabbed up on inside of nose and L) side of face is swollen and ear is hurting. )  Sinusitis This is a new problem. The current episode started 1 to 4 weeks ago. The problem has been waxing and waning since onset. There has been no fever. The pain is moderate. Associated symptoms include congestion. Pertinent negatives include no chills, coughing, diaphoresis, ear pain, headaches, hoarse voice, neck pain, shortness of breath, sinus pressure, sneezing, sore throat or swollen glands. The treatment provided mild relief.   Lab Results  Component Value Date   NA 137 12/22/2020   K 4.5 12/22/2020   CO2 25 12/22/2020   GLUCOSE 99 12/22/2020   BUN 17 12/22/2020   CREATININE 0.95 12/22/2020   CALCIUM 9.6 12/22/2020   EGFR 73 12/22/2020   GFRNONAA >60 04/27/2020   Lab Results  Component Value Date   CHOL 197 12/22/2020   HDL 52 12/22/2020   LDLCALC 123 (H) 12/22/2020   TRIG 123 12/22/2020   CHOLHDL 3.2 04/12/2016   Lab Results  Component Value Date   TSH 1.73 03/03/2020   No results found for: HGBA1C Lab Results  Component Value Date   WBC 11.9 (H) 07/24/2020   HGB 11.6 (L) 07/24/2020   HCT 36.5 07/24/2020   MCV 85.9 07/24/2020   PLT 494 (H) 07/24/2020   Lab Results  Component Value Date   ALT 14 01/21/2020   AST 18 01/21/2020   ALKPHOS 106 01/21/2020   BILITOT 0.3 01/21/2020   Lab Results  Component Value Date   VD25OH 20 (L) 01/12/2019     Review of Systems  Constitutional:  Negative for chills, diaphoresis and fever.  HENT:  Positive for congestion. Negative for drooling, ear discharge, ear pain, hoarse voice, sinus pressure, sneezing and sore throat.   Respiratory:  Negative for cough, shortness of breath and wheezing.    Cardiovascular:  Negative for chest pain, palpitations and leg swelling.  Gastrointestinal:  Negative for abdominal pain, blood in stool, constipation, diarrhea and nausea.  Endocrine: Negative for polydipsia.  Genitourinary:  Negative for dysuria, frequency, hematuria and urgency.  Musculoskeletal:  Negative for back pain, myalgias and neck pain.  Skin:  Negative for rash.  Allergic/Immunologic: Negative for environmental allergies.  Neurological:  Negative for dizziness and headaches.  Hematological:  Does not bruise/bleed easily.  Psychiatric/Behavioral:  Negative for suicidal ideas. The patient is not nervous/anxious.    Patient Active Problem List   Diagnosis Date Noted   Postoperative state 05/08/2020   Post-operative state 05/08/2020   Essential hypertension 01/02/2018   Transfusion history 08/12/2012   Postpartum hemorrhage 09/05/2011   PP care - s/p 1C/S 3/1 (breech, [facility]) 08/24/2011   Maternal iron deficiency anemia 08/24/2011   Benign essential hypertension complicating pregnancy, childbirth, and the puerperium 08/23/2011    Allergies  Allergen Reactions   Sulfa Antibiotics Swelling and Rash    SWELLING TO FINGERS    Past Surgical History:  Procedure Laterality Date   AUGMENTATION MAMMAPLASTY Bilateral 2005   BARTHOLIN CYST MARSUPIALIZATION Left 05/08/2020   Procedure: BARTHOLIN CYST MARSUPIALIZATION;  Surgeon: [name], MD;  Location: [facility];  Service: Gynecology;  Laterality: Left;   CESAREAN SECTION  COLONOSCOPY  2018   DILATION AND EVACUATION  09/05/2011   Procedure: DILATATION AND EVACUATION;  Surgeon: Elveria Royals, MD;  Location: [facility];  Service: Gynecology;  Laterality: Bilateral;   ROBOTIC ASSISTED SUPRACERVICAL HYSTERECTOMY WITH BILATERAL SALPINGO OOPHERECTOMY Bilateral 05/08/2020   Procedure: XI ROBOTIC ASSISTED SUPRACERVICAL HYSTERECTOMY WITH BILATERAL SALPINGECTOMY;  Surgeon: [name], MD;  Location: [facility];  Service: Gynecology;  Laterality: Bilateral;   TUBAL LIGATION     WISDOM TOOTH EXTRACTION      Social History   Tobacco Use   Smoking status: Never   Smokeless tobacco: Never  Vaping Use   Vaping Use: Never used  Substance Use Topics   Alcohol use: Yes    Comment: occasionally   Drug use: No     Medication list has been reviewed and updated.  Current Meds  Medication Sig   albuterol (VENTOLIN HFA) 108 (90 Base) MCG/ACT inhaler Inhale 2 puffs into the lungs every 6 (six) hours as needed for wheezing or shortness of breath.   Ascorbic Acid (VITAMIN C GUMMIE PO) Take 1 tablet by mouth daily.   Biotin w/ Vitamins C & E (HAIR/SKIN/NAILS PO) Take 2 tablets by mouth daily.   cetirizine (ZYRTEC) 10 MG tablet Take 1 tablet (10 mg total) by mouth daily. (Patient taking differently: Take 10 mg by mouth at bedtime.)   cholecalciferol (VITAMIN D3) 25 MCG (1000 UNIT) tablet Take 1,000 Units by mouth daily.   fluticasone (FLONASE) 50 MCG/ACT nasal spray SPRAY 2 SPRAYS INTO EACH NOSTRIL EVERY DAY   hydrochlorothiazide (HYDRODIURIL) 25 MG tablet Take 1 tablet (25 mg total) by mouth daily.   ibuprofen (ADVIL) 200 MG tablet Take 400-800 mg by mouth every 6 (six) hours as needed for moderate pain.   metoprolol succinate (TOPROL-XL) 100 MG 24 hr tablet TAKE 1 TABLET BY MOUTH DAILY. TAKE WITH OR IMMEDIATELY FOLLOWING A MEAL.   montelukast (SINGULAIR) 10 MG tablet TAKE 1 TABLET BY MOUTH EVERYDAY AT BEDTIME   Multiple Vitamins-Minerals (MULTIVITAMIN GUMMIES WOMENS PO) Take 2 tablets by mouth daily.   Omega-3 Fatty Acids (FISH OIL PO) Take 1 capsule by mouth daily.   valACYclovir (VALTREX) 1000 MG tablet Take 1 tablet (1,000 mg total) by mouth 2 (two) times daily. (Patient taking differently: Take 1,000 mg by mouth as needed.)    PHQ 2/9 Scores 12/22/2020 01/21/2020 11/30/2019 01/08/2019  PHQ - 2 Score 0 0 0 0  PHQ- 9 Score 0 0 0 -    GAD 7 : Generalized Anxiety Score 12/22/2020  01/21/2020 11/30/2019  Nervous, Anxious, on Edge 0 0 0  Control/stop worrying 0 0 0  Worry too much - different things 0 0 0  Trouble relaxing 0 0 0  Restless 0 0 0  Easily annoyed or irritable 0 0 0  Afraid - awful might happen 0 0 0  Total GAD 7 Score 0 0 0    BP Readings from Last 3 Encounters:  06/05/21 136/82  05/08/21 128/82  04/27/21 (!) 140/100    Physical Exam Vitals and nursing note reviewed.  Constitutional:      General: She is not in acute distress.    Appearance: She is not diaphoretic.  HENT:     Head: Normocephalic and atraumatic.     Right Ear: External ear normal.     Left Ear: External ear normal.     Nose: Nose normal. No congestion or rhinorrhea.     Mouth/Throat:     Mouth: Mucous membranes  are moist.  Eyes:     General:        Right eye: No discharge.        Left eye: No discharge.     Conjunctiva/sclera: Conjunctivae normal.     Pupils: Pupils are equal, round, and reactive to light.  Neck:     Thyroid: No thyromegaly.     Vascular: No JVD.  Cardiovascular:     Rate and Rhythm: Normal rate and regular rhythm.     Heart sounds: Normal heart sounds. No murmur heard.   No friction rub. No gallop.  Pulmonary:     Effort: Pulmonary effort is normal.     Breath sounds: Normal breath sounds. No wheezing or rhonchi.  Abdominal:     General: Bowel sounds are normal.     Palpations: Abdomen is soft. There is no mass.     Tenderness: There is no abdominal tenderness. There is no guarding.  Musculoskeletal:        General: Normal range of motion.     Cervical back: Normal range of motion and neck supple.  Lymphadenopathy:     Cervical: No cervical adenopathy.  Skin:    General: Skin is warm and dry.  Neurological:     Mental Status: She is alert.     Deep Tendon Reflexes: Reflexes are normal and symmetric.    Wt Readings from Last 3 Encounters:  06/05/21 217 lb (98.4 kg)  05/08/21 220 lb (99.8 kg)  04/27/21 223 lb (101.2 kg)    BP 136/82     Pulse 80    Ht 5' 3.5" (1.613 m)    Wt 217 lb (98.4 kg)    LMP 04/27/2020 Comment: Supracervical    BMI 37.84 kg/m   Assessment and Plan:  1. Acute recurrent maxillary sinusitis New recurrence.  Upon manipulation of the left nostril recurrent blood clot was removed and patient still having discomfort in the left maxillary sinus and left nostril.  There is swelling of the area that is suggesting local inflammation as well as there may be tenderness suggesting a chronic abscess/hematoma.  We will check a x-ray to see if there is opacification and if necessary refer to ENT.  In the meantime we will treat with Nasacort Randon Flonase because of the dry nature of the Flonase and Augmentin 875 mg twice a day for 10 days.  - DG Sinuses Complete; Future - triamcinolone (NASACORT) 55 MCG/ACT AERO nasal inhaler; Place 2 sprays into the nose daily.  Dispense: 1 each; Refill: 12 - amoxicillin-clavulanate (AUGMENTIN) 875-125 MG tablet; Take 1 tablet by mouth 2 (two) times daily.  Dispense: 20 tablet; Refill: 0  2. Impetigo New onset.  Persistent.  There is crusting and tenderness of the left nostril consistent with impetigo.  As noted above we will treat with Augmentin however we will also place 2% nasal Bactroban ointment in the nostril for local topical effect. - amoxicillin-clavulanate (AUGMENTIN) 875-125 MG tablet; Take 1 tablet by mouth 2 (two) times daily.  Dispense: 20 tablet; Refill: 0 - mupirocin nasal ointment (BACTROBAN) 2 %; Place 1 application into the nose 2 (two) times daily. Use one-half of tube in each nostril twice daily for five (5) days. After application, press sides of nose together and gently massage.  Dispense: 10 g; Refill: 0

## 2021-06-07 ENCOUNTER — Other Ambulatory Visit: Payer: Self-pay

## 2021-06-07 DIAGNOSIS — J349 Unspecified disorder of nose and nasal sinuses: Secondary | ICD-10-CM

## 2021-06-22 ENCOUNTER — Other Ambulatory Visit: Payer: Self-pay | Admitting: Family Medicine

## 2021-06-22 DIAGNOSIS — N946 Dysmenorrhea, unspecified: Secondary | ICD-10-CM

## 2021-06-22 NOTE — Telephone Encounter (Signed)
Requested Prescriptions  Pending Prescriptions Disp Refills   ibuprofen (ADVIL) 800 MG tablet [Pharmacy Med Name: IBUPROFEN 800 MG TABLET] 90 tablet 2    Sig: TAKE 1 TABLET BY MOUTH EVERY 8 HOURS AS NEEDED     Analgesics:  NSAIDS Failed - 06/22/2021  4:45 PM      Failed - HGB in normal range and within 360 days    Hemoglobin  Date Value Ref Range Status  07/24/2020 11.6 (L) 12.0 - 15.0 g/dL Final         Passed - Cr in normal range and within 360 days    Creat  Date Value Ref Range Status  01/29/2018 0.83 0.50 - 1.10 mg/dL Final   Creatinine, Ser  Date Value Ref Range Status  12/22/2020 0.95 0.57 - 1.00 mg/dL Final         Passed - Patient is not pregnant      Passed - Valid encounter within last 12 months    Recent Outpatient Visits          2 weeks ago Acute recurrent maxillary sinusitis   [facility] Juline Patch, MD   3 months ago Acute maxillary sinusitis, recurrence not specified   [facility] Juline Patch, MD   6 months ago Essential hypertension   [place], MD   11 months ago Chronic maxillary sinusitis   [facility] Juline Patch, MD   11 months ago Essential hypertension   [place], Deanna C, MD      Future Appointments            In 4 days Juline Patch, MD [facility]

## 2021-06-26 ENCOUNTER — Other Ambulatory Visit: Payer: Self-pay

## 2021-06-26 ENCOUNTER — Encounter: Payer: Self-pay | Admitting: Family Medicine

## 2021-06-26 ENCOUNTER — Ambulatory Visit (INDEPENDENT_AMBULATORY_CARE_PROVIDER_SITE_OTHER): Payer: BC Managed Care – PPO | Admitting: Family Medicine

## 2021-06-26 VITALS — BP 124/80 | HR 80 | Ht 63.5 in | Wt 223.0 lb

## 2021-06-26 DIAGNOSIS — U099 Post covid-19 condition, unspecified: Secondary | ICD-10-CM

## 2021-06-26 DIAGNOSIS — R053 Chronic cough: Secondary | ICD-10-CM

## 2021-06-26 DIAGNOSIS — J301 Allergic rhinitis due to pollen: Secondary | ICD-10-CM

## 2021-06-26 DIAGNOSIS — B009 Herpesviral infection, unspecified: Secondary | ICD-10-CM

## 2021-06-26 DIAGNOSIS — I10 Essential (primary) hypertension: Secondary | ICD-10-CM

## 2021-06-26 MED ORDER — MONTELUKAST SODIUM 10 MG PO TABS: ORAL_TABLET | ORAL | 1 refills | Status: DC

## 2021-06-26 MED ORDER — TRIAMCINOLONE ACETONIDE 55 MCG/ACT NA AERO: 2.0000 | INHALATION_SPRAY | Freq: Every day | NASAL | 12 refills | Status: DC

## 2021-06-26 MED ORDER — HYDROCHLOROTHIAZIDE 25 MG PO TABS: 25.0000 mg | ORAL_TABLET | Freq: Every day | ORAL | 1 refills | Status: DC

## 2021-06-26 MED ORDER — METOPROLOL SUCCINATE ER 100 MG PO TB24: ORAL_TABLET | ORAL | 1 refills | Status: DC

## 2021-06-26 MED ORDER — VALACYCLOVIR HCL 1 G PO TABS: 1000.0000 mg | ORAL_TABLET | ORAL | 5 refills | Status: DC | PRN

## 2021-06-26 NOTE — Progress Notes (Signed)
Date:  06/26/2021   Name:  Jaclyn Fields   DOB:  01-03-1971   MRN:  [id number]   Chief Complaint: Allergic Rhinitis , Hypertension, and Headache (Takes Ibuprofen)  Hypertension This is a chronic problem. The current episode started more than 1 year ago. The problem has been gradually worsening since onset. The problem is controlled. Associated symptoms include headaches. Pertinent negatives include no anxiety, blurred vision, chest pain, malaise/fatigue, neck pain, orthopnea, palpitations, peripheral edema, PND, shortness of breath or sweats. There are no associated agents to hypertension. There are no known risk factors for coronary artery disease. Past treatments include beta blockers and diuretics. The current treatment provides moderate improvement. There are no compliance problems.  There is no history of angina, kidney disease, CAD/MI, CVA, heart failure, left ventricular hypertrophy, PVD or retinopathy. There is no history of chronic renal disease, a hypertension causing med or renovascular disease.  Headache  This is a chronic problem. The current episode started more than 1 year ago. The problem occurs intermittently. The problem has been gradually improving. The quality of the pain is described as aching. The pain is moderate. Pertinent negatives include no abdominal pain, back pain, blurred vision, coughing, dizziness, ear pain, eye pain, eye redness, fever, loss of balance, nausea, neck pain, numbness, photophobia, rhinorrhea, sinus pressure, sore throat, vomiting or weakness. The treatment provided moderate relief. Her past medical history is significant for hypertension.   Lab Results  Component Value Date   NA 137 12/22/2020   K 4.5 12/22/2020   CO2 25 12/22/2020   GLUCOSE 99 12/22/2020   BUN 17 12/22/2020   CREATININE 0.95 12/22/2020   CALCIUM 9.6 12/22/2020   EGFR 73 12/22/2020   GFRNONAA >60 04/27/2020   Lab Results  Component Value Date   CHOL 197 12/22/2020   HDL 52  12/22/2020   LDLCALC 123 (H) 12/22/2020   TRIG 123 12/22/2020   CHOLHDL 3.2 04/12/2016   Lab Results  Component Value Date   TSH 1.73 03/03/2020   No results found for: HGBA1C Lab Results  Component Value Date   WBC 11.9 (H) 07/24/2020   HGB 11.6 (L) 07/24/2020   HCT 36.5 07/24/2020   MCV 85.9 07/24/2020   PLT 494 (H) 07/24/2020   Lab Results  Component Value Date   ALT 14 01/21/2020   AST 18 01/21/2020   ALKPHOS 106 01/21/2020   BILITOT 0.3 01/21/2020   Lab Results  Component Value Date   VD25OH 20 (L) 01/12/2019     Review of Systems  Constitutional: Negative.  Negative for chills, fatigue, fever, malaise/fatigue and unexpected weight change.  HENT:  Negative for congestion, ear discharge, ear pain, rhinorrhea, sinus pressure, sneezing and sore throat.   Eyes:  Negative for blurred vision, photophobia, pain, discharge, redness and itching.  Respiratory:  Negative for cough, shortness of breath, wheezing and stridor.   Cardiovascular:  Negative for chest pain, palpitations, orthopnea and PND.  Gastrointestinal:  Negative for abdominal pain, blood in stool, constipation, diarrhea, nausea and vomiting.  Endocrine: Negative for cold intolerance, heat intolerance, polydipsia, polyphagia and polyuria.  Genitourinary:  Negative for dysuria, flank pain, frequency, hematuria, menstrual problem, pelvic pain, urgency, vaginal bleeding and vaginal discharge.  Musculoskeletal:  Negative for arthralgias, back pain, myalgias and neck pain.  Skin:  Negative for rash.  Allergic/Immunologic: Negative for environmental allergies and food allergies.  Neurological:  Positive for headaches. Negative for dizziness, weakness, light-headedness, numbness and loss of balance.  Hematological:  Negative for  adenopathy. Does not bruise/bleed easily.  Psychiatric/Behavioral:  Negative for dysphoric mood. The patient is not nervous/anxious.    Patient Active Problem List   Diagnosis Date Noted    Postoperative state 05/08/2020   Post-operative state 05/08/2020   Essential hypertension 01/02/2018   Transfusion history 08/12/2012   Postpartum hemorrhage 09/05/2011   PP care - s/p 1C/S 3/1 (breech, [facility]) 08/24/2011   Maternal iron deficiency anemia 08/24/2011   Benign essential hypertension complicating pregnancy, childbirth, and the puerperium 08/23/2011    Allergies  Allergen Reactions   Sulfa Antibiotics Swelling and Rash    SWELLING TO FINGERS    Past Surgical History:  Procedure Laterality Date   AUGMENTATION MAMMAPLASTY Bilateral 2005   BARTHOLIN CYST MARSUPIALIZATION Left 05/08/2020   Procedure: BARTHOLIN CYST MARSUPIALIZATION;  Surgeon: [name], MD;  Location: [place];  Service: Gynecology;  Laterality: Left;   CESAREAN SECTION     COLONOSCOPY  2018   DILATION AND EVACUATION  09/05/2011   Procedure: DILATATION AND EVACUATION;  Surgeon: Elveria Royals, MD;  Location: [facility];  Service: Gynecology;  Laterality: Bilateral;   ROBOTIC ASSISTED SUPRACERVICAL HYSTERECTOMY WITH BILATERAL SALPINGO OOPHERECTOMY Bilateral 05/08/2020   Procedure: XI ROBOTIC ASSISTED SUPRACERVICAL HYSTERECTOMY WITH BILATERAL SALPINGECTOMY;  Surgeon: [name], MD;  Location: [place];  Service: Gynecology;  Laterality: Bilateral;   TUBAL LIGATION     WISDOM TOOTH EXTRACTION      Social History   Tobacco Use   Smoking status: Never   Smokeless tobacco: Never  Vaping Use   Vaping Use: Never used  Substance Use Topics   Alcohol use: Yes    Comment: occasionally   Drug use: No     Medication list has been reviewed and updated.  Current Meds  Medication Sig   albuterol (VENTOLIN HFA) 108 (90 Base) MCG/ACT inhaler Inhale 2 puffs into the lungs every 6 (six) hours as needed for wheezing or shortness of breath.   Ascorbic Acid (VITAMIN C GUMMIE PO) Take 1 tablet by mouth daily.   Biotin w/ Vitamins C & E (HAIR/SKIN/NAILS PO) Take 2  tablets by mouth daily.   cetirizine (ZYRTEC) 10 MG tablet Take 1 tablet (10 mg total) by mouth daily. (Patient taking differently: Take 10 mg by mouth at bedtime.)   cholecalciferol (VITAMIN D3) 25 MCG (1000 UNIT) tablet Take 1,000 Units by mouth daily.   fluticasone (FLONASE) 50 MCG/ACT nasal spray SPRAY 2 SPRAYS INTO EACH NOSTRIL EVERY DAY   hydrochlorothiazide (HYDRODIURIL) 25 MG tablet Take 1 tablet (25 mg total) by mouth daily.   metoprolol succinate (TOPROL-XL) 100 MG 24 hr tablet TAKE 1 TABLET BY MOUTH DAILY. TAKE WITH OR IMMEDIATELY FOLLOWING A MEAL.   montelukast (SINGULAIR) 10 MG tablet TAKE 1 TABLET BY MOUTH EVERYDAY AT BEDTIME   Multiple Vitamins-Minerals (MULTIVITAMIN GUMMIES WOMENS PO) Take 2 tablets by mouth daily.   mupirocin nasal ointment (BACTROBAN) 2 % Place 1 application into the nose 2 (two) times daily. Use one-half of tube in each nostril twice daily for five (5) days. After application, press sides of nose together and gently massage.   Omega-3 Fatty Acids (FISH OIL PO) Take 1 capsule by mouth daily.   triamcinolone (NASACORT) 55 MCG/ACT AERO nasal inhaler Place 2 sprays into the nose daily.   valACYclovir (VALTREX) 1000 MG tablet Take 1 tablet (1,000 mg total) by mouth 2 (two) times daily. (Patient taking differently: Take 1,000 mg by mouth as needed.)   [DISCONTINUED] ibuprofen (ADVIL) 200 MG  tablet Take 400-800 mg by mouth every 6 (six) hours as needed for moderate pain.    PHQ 2/9 Scores 12/22/2020 01/21/2020 11/30/2019 01/08/2019  PHQ - 2 Score 0 0 0 0  PHQ- 9 Score 0 0 0 -    GAD 7 : Generalized Anxiety Score 12/22/2020 01/21/2020 11/30/2019  Nervous, Anxious, on Edge 0 0 0  Control/stop worrying 0 0 0  Worry too much - different things 0 0 0  Trouble relaxing 0 0 0  Restless 0 0 0  Easily annoyed or irritable 0 0 0  Afraid - awful might happen 0 0 0  Total GAD 7 Score 0 0 0    BP Readings from Last 3 Encounters:  06/26/21 124/80  06/05/21 136/82  05/08/21  128/82    Physical Exam Vitals and nursing note reviewed.  Constitutional:      Appearance: She is well-developed.  HENT:     Head: Normocephalic.     Right Ear: External ear normal.     Left Ear: External ear normal.  Eyes:     General: Lids are everted, no foreign bodies appreciated. No scleral icterus.       Left eye: No foreign body or hordeolum.     Conjunctiva/sclera: Conjunctivae normal.     Right eye: Right conjunctiva is not injected.     Left eye: Left conjunctiva is not injected.     Pupils: Pupils are equal, round, and reactive to light.  Neck:     Thyroid: No thyromegaly.     Vascular: No JVD.     Trachea: No tracheal deviation.  Cardiovascular:     Rate and Rhythm: Normal rate and regular rhythm.     Heart sounds: Normal heart sounds. No murmur heard.   No friction rub. No gallop.  Pulmonary:     Effort: Pulmonary effort is normal. No respiratory distress.     Breath sounds: Normal breath sounds. No wheezing or rales.  Abdominal:     General: Bowel sounds are normal.     Palpations: Abdomen is soft. There is no mass.     Tenderness: There is no abdominal tenderness. There is no guarding or rebound.  Musculoskeletal:        General: No tenderness. Normal range of motion.     Cervical back: Normal range of motion and neck supple.  Lymphadenopathy:     Cervical: No cervical adenopathy.  Skin:    General: Skin is warm.     Findings: No rash.  Neurological:     Mental Status: She is alert and oriented to person, place, and time.     Cranial Nerves: No cranial nerve deficit.     Deep Tendon Reflexes: Reflexes normal.  Psychiatric:        Mood and Affect: Mood is not anxious or depressed.    Wt Readings from Last 3 Encounters:  06/26/21 223 lb (101.2 kg)  06/05/21 217 lb (98.4 kg)  05/08/21 220 lb (99.8 kg)    BP 124/80    Pulse 80    Ht 5' 3.5" (1.613 m)    Wt 223 lb (101.2 kg)    LMP 04/27/2020 Comment: Supracervical    BMI 38.88 kg/m   Assessment  and Plan:  1. Essential hypertension Chronic.  Controlled.  Stable.  Blood pressure today is 124/80.  Continue hydrochlorothiazide 25 mg once a day and metoprolol XL 100 mg once a day.  Review of renal function panel is acceptable at this time and  we will repeat this in 6 months upon the patient's return for blood pressure check. - hydrochlorothiazide (HYDRODIURIL) 25 MG tablet; Take 1 tablet (25 mg total) by mouth daily.  Dispense: 90 tablet; Refill: 1 - metoprolol succinate (TOPROL-XL) 100 MG 24 hr tablet; TAKE 1 TABLET BY MOUTH DAILY. TAKE WITH OR IMMEDIATELY FOLLOWING A MEAL.  Dispense: 90 tablet; Refill: 1  2. Post-COVID chronic cough Chronic.  Controlled.  Stable.  Continue Singulair 10 mg once a day.  Patient is doing well with this to prevent her cough. - montelukast (SINGULAIR) 10 MG tablet; TAKE 1 TABLET BY MOUTH EVERYDAY AT BEDTIME  Dispense: 90 tablet; Refill: 1  3. HSV-1 (herpes simplex virus 1) infection Chronic.  Controlled.  Stable.  Continue on as-needed basis Valtrex 1 g as needed for flareup of viral syndrome. - valACYclovir (VALTREX) 1000 MG tablet; Take 1 tablet (1,000 mg total) by mouth as needed (for flare ups).  Dispense: 10 tablet; Refill: 5  4. Seasonal allergic rhinitis due to pollen Chronic.  Controlled.  Stable.  Continue combination Singulair 10 mg 1 nightly, Zyrtec 1 every morning, and Nasacort 2 spray in each nostril daily. - montelukast (SINGULAIR) 10 MG tablet; TAKE 1 TABLET BY MOUTH EVERYDAY AT BEDTIME  Dispense: 90 tablet; Refill: 1 - triamcinolone (NASACORT) 55 MCG/ACT AERO nasal inhaler; Place 2 sprays into the nose daily.  Dispense: 1 each; Refill: 12

## 2021-06-26 NOTE — Patient Instructions (Signed)

## 2021-06-27 ENCOUNTER — Other Ambulatory Visit: Payer: Self-pay | Admitting: Family Medicine

## 2021-06-28 NOTE — Telephone Encounter (Signed)
Medication discontinued on 06/26/21. No longer appropriate refill request.

## 2021-06-29 DIAGNOSIS — Z029 Encounter for administrative examinations, unspecified: Secondary | ICD-10-CM | POA: Diagnosis not present

## 2021-06-29 DIAGNOSIS — E119 Type 2 diabetes mellitus without complications: Secondary | ICD-10-CM | POA: Diagnosis not present

## 2021-06-29 DIAGNOSIS — M549 Dorsalgia, unspecified: Secondary | ICD-10-CM | POA: Diagnosis not present

## 2021-12-18 ENCOUNTER — Telehealth: Payer: Self-pay | Admitting: Family Medicine

## 2021-12-18 ENCOUNTER — Other Ambulatory Visit: Payer: Self-pay

## 2021-12-18 DIAGNOSIS — I10 Essential (primary) hypertension: Secondary | ICD-10-CM

## 2021-12-18 MED ORDER — HYDROCHLOROTHIAZIDE 25 MG PO TABS: 25.0000 mg | ORAL_TABLET | Freq: Every day | ORAL | 0 refills | Status: DC

## 2021-12-21 ENCOUNTER — Ambulatory Visit (INDEPENDENT_AMBULATORY_CARE_PROVIDER_SITE_OTHER): Payer: BC Managed Care – PPO | Admitting: Family Medicine

## 2021-12-21 ENCOUNTER — Ambulatory Visit: Payer: BC Managed Care – PPO | Admitting: Family Medicine

## 2021-12-21 ENCOUNTER — Encounter: Payer: Self-pay | Admitting: Family Medicine

## 2021-12-21 VITALS — BP 148/100 | HR 80 | Ht 63.5 in | Wt 217.0 lb

## 2021-12-21 DIAGNOSIS — J301 Allergic rhinitis due to pollen: Secondary | ICD-10-CM | POA: Diagnosis not present

## 2021-12-21 DIAGNOSIS — B009 Herpesviral infection, unspecified: Secondary | ICD-10-CM | POA: Diagnosis not present

## 2021-12-21 DIAGNOSIS — U099 Post covid-19 condition, unspecified: Secondary | ICD-10-CM

## 2021-12-21 DIAGNOSIS — R053 Chronic cough: Secondary | ICD-10-CM | POA: Diagnosis not present

## 2021-12-21 DIAGNOSIS — J4520 Mild intermittent asthma, uncomplicated: Secondary | ICD-10-CM

## 2021-12-21 DIAGNOSIS — I10 Essential (primary) hypertension: Secondary | ICD-10-CM | POA: Diagnosis not present

## 2021-12-21 MED ORDER — MONTELUKAST SODIUM 10 MG PO TABS: ORAL_TABLET | ORAL | 1 refills | Status: DC

## 2021-12-21 MED ORDER — ALBUTEROL SULFATE HFA 108 (90 BASE) MCG/ACT IN AERS: 2.0000 | INHALATION_SPRAY | Freq: Four times a day (QID) | RESPIRATORY_TRACT | 2 refills | Status: DC | PRN

## 2021-12-21 MED ORDER — TRIAMCINOLONE ACETONIDE 55 MCG/ACT NA AERO: 2.0000 | INHALATION_SPRAY | Freq: Every day | NASAL | 12 refills | Status: AC

## 2021-12-21 MED ORDER — AMLODIPINE BESYLATE 2.5 MG PO TABS: 2.5000 mg | ORAL_TABLET | Freq: Every day | ORAL | 0 refills | Status: DC

## 2021-12-21 MED ORDER — VALACYCLOVIR HCL 1 G PO TABS: 1000.0000 mg | ORAL_TABLET | ORAL | 5 refills | Status: DC | PRN

## 2021-12-21 MED ORDER — HYDROCHLOROTHIAZIDE 25 MG PO TABS: 25.0000 mg | ORAL_TABLET | Freq: Every day | ORAL | 0 refills | Status: DC

## 2021-12-21 MED ORDER — METOPROLOL SUCCINATE ER 100 MG PO TB24: ORAL_TABLET | ORAL | 1 refills | Status: DC

## 2021-12-21 NOTE — Progress Notes (Signed)
Date:  12/21/2021   Name:  Jaclyn Fields   DOB:  1971/02/12   MRN:  [id number]   Chief Complaint: Allergic Rhinitis , Asthma, Hypertension, and Mouth Lesions (Cold sores- uses valacyclovir)  Asthma She complains of shortness of breath and wheezing. There is no chest tightness, cough, difficulty breathing, frequent throat clearing, hemoptysis, hoarse voice or sputum production. This is a chronic problem. The current episode started more than 1 year ago. The problem occurs intermittently. The problem has been gradually worsening. Pertinent negatives include no appetite change, chest pain, dyspnea on exertion, ear congestion, ear pain, fever, headaches, heartburn, malaise/fatigue, myalgias, nasal congestion, orthopnea, PND, postnasal drip, rhinorrhea, sneezing, sore throat, sweats, trouble swallowing or weight loss. Her symptoms are alleviated by beta-agonist and leukotriene antagonist. She reports moderate improvement on treatment. Her past medical history is significant for asthma.  Hypertension This is a chronic problem. The current episode started more than 1 year ago. The problem has been waxing and waning since onset. The problem is uncontrolled. Associated symptoms include shortness of breath. Pertinent negatives include no chest pain, headaches, malaise/fatigue, PND or sweats. Past treatments include beta blockers and diuretics. The current treatment provides moderate improvement. There are no compliance problems.  There is no history of angina, kidney disease, CAD/MI, CVA, heart failure, left ventricular hypertrophy, PVD or retinopathy. There is no history of chronic renal disease, a hypertension causing med or renovascular disease.  Mouth Lesions  Episode onset: last outbreak months. Associated symptoms include congestion, mouth sores, URI and wheezing. Pertinent negatives include no fever, no ear pain, no headaches, no rhinorrhea, no sore throat and no cough.  URI  Chronicity: for allergic  rhinitis. The current episode started more than 1 year ago. The problem has been waxing and waning. There has been no fever. Associated symptoms include congestion and wheezing. Pertinent negatives include no chest pain, coughing, ear pain, headaches, rhinorrhea, sneezing or sore throat. She has tried nothing for the symptoms.    Lab Results  Component Value Date   NA 137 12/22/2020   K 4.5 12/22/2020   CO2 25 12/22/2020   GLUCOSE 99 12/22/2020   BUN 17 12/22/2020   CREATININE 0.95 12/22/2020   CALCIUM 9.6 12/22/2020   EGFR 73 12/22/2020   GFRNONAA >60 04/27/2020   Lab Results  Component Value Date   CHOL 197 12/22/2020   HDL 52 12/22/2020   LDLCALC 123 (H) 12/22/2020   TRIG 123 12/22/2020   CHOLHDL 3.2 04/12/2016   Lab Results  Component Value Date   TSH 1.73 03/03/2020   No results found for: "HGBA1C" Lab Results  Component Value Date   WBC 11.9 (H) 07/24/2020   HGB 11.6 (L) 07/24/2020   HCT 36.5 07/24/2020   MCV 85.9 07/24/2020   PLT 494 (H) 07/24/2020   Lab Results  Component Value Date   ALT 14 01/21/2020   AST 18 01/21/2020   ALKPHOS 106 01/21/2020   BILITOT 0.3 01/21/2020   Lab Results  Component Value Date   VD25OH 20 (L) 01/12/2019     Review of Systems  Constitutional:  Negative for appetite change, fever, malaise/fatigue and weight loss.  HENT:  Positive for congestion and mouth sores. Negative for ear pain, hoarse voice, postnasal drip, rhinorrhea, sneezing, sore throat and trouble swallowing.   Respiratory:  Positive for shortness of breath and wheezing. Negative for cough, hemoptysis and sputum production.   Cardiovascular:  Negative for chest pain, dyspnea on exertion and PND.  Gastrointestinal:  Negative for heartburn.  Musculoskeletal:  Negative for myalgias.  Neurological:  Negative for headaches.    Patient Active Problem List   Diagnosis Date Noted   Postoperative state 05/08/2020   Post-operative state 05/08/2020   Essential  hypertension 01/02/2018   Transfusion history 08/12/2012   Postpartum hemorrhage 09/05/2011   PP care - s/p 1C/S 3/1 (breech, [facility]) 08/24/2011   Maternal iron deficiency anemia 08/24/2011   Benign essential hypertension complicating pregnancy, childbirth, and the puerperium 08/23/2011    Allergies  Allergen Reactions   Sulfa Antibiotics Swelling and Rash    SWELLING TO FINGERS    Past Surgical History:  Procedure Laterality Date   AUGMENTATION MAMMAPLASTY Bilateral 2005   BARTHOLIN CYST MARSUPIALIZATION Left 05/08/2020   Procedure: BARTHOLIN CYST MARSUPIALIZATION;  Surgeon: [name], MD;  Location: [place];  Service: Gynecology;  Laterality: Left;   CESAREAN SECTION     COLONOSCOPY  2018   DILATION AND EVACUATION  09/05/2011   Procedure: DILATATION AND EVACUATION;  Surgeon: Elveria Royals, MD;  Location: [facility];  Service: Gynecology;  Laterality: Bilateral;   ROBOTIC ASSISTED SUPRACERVICAL HYSTERECTOMY WITH BILATERAL SALPINGO OOPHERECTOMY Bilateral 05/08/2020   Procedure: XI ROBOTIC ASSISTED SUPRACERVICAL HYSTERECTOMY WITH BILATERAL SALPINGECTOMY;  Surgeon: [name], MD;  Location: [place];  Service: Gynecology;  Laterality: Bilateral;   TUBAL LIGATION     WISDOM TOOTH EXTRACTION      Social History   Tobacco Use   Smoking status: Never   Smokeless tobacco: Never  Vaping Use   Vaping Use: Never used  Substance Use Topics   Alcohol use: Yes    Comment: occasionally   Drug use: No     Medication list has been reviewed and updated.  Current Meds  Medication Sig   albuterol (VENTOLIN HFA) 108 (90 Base) MCG/ACT inhaler Inhale 2 puffs into the lungs every 6 (six) hours as needed for wheezing or shortness of breath.   Ascorbic Acid (VITAMIN C GUMMIE PO) Take 1 tablet by mouth daily.   Biotin w/ Vitamins C & E (HAIR/SKIN/NAILS PO) Take 2 tablets by mouth daily.   cetirizine (ZYRTEC) 10 MG tablet Take 1 tablet (10 mg  total) by mouth daily. (Patient taking differently: Take 10 mg by mouth at bedtime.)   cholecalciferol (VITAMIN D3) 25 MCG (1000 UNIT) tablet Take 1,000 Units by mouth daily.   hydrochlorothiazide (HYDRODIURIL) 25 MG tablet Take 1 tablet (25 mg total) by mouth daily.   metoprolol succinate (TOPROL-XL) 100 MG 24 hr tablet TAKE 1 TABLET BY MOUTH DAILY. TAKE WITH OR IMMEDIATELY FOLLOWING A MEAL.   montelukast (SINGULAIR) 10 MG tablet TAKE 1 TABLET BY MOUTH EVERYDAY AT BEDTIME   Multiple Vitamins-Minerals (MULTIVITAMIN GUMMIES WOMENS PO) Take 2 tablets by mouth daily.   mupirocin nasal ointment (BACTROBAN) 2 % Place 1 application into the nose 2 (two) times daily. Use one-half of tube in each nostril twice daily for five (5) days. After application, press sides of nose together and gently massage.   Omega-3 Fatty Acids (FISH OIL PO) Take 1 capsule by mouth daily.   triamcinolone (NASACORT) 55 MCG/ACT AERO nasal inhaler Place 2 sprays into the nose daily.   valACYclovir (VALTREX) 1000 MG tablet Take 1 tablet (1,000 mg total) by mouth as needed (for flare ups).       12/21/2021   11:30 AM 12/22/2020    9:19 AM 01/21/2020    9:42 AM 11/30/2019    1:37 PM  GAD 7 : Generalized  Anxiety Score  Nervous, Anxious, on Edge 0 0 0 0  Control/stop worrying 0 0 0 0  Worry too much - different things 0 0 0 0  Trouble relaxing 0 0 0 0  Restless 0 0 0 0  Easily annoyed or irritable 0 0 0 0  Afraid - awful might happen 0 0 0 0  Total GAD 7 Score 0 0 0 0  Anxiety Difficulty Not difficult at all          12/21/2021   11:30 AM 12/22/2020    9:19 AM 01/21/2020    9:42 AM  Depression screen PHQ 2/9  Decreased Interest 0 0 0  Down, Depressed, Hopeless 0 0 0  PHQ - 2 Score 0 0 0  Altered sleeping 0 0 0  Tired, decreased energy 0 0 0  Change in appetite 0 0 0  Feeling bad or failure about yourself  0 0 0  Trouble concentrating 0 0 0  Moving slowly or fidgety/restless 0 0 0  Suicidal thoughts 0 0 0  PHQ-9 Score  0 0 0  Difficult doing work/chores Not difficult at all      BP Readings from Last 3 Encounters:  12/21/21 (!) 128/100  06/26/21 124/80  06/05/21 136/82    Physical Exam Vitals and nursing note reviewed. Exam conducted with a chaperone present.  Constitutional:      General: She is not in acute distress.    Appearance: She is not diaphoretic.  HENT:     Head: Normocephalic and atraumatic.     Right Ear: Tympanic membrane and external ear normal.     Left Ear: Tympanic membrane and external ear normal.     Nose: Nose normal. No congestion.     Mouth/Throat:     Mouth: Mucous membranes are moist.  Eyes:     General:        Right eye: No discharge.        Left eye: No discharge.     Conjunctiva/sclera: Conjunctivae normal.     Pupils: Pupils are equal, round, and reactive to light.  Neck:     Thyroid: No thyromegaly.     Vascular: No JVD.  Cardiovascular:     Rate and Rhythm: Normal rate and regular rhythm.     Chest Wall: PMI is not displaced.     Pulses: Normal pulses.     Heart sounds: Normal heart sounds, S1 normal and S2 normal. No murmur heard.    No systolic murmur is present.     No diastolic murmur is present.     No friction rub. No gallop. No S3 or S4 sounds.  Pulmonary:     Effort: Pulmonary effort is normal.     Breath sounds: Normal breath sounds.  Abdominal:     General: Bowel sounds are normal.     Palpations: Abdomen is soft. There is no mass.     Tenderness: There is no abdominal tenderness. There is no guarding or rebound.  Musculoskeletal:        General: Normal range of motion.     Cervical back: Normal range of motion and neck supple.  Lymphadenopathy:     Cervical: No cervical adenopathy.  Skin:    General: Skin is warm and dry.  Neurological:     Mental Status: She is alert.     Deep Tendon Reflexes: Reflexes are normal and symmetric.     Wt Readings from Last 3 Encounters:  12/21/21 217 lb (98.4  kg)  06/26/21 223 lb (101.2 kg)   06/05/21 217 lb (98.4 kg)    BP (!) 128/100   Pulse 80   Ht 5' 3.5" (1.613 m)   Wt 217 lb (98.4 kg)   LMP 04/27/2020 Comment: Supracervical   BMI 37.84 kg/m   Assessment and Plan:  1. Essential hypertension Chronic.  Uncontrolled.  Stable.  Blood pressure 148/100 and repeat was still elevated.  Patient took her medication about an hour before coming however we will go ahead and initiate amlodipine 2.5 mg along with her current dosing of metoprolol XL 100 mg daily and hydrochlorothiazide 25 mg once a day.  We will recheck patient in 6 weeks. - hydrochlorothiazide (HYDRODIURIL) 25 MG tablet; Take 1 tablet (25 mg total) by mouth daily.  Dispense: 90 tablet; Refill: 0 - metoprolol succinate (TOPROL-XL) 100 MG 24 hr tablet; TAKE 1 TABLET BY MOUTH DAILY. TAKE WITH OR IMMEDIATELY FOLLOWING A MEAL.  Dispense: 90 tablet; Refill: 1 - amLODipine (NORVASC) 2.5 MG tablet; Take 1 tablet (2.5 mg total) by mouth daily.  Dispense: 60 tablet; Refill: 0  2. Post-COVID chronic cough New onset.  Persistent.  Gradually improving but patient will continue on Singulair 10 mg once a day. - montelukast (SINGULAIR) 10 MG tablet; TAKE 1 TABLET BY MOUTH EVERYDAY AT BEDTIME  Dispense: 90 tablet; Refill: 1  3. Seasonal allergic rhinitis due to pollen As noted above - montelukast (SINGULAIR) 10 MG tablet; TAKE 1 TABLET BY MOUTH EVERYDAY AT BEDTIME  Dispense: 90 tablet; Refill: 1 - triamcinolone (NASACORT) 55 MCG/ACT AERO nasal inhaler; Place 2 sprays into the nose daily.  Dispense: 1 each; Refill: 12  4. HSV-1 (herpes simplex virus 1) infection Chronic.  Controlled.  Stable.  Continue Valtrex on as-needed basis. - valACYclovir (VALTREX) 1000 MG tablet; Take 1 tablet (1,000 mg total) by mouth as needed (for flare ups).  Dispense: 10 tablet; Refill: 5  5. Mild intermittent reactive airway disease without complication Chronic.  Intermittent.  Mild.  Currently has not taken albuterol but with the haze and air  conditions current I have not pressed upon patient to go ahead and resume her albuterol particularly before overseas travel. - albuterol (VENTOLIN HFA) 108 (90 Base) MCG/ACT inhaler; Inhale 2 puffs into the lungs every 6 (six) hours as needed for wheezing or shortness of breath.  Dispense: 8 g; Refill: 2

## 2021-12-24 ENCOUNTER — Ambulatory Visit: Payer: BC Managed Care – PPO | Admitting: Family Medicine

## 2022-01-03 DIAGNOSIS — M25562 Pain in left knee: Secondary | ICD-10-CM | POA: Diagnosis not present

## 2022-01-22 DIAGNOSIS — M25562 Pain in left knee: Secondary | ICD-10-CM | POA: Diagnosis not present

## 2022-02-07 ENCOUNTER — Encounter: Payer: Self-pay | Admitting: Family Medicine

## 2022-02-07 ENCOUNTER — Ambulatory Visit (INDEPENDENT_AMBULATORY_CARE_PROVIDER_SITE_OTHER): Payer: BC Managed Care – PPO | Admitting: Family Medicine

## 2022-02-07 VITALS — BP 138/88 | HR 80 | Ht 63.5 in | Wt 210.0 lb

## 2022-02-07 DIAGNOSIS — E7801 Familial hypercholesterolemia: Secondary | ICD-10-CM

## 2022-02-07 DIAGNOSIS — I10 Essential (primary) hypertension: Secondary | ICD-10-CM

## 2022-02-07 MED ORDER — AMLODIPINE BESYLATE 2.5 MG PO TABS: 2.5000 mg | ORAL_TABLET | Freq: Every day | ORAL | 1 refills | Status: DC

## 2022-02-07 MED ORDER — METOPROLOL SUCCINATE ER 100 MG PO TB24: ORAL_TABLET | ORAL | 1 refills | Status: DC

## 2022-02-07 MED ORDER — HYDROCHLOROTHIAZIDE 25 MG PO TABS: 25.0000 mg | ORAL_TABLET | Freq: Every day | ORAL | 1 refills | Status: DC

## 2022-02-07 NOTE — Progress Notes (Signed)
Date:  02/07/2022   Name:  Jaclyn Fields   DOB:  [birth year]   MRN:  [id number]   Chief Complaint: Hypertension  Hypertension This is a chronic problem. The current episode started more than 1 year ago. The problem has been gradually improving since onset. The problem is controlled. Pertinent negatives include no anxiety, blurred vision, chest pain, headaches, neck pain, orthopnea, palpitations, peripheral edema, PND or shortness of breath. There are no associated agents to hypertension. Risk factors for coronary artery disease include dyslipidemia. Past treatments include beta blockers, diuretics and calcium channel blockers. The current treatment provides moderate improvement. There are no compliance problems.  There is no history of angina, kidney disease, CAD/MI, CVA, heart failure, left ventricular hypertrophy, PVD or retinopathy.    Lab Results  Component Value Date   NA 137 12/22/2020   K 4.5 12/22/2020   CO2 25 12/22/2020   GLUCOSE 99 12/22/2020   BUN 17 12/22/2020   CREATININE 0.95 12/22/2020   CALCIUM 9.6 12/22/2020   EGFR 73 12/22/2020   GFRNONAA >60 04/27/2020   Lab Results  Component Value Date   CHOL 197 12/22/2020   HDL 52 12/22/2020   LDLCALC 123 (H) 12/22/2020   TRIG 123 12/22/2020   CHOLHDL 3.2 04/12/2016   Lab Results  Component Value Date   TSH 1.73 03/03/2020   No results found for: "HGBA1C" Lab Results  Component Value Date   WBC 11.9 (H) 07/24/2020   HGB 11.6 (L) 07/24/2020   HCT 36.5 07/24/2020   MCV 85.9 07/24/2020   PLT 494 (H) 07/24/2020   Lab Results  Component Value Date   ALT 14 01/21/2020   AST 18 01/21/2020   ALKPHOS 106 01/21/2020   BILITOT 0.3 01/21/2020   Lab Results  Component Value Date   VD25OH 20 (L) 01/12/2019     Review of Systems  Constitutional: Negative.  Negative for chills, fatigue, fever and unexpected weight change.  HENT:  Negative for congestion, ear discharge, ear pain, rhinorrhea, sinus pressure,  sneezing and sore throat.   Eyes:  Negative for blurred vision.  Respiratory:  Negative for cough, shortness of breath, wheezing and stridor.   Cardiovascular:  Negative for chest pain, palpitations, orthopnea and PND.  Gastrointestinal:  Negative for abdominal pain, blood in stool, constipation, diarrhea and nausea.  Genitourinary:  Negative for dysuria, flank pain, frequency, hematuria, urgency and vaginal discharge.  Musculoskeletal:  Negative for arthralgias, back pain, myalgias and neck pain.  Skin:  Negative for rash.  Neurological:  Negative for dizziness, weakness and headaches.  Hematological:  Negative for adenopathy. Does not bruise/bleed easily.  Psychiatric/Behavioral:  Negative for dysphoric mood. The patient is not nervous/anxious.     Patient Active Problem List   Diagnosis Date Noted   Postoperative state 05/08/2020   Post-operative state 05/08/2020   Essential hypertension 01/02/2018   Transfusion history 08/12/2012   Postpartum hemorrhage 09/05/2011   PP care - s/p 1C/S 3/1 (breech, [facility]) 08/24/2011   Maternal iron deficiency anemia 08/24/2011   Benign essential hypertension complicating pregnancy, childbirth, and the puerperium 08/23/2011    Allergies  Allergen Reactions   Sulfa Antibiotics Swelling and Rash    SWELLING TO FINGERS    Past Surgical History:  Procedure Laterality Date   AUGMENTATION MAMMAPLASTY Bilateral 2005   BARTHOLIN CYST MARSUPIALIZATION Left 05/08/2020   Procedure: BARTHOLIN CYST MARSUPIALIZATION;  Surgeon: [name], MD;  Location: [facility];  Service: Gynecology;  Laterality: Left;   CESAREAN SECTION  COLONOSCOPY  2018   DILATION AND EVACUATION  09/05/2011   Procedure: DILATATION AND EVACUATION;  Surgeon: Elveria Royals, MD;  Location: [facility];  Service: Gynecology;  Laterality: Bilateral;   ROBOTIC ASSISTED SUPRACERVICAL HYSTERECTOMY WITH BILATERAL SALPINGO OOPHERECTOMY Bilateral 05/08/2020   Procedure:  XI ROBOTIC ASSISTED SUPRACERVICAL HYSTERECTOMY WITH BILATERAL SALPINGECTOMY;  Surgeon: [name], MD;  Location: [facility];  Service: Gynecology;  Laterality: Bilateral;   TUBAL LIGATION     WISDOM TOOTH EXTRACTION      Social History   Tobacco Use   Smoking status: Never   Smokeless tobacco: Never  Vaping Use   Vaping Use: Never used  Substance Use Topics   Alcohol use: Yes    Comment: occasionally   Drug use: No     Medication list has been reviewed and updated.  Current Meds  Medication Sig   albuterol (VENTOLIN HFA) 108 (90 Base) MCG/ACT inhaler Inhale 2 puffs into the lungs every 6 (six) hours as needed for wheezing or shortness of breath.   amLODipine (NORVASC) 2.5 MG tablet Take 1 tablet (2.5 mg total) by mouth daily.   Ascorbic Acid (VITAMIN C GUMMIE PO) Take 1 tablet by mouth daily.   Biotin w/ Vitamins C & E (HAIR/SKIN/NAILS PO) Take 2 tablets by mouth daily.   cetirizine (ZYRTEC) 10 MG tablet Take 1 tablet (10 mg total) by mouth daily. (Patient taking differently: Take 10 mg by mouth at bedtime.)   cholecalciferol (VITAMIN D3) 25 MCG (1000 UNIT) tablet Take 1,000 Units by mouth daily.   hydrochlorothiazide (HYDRODIURIL) 25 MG tablet Take 1 tablet (25 mg total) by mouth daily.   metoprolol succinate (TOPROL-XL) 100 MG 24 hr tablet TAKE 1 TABLET BY MOUTH DAILY. TAKE WITH OR IMMEDIATELY FOLLOWING A MEAL.   montelukast (SINGULAIR) 10 MG tablet TAKE 1 TABLET BY MOUTH EVERYDAY AT BEDTIME   Multiple Vitamins-Minerals (MULTIVITAMIN GUMMIES WOMENS PO) Take 2 tablets by mouth daily.   mupirocin nasal ointment (BACTROBAN) 2 % Place 1 application into the nose 2 (two) times daily. Use one-half of tube in each nostril twice daily for five (5) days. After application, press sides of nose together and gently massage.   Omega-3 Fatty Acids (FISH OIL PO) Take 1 capsule by mouth daily.   triamcinolone (NASACORT) 55 MCG/ACT AERO nasal inhaler Place 2 sprays into  the nose daily.   valACYclovir (VALTREX) 1000 MG tablet Take 1 tablet (1,000 mg total) by mouth as needed (for flare ups).       02/07/2022    9:33 AM 12/21/2021   11:30 AM 12/22/2020    9:19 AM 01/21/2020    9:42 AM  GAD 7 : Generalized Anxiety Score  Nervous, Anxious, on Edge 0 0 0 0  Control/stop worrying 0 0 0 0  Worry too much - different things 0 0 0 0  Trouble relaxing 0 0 0 0  Restless 0 0 0 0  Easily annoyed or irritable 0 0 0 0  Afraid - awful might happen 0 0 0 0  Total GAD 7 Score 0 0 0 0  Anxiety Difficulty Not difficult at all Not difficult at all         02/07/2022    9:33 AM 12/21/2021   11:30 AM 12/22/2020    9:19 AM  Depression screen PHQ 2/9  Decreased Interest 0 0 0  Down, Depressed, Hopeless 0 0 0  PHQ - 2 Score 0 0 0  Altered sleeping 0 0 0  Tired,  decreased energy 0 0 0  Change in appetite 0 0 0  Feeling bad or failure about yourself  0 0 0  Trouble concentrating 0 0 0  Moving slowly or fidgety/restless 0 0 0  Suicidal thoughts 0 0 0  PHQ-9 Score 0 0 0  Difficult doing work/chores Not difficult at all Not difficult at all     BP Readings from Last 3 Encounters:  02/07/22 138/88  12/21/21 (!) 148/100  06/26/21 124/80    Physical Exam Vitals and nursing note reviewed. Exam conducted with a chaperone present.  Constitutional:      General: She is not in acute distress.    Appearance: She is not diaphoretic.  HENT:     Head: Normocephalic and atraumatic.     Right Ear: Tympanic membrane and external ear normal.     Left Ear: Tympanic membrane and external ear normal.     Nose: Nose normal.  Eyes:     General:        Right eye: No discharge.        Left eye: No discharge.     Conjunctiva/sclera: Conjunctivae normal.     Pupils: Pupils are equal, round, and reactive to light.  Neck:     Thyroid: No thyromegaly.     Vascular: No JVD.  Cardiovascular:     Rate and Rhythm: Normal rate and regular rhythm.     Heart sounds: Normal heart sounds.  No murmur heard.    No friction rub. No gallop.  Pulmonary:     Effort: Pulmonary effort is normal.     Breath sounds: Normal breath sounds. No wheezing or rhonchi.  Abdominal:     General: Bowel sounds are normal.     Palpations: Abdomen is soft. There is no mass.     Tenderness: There is no abdominal tenderness. There is no guarding or rebound.  Musculoskeletal:        General: Normal range of motion.     Cervical back: Normal range of motion and neck supple.  Lymphadenopathy:     Cervical: No cervical adenopathy.  Skin:    General: Skin is warm and dry.  Neurological:     Mental Status: She is alert.     Deep Tendon Reflexes: Reflexes are normal and symmetric.     Wt Readings from Last 3 Encounters:  02/07/22 210 lb (95.3 kg)  12/21/21 217 lb (98.4 kg)  06/26/21 223 lb (101.2 kg)    BP 138/88   Pulse 80   Ht 5' 3.5" (1.613 m)   Wt 210 lb (95.3 kg)   LMP 04/27/2020 Comment: Supracervical   BMI 36.62 kg/m   Assessment and Plan: 1. Essential hypertension Chronic.  Controlled.  Stable.  Blood pressure today 138/78.  Asymptomatic.  Tolerating medications very well.  Continue amlodipine 2.5 mg once a day, hydrochlorothiazide 25 mg once a day, Toprol XL 100 mg once a day.  We will recheck in 6 months. - Lipid Panel With LDL/HDL Ratio - Comprehensive Metabolic Panel (CMET) - amLODipine (NORVASC) 2.5 MG tablet; Take 1 tablet (2.5 mg total) by mouth daily.  Dispense: 90 tablet; Refill: 1 - hydrochlorothiazide (HYDRODIURIL) 25 MG tablet; Take 1 tablet (25 mg total) by mouth daily.  Dispense: 90 tablet; Refill: 1 - metoprolol succinate (TOPROL-XL) 100 MG 24 hr tablet; TAKE 1 TABLET BY MOUTH DAILY. TAKE WITH OR IMMEDIATELY FOLLOWING A MEAL.  Dispense: 90 tablet; Refill: 1  2. Familial hypercholesterolemia Chronic.  Controlled.  Stable.  Will check lipid panel just accordingly. - Lipid Panel With LDL/HDL Ratio     Otilio Miu, MD

## 2022-02-08 LAB — LIPID PANEL WITH LDL/HDL RATIO
Cholesterol, Total: 198 mg/dL (ref 100–199)
HDL: 54 mg/dL (ref >39)
LDL Chol Calc (NIH): 127 mg/dL — ABNORMAL HIGH (ref 0–99)
LDL/HDL Ratio: 2.4 ratio (ref 0.0–3.2)
Triglycerides: 96 mg/dL (ref 0–149)
VLDL Cholesterol Cal: 17 mg/dL (ref 5–40)

## 2022-02-08 LAB — COMPREHENSIVE METABOLIC PANEL
ALT: 9 IU/L (ref 0–32)
AST: 10 IU/L (ref 0–40)
Albumin/Globulin Ratio: 1.3 (ref 1.2–2.2)
Albumin: 4.1 g/dL (ref 3.9–4.9)
Alkaline Phosphatase: 111 IU/L (ref 44–121)
BUN/Creatinine Ratio: 21 (ref 9–23)
BUN: 21 mg/dL (ref 6–24)
Bilirubin Total: 0.3 mg/dL (ref 0.0–1.2)
CO2: 25 mmol/L (ref 20–29)
Calcium: 9.5 mg/dL (ref 8.7–10.2)
Chloride: 101 mmol/L (ref 96–106)
Creatinine, Ser: 0.98 mg/dL (ref 0.57–1.00)
Globulin, Total: 3.2 g/dL (ref 1.5–4.5)
Glucose: 108 mg/dL — ABNORMAL HIGH (ref 70–99)
Potassium: 4 mmol/L (ref 3.5–5.2)
Sodium: 140 mmol/L (ref 134–144)
Total Protein: 7.3 g/dL (ref 6.0–8.5)
eGFR: 70 mL/min/{1.73_m2} (ref >59)

## 2022-02-28 ENCOUNTER — Ambulatory Visit (INDEPENDENT_AMBULATORY_CARE_PROVIDER_SITE_OTHER): Payer: BC Managed Care – PPO | Admitting: Family Medicine

## 2022-02-28 ENCOUNTER — Encounter: Payer: Self-pay | Admitting: Family Medicine

## 2022-02-28 VITALS — BP 120/80 | HR 80 | Ht 63.5 in | Wt 219.0 lb

## 2022-02-28 DIAGNOSIS — Z23 Encounter for immunization: Secondary | ICD-10-CM

## 2022-02-28 DIAGNOSIS — L0100 Impetigo, unspecified: Secondary | ICD-10-CM | POA: Diagnosis not present

## 2022-02-28 DIAGNOSIS — B354 Tinea corporis: Secondary | ICD-10-CM | POA: Diagnosis not present

## 2022-02-28 DIAGNOSIS — L03115 Cellulitis of right lower limb: Secondary | ICD-10-CM | POA: Diagnosis not present

## 2022-02-28 MED ORDER — CLOTRIMAZOLE-BETAMETHASONE 1-0.05 % EX CREA: 1.0000 | TOPICAL_CREAM | Freq: Every day | CUTANEOUS | 0 refills | Status: DC

## 2022-02-28 MED ORDER — AMOXICILLIN-POT CLAVULANATE 875-125 MG PO TABS: 1.0000 | ORAL_TABLET | Freq: Two times a day (BID) | ORAL | 0 refills | Status: DC

## 2022-02-28 MED ORDER — DOXYCYCLINE HYCLATE 100 MG PO TABS: 100.0000 mg | ORAL_TABLET | Freq: Two times a day (BID) | ORAL | 0 refills | Status: DC

## 2022-02-28 NOTE — Progress Notes (Signed)
Date:  02/28/2022   Name:  Jaclyn Fields   DOB:  01/26/1971   MRN:  [id number]   Chief Complaint: Rash (Spot on R) leg has gotten bigger and is itching- hydrocortisone cream is not helping), nose sore (R) side of nose is sore inside), and Flu Vaccine  Rash This is a new problem. The current episode started 1 to 4 weeks ago. The problem has been rapidly improving since onset. The affected locations include the right lower leg. The rash is characterized by redness and itchiness. Pertinent negatives include no congestion, cough, diarrhea, fatigue, fever, rhinorrhea, shortness of breath or sore throat. Past treatments include topical steroids. The treatment provided no relief.    Lab Results  Component Value Date   NA 140 02/07/2022   K 4.0 02/07/2022   CO2 25 02/07/2022   GLUCOSE 108 (H) 02/07/2022   BUN 21 02/07/2022   CREATININE 0.98 02/07/2022   CALCIUM 9.5 02/07/2022   EGFR 70 02/07/2022   GFRNONAA >60 04/27/2020   Lab Results  Component Value Date   CHOL 198 02/07/2022   HDL 54 02/07/2022   LDLCALC 127 (H) 02/07/2022   TRIG 96 02/07/2022   CHOLHDL 3.2 04/12/2016   Lab Results  Component Value Date   TSH 1.73 03/03/2020   No results found for: "HGBA1C" Lab Results  Component Value Date   WBC 11.9 (H) 07/24/2020   HGB 11.6 (L) 07/24/2020   HCT 36.5 07/24/2020   MCV 85.9 07/24/2020   PLT 494 (H) 07/24/2020   Lab Results  Component Value Date   ALT 9 02/07/2022   AST 10 02/07/2022   ALKPHOS 111 02/07/2022   BILITOT 0.3 02/07/2022   Lab Results  Component Value Date   VD25OH 20 (L) 01/12/2019     Review of Systems  Constitutional: Negative.  Negative for chills, fatigue, fever and unexpected weight change.  HENT:  Negative for congestion, ear discharge, ear pain, rhinorrhea, sinus pressure, sneezing and sore throat.   Respiratory:  Negative for cough, shortness of breath, wheezing and stridor.   Gastrointestinal:  Negative for abdominal pain, blood in  stool, constipation, diarrhea and nausea.  Genitourinary:  Negative for dysuria, flank pain, frequency, hematuria, urgency and vaginal discharge.  Musculoskeletal:  Negative for arthralgias, back pain and myalgias.  Skin:  Positive for rash.  Neurological:  Negative for dizziness, weakness and headaches.  Hematological:  Negative for adenopathy. Does not bruise/bleed easily.  Psychiatric/Behavioral:  Negative for dysphoric mood. The patient is not nervous/anxious.     Patient Active Problem List   Diagnosis Date Noted   Postoperative state 05/08/2020   Post-operative state 05/08/2020   Essential hypertension 01/02/2018   Transfusion history 08/12/2012   Postpartum hemorrhage 09/05/2011   PP care - s/p 1C/S 3/1 (breech, [facility]) 08/24/2011   Maternal iron deficiency anemia 08/24/2011   Benign essential hypertension complicating pregnancy, childbirth, and the puerperium 08/23/2011    Allergies  Allergen Reactions   Sulfa Antibiotics Swelling and Rash    SWELLING TO FINGERS    Past Surgical History:  Procedure Laterality Date   AUGMENTATION MAMMAPLASTY Bilateral 2005   BARTHOLIN CYST MARSUPIALIZATION Left 05/08/2020   Procedure: BARTHOLIN CYST MARSUPIALIZATION;  Surgeon: [name], MD;  Location: [facility];  Service: Gynecology;  Laterality: Left;   CESAREAN SECTION     COLONOSCOPY  2018   DILATION AND EVACUATION  09/05/2011   Procedure: DILATATION AND EVACUATION;  Surgeon: Elveria Royals, MD;  Location: [facility];  Service: Gynecology;  Laterality: Bilateral;   ROBOTIC ASSISTED SUPRACERVICAL HYSTERECTOMY WITH BILATERAL SALPINGO OOPHERECTOMY Bilateral 05/08/2020   Procedure: XI ROBOTIC ASSISTED SUPRACERVICAL HYSTERECTOMY WITH BILATERAL SALPINGECTOMY;  Surgeon: [name], MD;  Location: [facility];  Service: Gynecology;  Laterality: Bilateral;   TUBAL LIGATION     WISDOM TOOTH EXTRACTION      Social History   Tobacco Use   Smoking  status: Never   Smokeless tobacco: Never  Vaping Use   Vaping Use: Never used  Substance Use Topics   Alcohol use: Yes    Comment: occasionally   Drug use: No     Medication list has been reviewed and updated.  Current Meds  Medication Sig   albuterol (VENTOLIN HFA) 108 (90 Base) MCG/ACT inhaler Inhale 2 puffs into the lungs every 6 (six) hours as needed for wheezing or shortness of breath.   amLODipine (NORVASC) 2.5 MG tablet Take 1 tablet (2.5 mg total) by mouth daily.   Ascorbic Acid (VITAMIN C GUMMIE PO) Take 1 tablet by mouth daily.   Biotin w/ Vitamins C & E (HAIR/SKIN/NAILS PO) Take 2 tablets by mouth daily.   cetirizine (ZYRTEC) 10 MG tablet Take 1 tablet (10 mg total) by mouth daily. (Patient taking differently: Take 10 mg by mouth at bedtime.)   cholecalciferol (VITAMIN D3) 25 MCG (1000 UNIT) tablet Take 1,000 Units by mouth daily.   hydrochlorothiazide (HYDRODIURIL) 25 MG tablet Take 1 tablet (25 mg total) by mouth daily.   metoprolol succinate (TOPROL-XL) 100 MG 24 hr tablet TAKE 1 TABLET BY MOUTH DAILY. TAKE WITH OR IMMEDIATELY FOLLOWING A MEAL.   montelukast (SINGULAIR) 10 MG tablet TAKE 1 TABLET BY MOUTH EVERYDAY AT BEDTIME   Multiple Vitamins-Minerals (MULTIVITAMIN GUMMIES WOMENS PO) Take 2 tablets by mouth daily.   mupirocin nasal ointment (BACTROBAN) 2 % Place 1 application into the nose 2 (two) times daily. Use one-half of tube in each nostril twice daily for five (5) days. After application, press sides of nose together and gently massage.   Omega-3 Fatty Acids (FISH OIL PO) Take 1 capsule by mouth daily.   triamcinolone (NASACORT) 55 MCG/ACT AERO nasal inhaler Place 2 sprays into the nose daily.   valACYclovir (VALTREX) 1000 MG tablet Take 1 tablet (1,000 mg total) by mouth as needed (for flare ups).       02/28/2022    9:14 AM 02/28/2022    9:08 AM 02/07/2022    9:33 AM 12/21/2021   11:30 AM  GAD 7 : Generalized Anxiety Score  Nervous, Anxious, on Edge 0 0 0 0   Control/stop worrying 0 0 0 0  Worry too much - different things 0 0 0 0  Trouble relaxing 0 0 0 0  Restless 0 0 0 0  Easily annoyed or irritable 0 0 0 0  Afraid - awful might happen 0 0 0 0  Total GAD 7 Score 0 0 0 0  Anxiety Difficulty Not difficult at all Not difficult at all Not difficult at all Not difficult at all       02/28/2022    9:14 AM 02/28/2022    9:08 AM 02/07/2022    9:33 AM  Depression screen PHQ 2/9  Decreased Interest 0 0 0  Down, Depressed, Hopeless 0 0 0  PHQ - 2 Score 0 0 0  Altered sleeping 0 0 0  Tired, decreased energy 0 0 0  Change in appetite 0 0 0  Feeling bad or failure about yourself  0 0 0  Trouble concentrating 0 0 0  Moving slowly or fidgety/restless 0 0 0  Suicidal thoughts 0 0 0  PHQ-9 Score 0 0 0  Difficult doing work/chores Not difficult at all Not difficult at all Not difficult at all    BP Readings from Last 3 Encounters:  02/28/22 120/80  02/07/22 138/88  12/21/21 (!) 148/100    Physical Exam Vitals and nursing note reviewed. Exam conducted with a chaperone present.  Constitutional:      General: She is not in acute distress.    Appearance: She is not diaphoretic.  HENT:     Head: Normocephalic and atraumatic.     Right Ear: Tympanic membrane and external ear normal.     Left Ear: Tympanic membrane and external ear normal.     Nose: Nasal tenderness present. No mucosal edema, congestion or rhinorrhea.     Right Nostril: No epistaxis.     Left Nostril: No epistaxis.     Right Turbinates: Not enlarged or swollen.     Left Turbinates: Not enlarged or swollen.     Comments: No crusting proximal nares    Mouth/Throat:     Mouth: Mucous membranes are moist.  Eyes:     General:        Right eye: No discharge.        Left eye: No discharge.     Conjunctiva/sclera: Conjunctivae normal.     Pupils: Pupils are equal, round, and reactive to light.  Neck:     Thyroid: No thyromegaly.     Vascular: No JVD.  Cardiovascular:     Rate  and Rhythm: Normal rate and regular rhythm.     Heart sounds: Normal heart sounds. No murmur heard.    No friction rub. No gallop.  Pulmonary:     Effort: Pulmonary effort is normal.     Breath sounds: Normal breath sounds. No wheezing or rhonchi.  Abdominal:     General: Bowel sounds are normal.     Palpations: Abdomen is soft. There is no mass.     Tenderness: There is no abdominal tenderness. There is no guarding.  Musculoskeletal:        General: Normal range of motion.     Cervical back: Normal range of motion and neck supple.  Lymphadenopathy:     Cervical: No cervical adenopathy.  Skin:    General: Skin is warm and dry.     Findings: Erythema and rash present. Rash is macular.          Comments: Circular with clearing center  Neurological:     Mental Status: She is alert.     Deep Tendon Reflexes: Reflexes are normal and symmetric.     Wt Readings from Last 3 Encounters:  02/28/22 219 lb (99.3 kg)  02/07/22 210 lb (95.3 kg)  12/21/21 217 lb (98.4 kg)    BP 120/80   Pulse 80   Ht 5' 3.5" (1.613 m)   Wt 219 lb (99.3 kg)   LMP 04/27/2020 Comment: Supracervical   BMI 38.19 kg/m   Assessment and Plan:  1. Cellulitis of right lower extremity New onset.  Stable.  Unresolved with topical steroid.  This has a circular appearance with some clearing in the center I did not detect any tick in the inner aspect.  This may be possible tick bite and I will treat with doxycycline when and earlier thinking Augmentin.  We will also use some Lotrisone twice twice a day  if unresolved and will recheck likely refer to dermatology. - clotrimazole-betamethasone (LOTRISONE) cream; Apply 1 Application topically daily.  Dispense: 30 g; Refill: 0 - doxycycline (VIBRA-TABS) 100 MG tablet; Take 1 tablet (100 mg total) by mouth 2 (two) times daily.  Dispense: 20 tablet; Refill: 0  2. Impetigo Patient feels an area in her nose that is consistent with given the past we will treat with  doxycycline this will be adequate for staph coverage and she has been encouraged to get some intranasal Neosporin to use in case there is a staph carrier circumstance to be used twice a week. - doxycycline (VIBRA-TABS) 100 MG tablet; Take 1 tablet (100 mg total) by mouth 2 (two) times daily.  Dispense: 20 tablet; Refill: 0  3. Tinea corporis Mild scaling and may be consistent with a tenia in addition to the antibiotic we will use Lotrisone cream twice a day for the antifungal effect. - clotrimazole-betamethasone (LOTRISONE) cream; Apply 1 Application topically daily.  Dispense: 30 g; Refill: 0  4. Need for immunization against influenza Discussed and administered - Flu Vaccine QUAD 71moIM (Fluarix, Fluzone & Alfiuria Quad PF)    DOtilio Miu MD

## 2022-03-01 ENCOUNTER — Telehealth: Payer: Self-pay | Admitting: Family Medicine

## 2022-03-01 NOTE — Telephone Encounter (Signed)
Call to patient- advised PCP/Tara busy with patients this morning- asked if there was any message or anything I could help her with now- Patient just request Baxter Flattery call her back.  Patient advised I would send request

## 2022-03-01 NOTE — Telephone Encounter (Signed)
Patient is calling to speak to Dr. Ronnald Ramp nurse about her appt yesterday. [phone]

## 2022-03-25 ENCOUNTER — Encounter: Payer: Self-pay | Admitting: Gastroenterology

## 2022-03-28 ENCOUNTER — Other Ambulatory Visit: Payer: Self-pay | Admitting: Obstetrics & Gynecology

## 2022-03-28 ENCOUNTER — Encounter: Payer: Self-pay | Admitting: Gastroenterology

## 2022-03-28 DIAGNOSIS — Z1231 Encounter for screening mammogram for malignant neoplasm of breast: Secondary | ICD-10-CM

## 2022-04-11 ENCOUNTER — Ambulatory Visit (AMBULATORY_SURGERY_CENTER): Payer: Self-pay

## 2022-04-11 VITALS — Ht 63.5 in | Wt 200.0 lb

## 2022-04-11 DIAGNOSIS — Z86010 Personal history of colonic polyps: Secondary | ICD-10-CM

## 2022-04-11 MED ORDER — NA SULFATE-K SULFATE-MG SULF 17.5-3.13-1.6 GM/177ML PO SOLN: 1.0000 | ORAL | 0 refills | Status: DC

## 2022-04-11 NOTE — Progress Notes (Signed)
No egg or soy allergy known to patient  No issues known to pt with past sedation with any surgeries or procedures Patient denies ever being told they had issues or difficulty with intubation  No FH of Malignant Hyperthermia Pt is not on diet pills Pt is not on  home 02  Pt is not on blood thinners  Pt denies issues with constipation  No A fib or A flutter Have any cardiac testing pending--denied Pt instructed to use [email or web address] or GoodRx for a price reduction on prep   PV conducted over phone as pt thought her appt was tomorrow. Suprep instructions reviewed and mailed with sample consent to verified address.  Patient's chart reviewed by Osvaldo Angst CNRA prior to previsit and patient appropriate for the [facility].  Previsit completed and red dot placed by patient's name on their procedure day (on provider's schedule).

## 2022-04-26 ENCOUNTER — Ambulatory Visit
Admission: RE | Admit: 2022-04-26 | Discharge: 2022-04-26 | Disposition: A | Discharge status: Home / Self Care | Payer: BC Managed Care – PPO | Source: Ambulatory Visit | Attending: Obstetrics & Gynecology | Admitting: Obstetrics & Gynecology

## 2022-04-26 DIAGNOSIS — Z1231 Encounter for screening mammogram for malignant neoplasm of breast: Secondary | ICD-10-CM | POA: Diagnosis not present

## 2022-05-06 ENCOUNTER — Ambulatory Visit (AMBULATORY_SURGERY_CENTER): Payer: BC Managed Care – PPO | Admitting: Gastroenterology

## 2022-05-06 ENCOUNTER — Encounter: Payer: Self-pay | Admitting: Gastroenterology

## 2022-05-06 VITALS — BP 169/82 | HR 71 | Temp 97.1°F | Resp 13 | Ht 63.5 in | Wt 200.0 lb

## 2022-05-06 DIAGNOSIS — Z86010 Personal history of colonic polyps: Secondary | ICD-10-CM

## 2022-05-06 DIAGNOSIS — K635 Polyp of colon: Secondary | ICD-10-CM | POA: Diagnosis not present

## 2022-05-06 DIAGNOSIS — Z1211 Encounter for screening for malignant neoplasm of colon: Secondary | ICD-10-CM | POA: Diagnosis not present

## 2022-05-06 DIAGNOSIS — Z09 Encounter for follow-up examination after completed treatment for conditions other than malignant neoplasm: Secondary | ICD-10-CM

## 2022-05-06 DIAGNOSIS — D123 Benign neoplasm of transverse colon: Secondary | ICD-10-CM

## 2022-05-06 MED ORDER — SODIUM CHLORIDE 0.9 % IV SOLN: 500.0000 mL | Freq: Once | INTRAVENOUS | Status: DC

## 2022-05-06 NOTE — Progress Notes (Signed)
Called to room to assist during endoscopic procedure.  Patient ID and intended procedure confirmed with present staff. Received instructions for my participation in the procedure from the performing physician.  

## 2022-05-06 NOTE — Op Note (Signed)
[place] Patient Name: Jaclyn Fields Procedure Date: 05/06/2022 8:38 AM MRN: [id number] Endoscopist: Mauri Pole , MD, [id number] Age: 51 Referring MD:  Date of Birth: 12-Jul-1970 Gender: Female Account #: [id number] Procedure:                Colonoscopy Indications:              High risk colon cancer surveillance: Personal                            history of colonic polyps, High risk colon cancer                            surveillance: Personal history of adenoma less than                            10 mm in size Medicines:                Monitored Anesthesia Care Procedure:                Pre-Anesthesia Assessment:                           - Prior to the procedure, a History and Physical                            was performed, and patient medications and                            allergies were reviewed. The patient's tolerance of                            previous anesthesia was also reviewed. The risks                            and benefits of the procedure and the sedation                            options and risks were discussed with the patient.                            All questions were answered, and informed consent                            was obtained. Prior Anticoagulants: The patient has                            taken no anticoagulant or antiplatelet agents. ASA                            Grade Assessment: III - A patient with severe                            systemic disease. After reviewing the risks and  benefits, the patient was deemed in satisfactory                            condition to undergo the procedure.                           After obtaining informed consent, the colonoscope                            was passed under direct vision. Throughout the                            procedure, the patient's blood pressure, pulse, and                            oxygen saturations were monitored  continuously. The                            Olympus PCF-H190DL ([id number]) Colonoscope was                            introduced through the anus and advanced to the the                            cecum, identified by appendiceal orifice and                            ileocecal valve. The colonoscopy was performed                            without difficulty. The patient tolerated the                            procedure well. The quality of the bowel                            preparation was excellent. The ileocecal valve,                            appendiceal orifice, and rectum were photographed. Scope In: 8:46:32 AM Scope Out: 8:59:51 AM Scope Withdrawal Time: 0 hours 8 minutes 32 seconds  Total Procedure Duration: 0 hours 13 minutes 19 seconds  Findings:                 The perianal and digital rectal examinations were                            normal.                           A 2 mm polyp was found in the transverse colon. The                            polyp was sessile. The polyp was removed with a  cold biopsy forceps. Resection and retrieval were                            complete.                           A few small-mouthed diverticula were found in the                            sigmoid colon.                           Non-bleeding external and internal hemorrhoids were                            found during retroflexion. The hemorrhoids were                            medium-sized. Complications:            No immediate complications. Estimated Blood Loss:     Estimated blood loss was minimal. Impression:               - One 2 mm polyp in the transverse colon, removed                            with a cold biopsy forceps. Resected and retrieved.                           - Diverticulosis in the sigmoid colon.                           - Non-bleeding external and internal hemorrhoids. Recommendation:           - Patient has a contact  number available for                            emergencies. The signs and symptoms of potential                            delayed complications were discussed with the                            patient. Return to normal activities tomorrow.                            Written discharge instructions were provided to the                            patient.                           - Resume previous diet.                           - Continue present medications.                           -  Await pathology results.                           - Repeat colonoscopy in 7-10 years for surveillance                            based on pathology results. Mauri Pole, MD 05/06/2022 9:08:34 AM This report has been signed electronically.

## 2022-05-06 NOTE — Progress Notes (Signed)
VS completed by DT.  Pt's states no medical or surgical changes since previsit or office visit.  

## 2022-05-06 NOTE — Progress Notes (Signed)
[facility] History and Physical   Primary Care Physician:  Juline Patch, MD   Reason for Procedure:  History of adenomatous colon polyps  Plan:    Surveillance colonoscopy with possible interventions as needed     HPI: Jaclyn Fields is a very pleasant 51 y.o. female here for surveillance colonoscopy. Denies any nausea, vomiting, abdominal pain, melena or bright red blood per rectum  The risks and benefits as well as alternatives of endoscopic procedure(s) have been discussed and reviewed. All questions answered. The patient agrees to proceed.    Past Medical History:  Diagnosis Date   Allergy    Bartholin's duct cyst    Bartholin duct cyst 2 left last one 2015   Family history of adverse reaction to anesthesia    N&V   HSV (herpes simplex virus) infection    oral cold sores   Hypertension    Maternal iron deficiency anemia 08/24/2011   PP care - s/p 1C/S (breech, [facility]) 08/24/2011    Past Surgical History:  Procedure Laterality Date   AUGMENTATION MAMMAPLASTY Bilateral 2005   BARTHOLIN CYST MARSUPIALIZATION Left 05/08/2020   Procedure: BARTHOLIN CYST MARSUPIALIZATION;  Surgeon: [name], MD;  Location: [facility];  Service: Gynecology;  Laterality: Left;   CESAREAN SECTION     COLONOSCOPY  2018   DILATION AND EVACUATION  09/05/2011   Procedure: DILATATION AND EVACUATION;  Surgeon: Elveria Royals, MD;  Location: [facility];  Service: Gynecology;  Laterality: Bilateral;   ROBOTIC ASSISTED SUPRACERVICAL HYSTERECTOMY WITH BILATERAL SALPINGO OOPHERECTOMY Bilateral 05/08/2020   Procedure: XI ROBOTIC ASSISTED SUPRACERVICAL HYSTERECTOMY WITH BILATERAL SALPINGECTOMY;  Surgeon: [name], MD;  Location: [facility];  Service: Gynecology;  Laterality: Bilateral;   TUBAL LIGATION     WISDOM TOOTH EXTRACTION      Prior to Admission medications   Medication Sig Start Date End Date Taking? Authorizing Provider   amLODipine (NORVASC) 2.5 MG tablet Take 1 tablet (2.5 mg total) by mouth daily. 02/07/22  Yes Juline Patch, MD  cetirizine (ZYRTEC) 10 MG tablet Take 1 tablet (10 mg total) by mouth daily. Patient taking differently: Take 10 mg by mouth at bedtime. 10/11/16  Yes Juline Patch, MD  hydrochlorothiazide (HYDRODIURIL) 25 MG tablet Take 1 tablet (25 mg total) by mouth daily. 02/07/22  Yes Juline Patch, MD  metoprolol succinate (TOPROL-XL) 100 MG 24 hr tablet TAKE 1 TABLET BY MOUTH DAILY. TAKE WITH OR IMMEDIATELY FOLLOWING A MEAL. 02/07/22  Yes Juline Patch, MD  montelukast (SINGULAIR) 10 MG tablet TAKE 1 TABLET BY MOUTH EVERYDAY AT BEDTIME 12/21/21  Yes Juline Patch, MD  triamcinolone (NASACORT) 55 MCG/ACT AERO nasal inhaler Place 2 sprays into the nose daily. 12/21/21  Yes Juline Patch, MD  albuterol (VENTOLIN HFA) 108 (90 Base) MCG/ACT inhaler Inhale 2 puffs into the lungs every 6 (six) hours as needed for wheezing or shortness of breath. 12/21/21   Juline Patch, MD  Ascorbic Acid (VITAMIN C GUMMIE PO) Take 1 tablet by mouth daily.    [provider]  Biotin w/ Vitamins C & E (HAIR/SKIN/NAILS PO) Take 2 tablets by mouth daily.    [provider]  cholecalciferol (VITAMIN D3) 25 MCG (1000 UNIT) tablet Take 1,000 Units by mouth daily.    [provider]  Multiple Vitamins-Minerals (MULTIVITAMIN GUMMIES WOMENS PO) Take 2 tablets by mouth daily.    [provider]  mupirocin nasal ointment (BACTROBAN) 2 % Place 1  application into the nose 2 (two) times daily. Use one-half of tube in each nostril twice daily for five (5) days. After application, press sides of nose together and gently massage. 06/05/21   Juline Patch, MD  Omega-3 Fatty Acids (FISH OIL PO) Take 1 capsule by mouth daily.    [provider]  valACYclovir (VALTREX) 1000 MG tablet Take 1 tablet (1,000 mg total) by mouth as needed (for flare ups). 12/21/21   Juline Patch, MD     Current Outpatient Medications  Medication Sig Dispense Refill   amLODipine (NORVASC) 2.5 MG tablet Take 1 tablet (2.5 mg total) by mouth daily. 90 tablet 1   cetirizine (ZYRTEC) 10 MG tablet Take 1 tablet (10 mg total) by mouth daily. (Patient taking differently: Take 10 mg by mouth at bedtime.) 90 tablet 3   hydrochlorothiazide (HYDRODIURIL) 25 MG tablet Take 1 tablet (25 mg total) by mouth daily. 90 tablet 1   metoprolol succinate (TOPROL-XL) 100 MG 24 hr tablet TAKE 1 TABLET BY MOUTH DAILY. TAKE WITH OR IMMEDIATELY FOLLOWING A MEAL. 90 tablet 1   montelukast (SINGULAIR) 10 MG tablet TAKE 1 TABLET BY MOUTH EVERYDAY AT BEDTIME 90 tablet 1   triamcinolone (NASACORT) 55 MCG/ACT AERO nasal inhaler Place 2 sprays into the nose daily. 1 each 12   albuterol (VENTOLIN HFA) 108 (90 Base) MCG/ACT inhaler Inhale 2 puffs into the lungs every 6 (six) hours as needed for wheezing or shortness of breath. 8 g 2   Ascorbic Acid (VITAMIN C GUMMIE PO) Take 1 tablet by mouth daily.     Biotin w/ Vitamins C & E (HAIR/SKIN/NAILS PO) Take 2 tablets by mouth daily.     cholecalciferol (VITAMIN D3) 25 MCG (1000 UNIT) tablet Take 1,000 Units by mouth daily.     Multiple Vitamins-Minerals (MULTIVITAMIN GUMMIES WOMENS PO) Take 2 tablets by mouth daily.     mupirocin nasal ointment (BACTROBAN) 2 % Place 1 application into the nose 2 (two) times daily. Use one-half of tube in each nostril twice daily for five (5) days. After application, press sides of nose together and gently massage. 10 g 0   Omega-3 Fatty Acids (FISH OIL PO) Take 1 capsule by mouth daily.     valACYclovir (VALTREX) 1000 MG tablet Take 1 tablet (1,000 mg total) by mouth as needed (for flare ups). 10 tablet 5   Current Facility-Administered Medications  Medication Dose Route Frequency Provider Last Rate Last Admin   0.9 %  sodium chloride infusion  500 mL Intravenous Once Mauri Pole, MD        Allergies as of 05/06/2022 - Review Complete  05/06/2022  Allergen Reaction Noted   Sulfa antibiotics Swelling and Rash     Family History  Problem Relation Age of Onset   Colon polyps Mother    Hypertension Mother    Hypertension Father    Diabetes Father    Colon cancer Neg Hx    Esophageal cancer Neg Hx    Rectal cancer Neg Hx    Stomach cancer Neg Hx     Social History   Socioeconomic History   Marital status: Married    Spouse name: Not on file   Number of children: Not on file   Years of education: Not on file   Highest education level: Not on file  Occupational History   Not on file  Tobacco Use   Smoking status: Never   Smokeless tobacco: Never  Vaping Use   Vaping  Use: Never used  Substance and Sexual Activity   Alcohol use: Yes    Comment: occasionally   Drug use: No   Sexual activity: Yes    Partners: Male    Birth control/protection: Surgical    Comment: tubal ligation, hysterectomy (supracervical)  Other Topics Concern   Not on file  Social History Narrative   Not on file   Social Determinants of Health   Financial Resource Strain: Not on file  Food Insecurity: Not on file  Transportation Needs: Not on file  Physical Activity: Not on file  Stress: Not on file  Social Connections: Not on file  Intimate Partner Violence: Not on file    Review of Systems:  All other review of systems negative except as mentioned in the HPI.  Physical Exam: Vital signs in last 24 hours: Blood Pressure (Abnormal) 147/75   Pulse 70   Temperature (Abnormal) 97.1 F (36.2 C) (Temporal)   Height 5' 3.5" (1.613 m)   Weight 200 lb (90.7 kg)   Last Menstrual Period 04/27/2020 Comment: Supracervical   Oxygen Saturation 100%   Body Mass Index 34.87 kg/m  General:   Alert, NAD Lungs:  Clear .   Heart:  Regular rate and rhythm Abdomen:  Soft, nontender and nondistended. Neuro/Psych:  Alert and cooperative. Normal mood and affect. A and O x 3  Reviewed labs, radiology imaging, old records and pertinent  past GI work up  Patient is appropriate for planned procedure(s) and anesthesia in an ambulatory setting   K. Denzil Magnuson , MD [phone]

## 2022-05-06 NOTE — Progress Notes (Signed)
Pt resting comfortably. VSS. Airway intact. SBAR complete to RN. All questions answered.   

## 2022-05-06 NOTE — Patient Instructions (Addendum)
-   Patient has a contact number available for emergencies. The signs and symptoms of potential delayed complications were discussed with the patient. Return to normal activities tomorrow. Written discharge instructions were provided to the patient. - Resume previous diet. - Continue present medications.  Handouts on polyps, diverticulosis and hemorrhoids given.  YOU HAD AN ENDOSCOPIC PROCEDURE TODAY AT [facility]:   Refer to the procedure report that was given to you for any specific questions about what was found during the examination.  If the procedure report does not answer your questions, please call your gastroenterologist to clarify.  If you requested that your care partner not be given the details of your procedure findings, then the procedure report has been included in a sealed envelope for you to review at your convenience later.  YOU SHOULD EXPECT: Some feelings of bloating in the abdomen. Passage of more gas than usual.  Walking can help get rid of the air that was put into your GI tract during the procedure and reduce the bloating. If you had a lower endoscopy (such as a colonoscopy or flexible sigmoidoscopy) you may notice spotting of blood in your stool or on the toilet paper. If you underwent a bowel prep for your procedure, you may not have a normal bowel movement for a few days.  Please Note:  You might notice some irritation and congestion in your nose or some drainage.  This is from the oxygen used during your procedure.  There is no need for concern and it should clear up in a day or so.  SYMPTOMS TO REPORT IMMEDIATELY:  Following lower endoscopy (colonoscopy or flexible sigmoidoscopy):  Excessive amounts of blood in the stool  Significant tenderness or worsening of abdominal pains  Swelling of the abdomen that is new, acute  Fever of 100F or higher  For urgent or emergent issues, a gastroenterologist can be reached at any hour by calling [phone]  [phone]. Do not use MyChart messaging for urgent concerns.    DIET:  We do recommend a small meal at first, but then you may proceed to your regular diet.  Drink plenty of fluids but you should avoid alcoholic beverages for 24 hours.  ACTIVITY:  You should plan to take it easy for the rest of today and you should NOT DRIVE or use heavy machinery until tomorrow (because of the sedation medicines used during the test).    FOLLOW UP: Our staff will call the number listed on your records the next business day following your procedure.  We will call around 7:15- 8:00 am to check on you and address any questions or concerns that you may have regarding the information given to you following your procedure. If we do not reach you, we will leave a message.     If any biopsies were taken you will be contacted by phone or by letter within the next 1-3 weeks.  Please call us at [phone] if you have not heard about the biopsies in 3 weeks.    SIGNATURES/CONFIDENTIALITY: You and/or your care partner have signed paperwork which will be entered into your electronic medical record.  These signatures attest to the fact that that the information above on your After Visit Summary has been reviewed and is understood.  Full responsibility of the confidentiality of this discharge information lies with you and/or your care-partner.

## 2022-05-07 ENCOUNTER — Telehealth: Payer: Self-pay

## 2022-05-07 NOTE — Telephone Encounter (Signed)
  Follow up Call-     05/06/2022    8:24 AM  Call back number  Post procedure Call Back phone  # [phone]  Permission to leave phone message Yes     Patient questions:  Do you have a fever, pain , or abdominal swelling? No. Pain Score  0 *  Have you tolerated food without any problems? Yes.    Have you been able to return to your normal activities? Yes.    Do you have any questions about your discharge instructions: Diet   No. Medications  No. Follow up visit  No.  Do you have questions or concerns about your Care? No.  Actions: * If pain score is 4 or above: No action needed, pain <4.

## 2022-05-14 ENCOUNTER — Other Ambulatory Visit: Payer: Self-pay | Admitting: Obstetrics & Gynecology

## 2022-05-14 ENCOUNTER — Ambulatory Visit: Payer: BC Managed Care – PPO | Admitting: Obstetrics & Gynecology

## 2022-05-14 DIAGNOSIS — Z1231 Encounter for screening mammogram for malignant neoplasm of breast: Secondary | ICD-10-CM

## 2022-05-23 ENCOUNTER — Telehealth: Payer: Self-pay | Admitting: Family Medicine

## 2022-05-23 NOTE — Telephone Encounter (Signed)
Copied from [place] [phone]. Topic: General - Other >> May 23, 2022 11:01 AM Chapman Fitch wrote: Reason for CRM: Pt asked for a call from [place]

## 2022-05-28 ENCOUNTER — Encounter: Payer: Self-pay | Admitting: Gastroenterology

## 2022-05-28 ENCOUNTER — Encounter: Payer: Self-pay | Admitting: Family Medicine

## 2022-05-28 ENCOUNTER — Ambulatory Visit (INDEPENDENT_AMBULATORY_CARE_PROVIDER_SITE_OTHER): Payer: BC Managed Care – PPO | Admitting: Family Medicine

## 2022-05-28 VITALS — BP 120/78 | HR 73 | Ht 63.5 in | Wt 224.0 lb

## 2022-05-28 DIAGNOSIS — L2084 Intrinsic (allergic) eczema: Secondary | ICD-10-CM | POA: Diagnosis not present

## 2022-05-28 DIAGNOSIS — L03012 Cellulitis of left finger: Secondary | ICD-10-CM | POA: Diagnosis not present

## 2022-05-28 DIAGNOSIS — M67449 Ganglion, unspecified hand: Secondary | ICD-10-CM | POA: Diagnosis not present

## 2022-05-28 DIAGNOSIS — Z23 Encounter for immunization: Secondary | ICD-10-CM

## 2022-05-28 MED ORDER — TRIAMCINOLONE ACETONIDE 0.1 % EX CREA: 1.0000 | TOPICAL_CREAM | Freq: Two times a day (BID) | CUTANEOUS | 2 refills | Status: AC

## 2022-05-28 MED ORDER — DOXYCYCLINE HYCLATE 100 MG PO TABS: 100.0000 mg | ORAL_TABLET | Freq: Two times a day (BID) | ORAL | 0 refills | Status: DC

## 2022-05-28 NOTE — Progress Notes (Signed)
Date:  05/28/2022   Name:  Jaclyn Fields   DOB:  [birth year]   MRN:  [id number]   Chief Complaint: Rash (Lower extremities- was treated with doxy and triamcinolone cream in Sept- was good until November and came back. Has appt with derm in Feb) and Covid Vaccine   Rash This is a chronic problem. The current episode started more than 1 month ago. Progression since onset: 3-4 month. The affected locations include the right lower leg and left upper leg. The rash is characterized by redness (left middle finger). Pertinent negatives include no fever. Past treatments include antihistamine and topical steroids. The treatment provided moderate relief.    Lab Results  Component Value Date   NA 140 02/07/2022   K 4.0 02/07/2022   CO2 25 02/07/2022   GLUCOSE 108 (H) 02/07/2022   BUN 21 02/07/2022   CREATININE 0.98 02/07/2022   CALCIUM 9.5 02/07/2022   EGFR 70 02/07/2022   GFRNONAA >60 04/27/2020   Lab Results  Component Value Date   CHOL 198 02/07/2022   HDL 54 02/07/2022   LDLCALC 127 (H) 02/07/2022   TRIG 96 02/07/2022   CHOLHDL 3.2 04/12/2016   Lab Results  Component Value Date   TSH 1.73 03/03/2020   No results found for: "HGBA1C" Lab Results  Component Value Date   WBC 11.9 (H) 07/24/2020   HGB 11.6 (L) 07/24/2020   HCT 36.5 07/24/2020   MCV 85.9 07/24/2020   PLT 494 (H) 07/24/2020   Lab Results  Component Value Date   ALT 9 02/07/2022   AST 10 02/07/2022   ALKPHOS 111 02/07/2022   BILITOT 0.3 02/07/2022   Lab Results  Component Value Date   VD25OH 20 (L) 01/12/2019     Review of Systems  Constitutional:  Negative for fever.  Skin:  Positive for rash.    Patient Active Problem List   Diagnosis Date Noted   Postoperative state 05/08/2020   Post-operative state 05/08/2020   Essential hypertension 01/02/2018   Transfusion history 08/12/2012   Postpartum hemorrhage 09/05/2011   PP care - s/p 1C/S 3/1 (breech, [facility]) 08/24/2011   Maternal iron deficiency  anemia 08/24/2011   Benign essential hypertension complicating pregnancy, childbirth, and the puerperium 08/23/2011    Allergies  Allergen Reactions   Sulfa Antibiotics Swelling and Rash    SWELLING TO FINGERS    Past Surgical History:  Procedure Laterality Date   AUGMENTATION MAMMAPLASTY Bilateral 2005   BARTHOLIN CYST MARSUPIALIZATION Left 05/08/2020   Procedure: BARTHOLIN CYST MARSUPIALIZATION;  Surgeon: [name], MD;  Location: [facility];  Service: Gynecology;  Laterality: Left;   CESAREAN SECTION     COLONOSCOPY  2018   DILATION AND EVACUATION  09/05/2011   Procedure: DILATATION AND EVACUATION;  Surgeon: Elveria Royals, MD;  Location: [facility];  Service: Gynecology;  Laterality: Bilateral;   ROBOTIC ASSISTED SUPRACERVICAL HYSTERECTOMY WITH BILATERAL SALPINGO OOPHERECTOMY Bilateral 05/08/2020   Procedure: XI ROBOTIC ASSISTED SUPRACERVICAL HYSTERECTOMY WITH BILATERAL SALPINGECTOMY;  Surgeon: [name], MD;  Location: [facility];  Service: Gynecology;  Laterality: Bilateral;   TUBAL LIGATION     WISDOM TOOTH EXTRACTION      Social History   Tobacco Use   Smoking status: Never   Smokeless tobacco: Never  Vaping Use   Vaping Use: Never used  Substance Use Topics   Alcohol use: Yes    Comment: occasionally   Drug use: No     Medication list has been  reviewed and updated.  Current Meds  Medication Sig   albuterol (VENTOLIN HFA) 108 (90 Base) MCG/ACT inhaler Inhale 2 puffs into the lungs every 6 (six) hours as needed for wheezing or shortness of breath.   amLODipine (NORVASC) 2.5 MG tablet Take 1 tablet (2.5 mg total) by mouth daily.   Ascorbic Acid (VITAMIN C GUMMIE PO) Take 1 tablet by mouth daily.   Biotin w/ Vitamins C & E (HAIR/SKIN/NAILS PO) Take 2 tablets by mouth daily.   cetirizine (ZYRTEC) 10 MG tablet Take 1 tablet (10 mg total) by mouth daily. (Patient taking differently: Take 10 mg by mouth at bedtime.)    cholecalciferol (VITAMIN D3) 25 MCG (1000 UNIT) tablet Take 1,000 Units by mouth daily.   hydrochlorothiazide (HYDRODIURIL) 25 MG tablet Take 1 tablet (25 mg total) by mouth daily.   metoprolol succinate (TOPROL-XL) 100 MG 24 hr tablet TAKE 1 TABLET BY MOUTH DAILY. TAKE WITH OR IMMEDIATELY FOLLOWING A MEAL.   montelukast (SINGULAIR) 10 MG tablet TAKE 1 TABLET BY MOUTH EVERYDAY AT BEDTIME   Multiple Vitamins-Minerals (MULTIVITAMIN GUMMIES WOMENS PO) Take 2 tablets by mouth daily.   mupirocin nasal ointment (BACTROBAN) 2 % Place 1 application into the nose 2 (two) times daily. Use one-half of tube in each nostril twice daily for five (5) days. After application, press sides of nose together and gently massage.   Omega-3 Fatty Acids (FISH OIL PO) Take 1 capsule by mouth daily.   triamcinolone (NASACORT) 55 MCG/ACT AERO nasal inhaler Place 2 sprays into the nose daily.   valACYclovir (VALTREX) 1000 MG tablet Take 1 tablet (1,000 mg total) by mouth as needed (for flare ups).       05/28/2022    8:49 AM 02/28/2022    9:14 AM 02/28/2022    9:08 AM 02/07/2022    9:33 AM  GAD 7 : Generalized Anxiety Score  Nervous, Anxious, on Edge 0 0 0 0  Control/stop worrying 0 0 0 0  Worry too much - different things 0 0 0 0  Trouble relaxing 0 0 0 0  Restless 0 0 0 0  Easily annoyed or irritable 0 0 0 0  Afraid - awful might happen 0 0 0 0  Total GAD 7 Score 0 0 0 0  Anxiety Difficulty Not difficult at all Not difficult at all Not difficult at all Not difficult at all       05/28/2022    8:49 AM 02/28/2022    9:14 AM 02/28/2022    9:08 AM  Depression screen PHQ 2/9  Decreased Interest 0 0 0  Down, Depressed, Hopeless 0 0 0  PHQ - 2 Score 0 0 0  Altered sleeping 0 0 0  Tired, decreased energy 0 0 0  Change in appetite 0 0 0  Feeling bad or failure about yourself  0 0 0  Trouble concentrating 0 0 0  Moving slowly or fidgety/restless 0 0 0  Suicidal thoughts 0 0 0  PHQ-9 Score 0 0 0  Difficult doing  work/chores Not difficult at all Not difficult at all Not difficult at all    BP Readings from Last 3 Encounters:  05/28/22 120/78  05/06/22 (!) 169/82  02/28/22 120/80    Physical Exam Vitals and nursing note reviewed. Exam conducted with a chaperone present.  Constitutional:      General: She is not in acute distress.    Appearance: She is not diaphoretic.  HENT:     Head: Normocephalic and atraumatic.  Right Ear: External ear normal.     Left Ear: External ear normal.     Nose: Nose normal.  Eyes:     General:        Right eye: No discharge.        Left eye: No discharge.     Conjunctiva/sclera: Conjunctivae normal.     Pupils: Pupils are equal, round, and reactive to light.  Neck:     Thyroid: No thyromegaly.     Vascular: No JVD.  Cardiovascular:     Rate and Rhythm: Normal rate and regular rhythm.     Heart sounds: Normal heart sounds. No murmur heard.    No friction rub. No gallop.  Pulmonary:     Effort: Pulmonary effort is normal.     Breath sounds: Normal breath sounds.  Abdominal:     General: Bowel sounds are normal.     Palpations: Abdomen is soft. There is no mass.     Tenderness: There is no abdominal tenderness. There is no guarding.  Musculoskeletal:        General: Normal range of motion.     Cervical back: Normal range of motion and neck supple.  Lymphadenopathy:     Cervical: No cervical adenopathy.  Skin:    General: Skin is warm and dry.     Findings: Erythema and rash present.     Comments: Extensor surfaces bilateral lower legs/middle finger left hand with a mucinous cyst with erythema because of patient manipulation.  Secondarily infected probable.  Neurological:     Mental Status: She is alert.     Deep Tendon Reflexes: Reflexes are normal and symmetric.     Wt Readings from Last 3 Encounters:  05/28/22 224 lb (101.6 kg)  05/06/22 200 lb (90.7 kg)  04/11/22 200 lb (90.7 kg)    BP 120/78   Pulse 73   Ht 5' 3.5" (1.613 m)   Wt  224 lb (101.6 kg)   LMP 04/27/2020 Comment: Supracervical   SpO2 97%   BMI 39.06 kg/m   Assessment and Plan:  1. Intrinsic atopic dermatitis Patient has extensor surfaces with some desquamation and small vesicles that may erupt before desquamation.  This is consistent with atopic dermatitis and we will treat with Kenalog 0.1% cream twice a day. - triamcinolone cream (KENALOG) 0.1 %; Apply 1 Application topically 2 (two) times daily.  Dispense: 30 g; Refill: 2  2. Digital mucinous cyst Patient has had a small digital mucinous cyst that she began to manipulate by squeezing it and even contemplating sticking it with a personal needle which I strongly discouraged.  She is got a secondary infection of the mucinous cyst and we will treat this with doxycycline 100 mg twice a day and patient has an appointment with dermatology to discuss excision.  3. Cellulitis of finger of left hand As noted above.  Because of self manipulation patient has developed a secondary infection of a mucinous cyst of the left middle finger.  Patient will cease manipulating this area and will take doxycycline and keep the area clean until dermatology appointment. - doxycycline (VIBRA-TABS) 100 MG tablet; Take 1 tablet (100 mg total) by mouth 2 (two) times daily.  Dispense: 20 tablet; Refill: 0  4. Need for COVID-19 vaccine Discussed and administered [employer] Fall 2023 Covid-19 Vaccine 21yr and older    DOtilio Miu MD

## 2022-05-30 DIAGNOSIS — M71342 Other bursal cyst, left hand: Secondary | ICD-10-CM | POA: Diagnosis not present

## 2022-06-26 ENCOUNTER — Ambulatory Visit (INDEPENDENT_AMBULATORY_CARE_PROVIDER_SITE_OTHER): Payer: BC Managed Care – PPO | Admitting: Family Medicine

## 2022-06-26 ENCOUNTER — Encounter: Payer: Self-pay | Admitting: Family Medicine

## 2022-06-26 ENCOUNTER — Telehealth: Payer: Self-pay | Admitting: Family Medicine

## 2022-06-26 VITALS — BP 140/98 | HR 72 | Ht 63.5 in | Wt 224.0 lb

## 2022-06-26 DIAGNOSIS — U099 Post covid-19 condition, unspecified: Secondary | ICD-10-CM

## 2022-06-26 DIAGNOSIS — R053 Chronic cough: Secondary | ICD-10-CM | POA: Diagnosis not present

## 2022-06-26 DIAGNOSIS — J4520 Mild intermittent asthma, uncomplicated: Secondary | ICD-10-CM

## 2022-06-26 DIAGNOSIS — J301 Allergic rhinitis due to pollen: Secondary | ICD-10-CM

## 2022-06-26 DIAGNOSIS — B009 Herpesviral infection, unspecified: Secondary | ICD-10-CM

## 2022-06-26 DIAGNOSIS — I10 Essential (primary) hypertension: Secondary | ICD-10-CM | POA: Diagnosis not present

## 2022-06-26 MED ORDER — AMLODIPINE BESYLATE 5 MG PO TABS: 5.0000 mg | ORAL_TABLET | Freq: Every day | ORAL | 0 refills | Status: DC

## 2022-06-26 MED ORDER — METOPROLOL SUCCINATE ER 100 MG PO TB24: ORAL_TABLET | ORAL | 1 refills | Status: DC

## 2022-06-26 MED ORDER — ALBUTEROL SULFATE HFA 108 (90 BASE) MCG/ACT IN AERS: 2.0000 | INHALATION_SPRAY | Freq: Four times a day (QID) | RESPIRATORY_TRACT | 2 refills | Status: AC | PRN

## 2022-06-26 MED ORDER — MONTELUKAST SODIUM 10 MG PO TABS: ORAL_TABLET | ORAL | 1 refills | Status: DC

## 2022-06-26 MED ORDER — VALACYCLOVIR HCL 1 G PO TABS: 1000.0000 mg | ORAL_TABLET | ORAL | 5 refills | Status: DC | PRN

## 2022-06-26 MED ORDER — HYDROCHLOROTHIAZIDE 25 MG PO TABS: 25.0000 mg | ORAL_TABLET | Freq: Every day | ORAL | 1 refills | Status: DC

## 2022-06-26 NOTE — Telephone Encounter (Signed)
Copied from [place] [id number]. Topic: General - Other >> Jun 26, 2022 10:30 AM [place] B wrote: Reason for CRM: Patient called in asking to speak with Baxter Flattery about her appt she had today. She wouldn't go into details.

## 2022-06-26 NOTE — Patient Instructions (Signed)

## 2022-06-26 NOTE — Progress Notes (Signed)
Date:  06/26/2022   Name:  Jaclyn Fields   DOB:  [birth year]   MRN:  [id number]   Chief Complaint: Hypertension, Allergic Rhinitis , and Asthma  Hypertension This is a chronic problem. The current episode started more than 1 year ago. The problem is unchanged. The problem is uncontrolled. Pertinent negatives include no chest pain, headaches, neck pain, orthopnea, palpitations, peripheral edema, PND or shortness of breath. There are no associated agents to hypertension. Risk factors for coronary artery disease include dyslipidemia. Past treatments include diuretics and calcium channel blockers. The current treatment provides moderate improvement. There are no compliance problems.  There is no history of angina, kidney disease, CAD/MI, CVA, heart failure, left ventricular hypertrophy, PVD or retinopathy. There is no history of chronic renal disease, a hypertension causing med or renovascular disease.  Asthma There is no chest tightness, difficulty breathing, shortness of breath or wheezing. This is a chronic problem. The current episode started more than 1 year ago. The problem has been gradually improving. Pertinent negatives include no chest pain, ear pain, fever, headaches, myalgias, PND or trouble swallowing. Her symptoms are alleviated by beta-agonist. She reports moderate improvement on treatment. Her past medical history is significant for asthma.    Lab Results  Component Value Date   NA 140 02/07/2022   K 4.0 02/07/2022   CO2 25 02/07/2022   GLUCOSE 108 (H) 02/07/2022   BUN 21 02/07/2022   CREATININE 0.98 02/07/2022   CALCIUM 9.5 02/07/2022   EGFR 70 02/07/2022   GFRNONAA >60 04/27/2020   Lab Results  Component Value Date   CHOL 198 02/07/2022   HDL 54 02/07/2022   LDLCALC 127 (H) 02/07/2022   TRIG 96 02/07/2022   CHOLHDL 3.2 04/12/2016   Lab Results  Component Value Date   TSH 1.73 03/03/2020   No results found for: "HGBA1C" Lab Results  Component Value Date   WBC  11.9 (H) 07/24/2020   HGB 11.6 (L) 07/24/2020   HCT 36.5 07/24/2020   MCV 85.9 07/24/2020   PLT 494 (H) 07/24/2020   Lab Results  Component Value Date   ALT 9 02/07/2022   AST 10 02/07/2022   ALKPHOS 111 02/07/2022   BILITOT 0.3 02/07/2022   Lab Results  Component Value Date   VD25OH 20 (L) 01/12/2019     Review of Systems  Constitutional:  Negative for chills, fever and unexpected weight change.  HENT:  Negative for ear discharge, ear pain and trouble swallowing.   Respiratory:  Negative for shortness of breath and wheezing.   Cardiovascular:  Negative for chest pain, palpitations, orthopnea, leg swelling and PND.  Gastrointestinal:  Negative for abdominal pain and blood in stool.  Endocrine: Negative for polydipsia and polyuria.  Genitourinary:  Negative for dysuria, frequency, hematuria and urgency.  Musculoskeletal:  Negative for back pain, myalgias and neck pain.  Skin:  Negative for rash.  Allergic/Immunologic: Negative for environmental allergies.  Neurological:  Negative for dizziness and headaches.  Hematological:  Negative for adenopathy. Does not bruise/bleed easily.  Psychiatric/Behavioral:  Negative for suicidal ideas. The patient is not nervous/anxious.     Patient Active Problem List   Diagnosis Date Noted   Postoperative state 05/08/2020   Post-operative state 05/08/2020   Essential hypertension 01/02/2018   Transfusion history 08/12/2012   Postpartum hemorrhage 09/05/2011   PP care - s/p 1C/S 3/1 (breech, [facility]) 08/24/2011   Maternal iron deficiency anemia 08/24/2011   Benign essential hypertension complicating pregnancy, childbirth, and the puerperium  08/23/2011    Allergies  Allergen Reactions   Sulfa Antibiotics Swelling and Rash    SWELLING TO FINGERS    Past Surgical History:  Procedure Laterality Date   AUGMENTATION MAMMAPLASTY Bilateral 2005   BARTHOLIN CYST MARSUPIALIZATION Left 05/08/2020   Procedure: BARTHOLIN CYST MARSUPIALIZATION;   Surgeon: [name], MD;  Location: [facility];  Service: Gynecology;  Laterality: Left;   CESAREAN SECTION     COLONOSCOPY  2018   DILATION AND EVACUATION  09/05/2011   Procedure: DILATATION AND EVACUATION;  Surgeon: Elveria Royals, MD;  Location: [facility];  Service: Gynecology;  Laterality: Bilateral;   ROBOTIC ASSISTED SUPRACERVICAL HYSTERECTOMY WITH BILATERAL SALPINGO OOPHERECTOMY Bilateral 05/08/2020   Procedure: XI ROBOTIC ASSISTED SUPRACERVICAL HYSTERECTOMY WITH BILATERAL SALPINGECTOMY;  Surgeon: [name], MD;  Location: [facility];  Service: Gynecology;  Laterality: Bilateral;   TUBAL LIGATION     WISDOM TOOTH EXTRACTION      Social History   Tobacco Use   Smoking status: Never   Smokeless tobacco: Never  Vaping Use   Vaping Use: Never used  Substance Use Topics   Alcohol use: Yes    Comment: occasionally   Drug use: No     Medication list has been reviewed and updated.  Current Meds  Medication Sig   albuterol (VENTOLIN HFA) 108 (90 Base) MCG/ACT inhaler Inhale 2 puffs into the lungs every 6 (six) hours as needed for wheezing or shortness of breath.   amLODipine (NORVASC) 2.5 MG tablet Take 1 tablet (2.5 mg total) by mouth daily.   Ascorbic Acid (VITAMIN C GUMMIE PO) Take 1 tablet by mouth daily.   Biotin w/ Vitamins C & E (HAIR/SKIN/NAILS PO) Take 2 tablets by mouth daily.   cetirizine (ZYRTEC) 10 MG tablet Take 1 tablet (10 mg total) by mouth daily. (Patient taking differently: Take 10 mg by mouth at bedtime.)   cholecalciferol (VITAMIN D3) 25 MCG (1000 UNIT) tablet Take 1,000 Units by mouth daily.   hydrochlorothiazide (HYDRODIURIL) 25 MG tablet Take 1 tablet (25 mg total) by mouth daily.   metoprolol succinate (TOPROL-XL) 100 MG 24 hr tablet TAKE 1 TABLET BY MOUTH DAILY. TAKE WITH OR IMMEDIATELY FOLLOWING A MEAL.   montelukast (SINGULAIR) 10 MG tablet TAKE 1 TABLET BY MOUTH EVERYDAY AT BEDTIME   Multiple  Vitamins-Minerals (MULTIVITAMIN GUMMIES WOMENS PO) Take 2 tablets by mouth daily.   mupirocin nasal ointment (BACTROBAN) 2 % Place 1 application into the nose 2 (two) times daily. Use one-half of tube in each nostril twice daily for five (5) days. After application, press sides of nose together and gently massage.   Omega-3 Fatty Acids (FISH OIL PO) Take 1 capsule by mouth daily.   triamcinolone (NASACORT) 55 MCG/ACT AERO nasal inhaler Place 2 sprays into the nose daily.   triamcinolone cream (KENALOG) 0.1 % Apply 1 Application topically 2 (two) times daily.   valACYclovir (VALTREX) 1000 MG tablet Take 1 tablet (1,000 mg total) by mouth as needed (for flare ups).   [DISCONTINUED] doxycycline (VIBRA-TABS) 100 MG tablet Take 1 tablet (100 mg total) by mouth 2 (two) times daily.       06/26/2022    8:43 AM 05/28/2022    8:49 AM 02/28/2022    9:14 AM 02/28/2022    9:08 AM  GAD 7 : Generalized Anxiety Score  Nervous, Anxious, on Edge 0 0 0 0  Control/stop worrying 0 0 0 0  Worry too much - different things 0 0 0 0  Trouble relaxing 0 0 0 0  Restless 0 0 0 0  Easily annoyed or irritable 0 0 0 0  Afraid - awful might happen 0 0 0 0  Total GAD 7 Score 0 0 0 0  Anxiety Difficulty Not difficult at all Not difficult at all Not difficult at all Not difficult at all       05/28/2022    8:49 AM 02/28/2022    9:14 AM 02/28/2022    9:08 AM  Depression screen PHQ 2/9  Decreased Interest 0 0 0  Down, Depressed, Hopeless 0 0 0  PHQ - 2 Score 0 0 0  Altered sleeping 0 0 0  Tired, decreased energy 0 0 0  Change in appetite 0 0 0  Feeling bad or failure about yourself  0 0 0  Trouble concentrating 0 0 0  Moving slowly or fidgety/restless 0 0 0  Suicidal thoughts 0 0 0  PHQ-9 Score 0 0 0  Difficult doing work/chores Not difficult at all Not difficult at all Not difficult at all    BP Readings from Last 3 Encounters:  06/26/22 (!) 140/98  05/28/22 120/78  05/06/22 (!) 169/82    Physical Exam Vitals  and nursing note reviewed. Exam conducted with a chaperone present.  Constitutional:      General: She is not in acute distress.    Appearance: She is not diaphoretic.  HENT:     Head: Normocephalic and atraumatic.     Right Ear: Tympanic membrane and external ear normal.     Left Ear: Tympanic membrane and external ear normal.     Nose: Nose normal.     Mouth/Throat:     Mouth: Mucous membranes are moist.     Pharynx: No oropharyngeal exudate or posterior oropharyngeal erythema.  Eyes:     General:        Right eye: No discharge.        Left eye: No discharge.     Conjunctiva/sclera: Conjunctivae normal.     Pupils: Pupils are equal, round, and reactive to light.  Neck:     Thyroid: No thyromegaly.     Vascular: No JVD.  Cardiovascular:     Rate and Rhythm: Normal rate and regular rhythm.     Heart sounds: Normal heart sounds. No murmur heard.    No friction rub. No gallop.  Pulmonary:     Effort: Pulmonary effort is normal.     Breath sounds: Normal breath sounds. No wheezing, rhonchi or rales.  Abdominal:     General: Bowel sounds are normal.     Palpations: Abdomen is soft. There is no mass.     Tenderness: There is no abdominal tenderness. There is no guarding.  Musculoskeletal:        General: No tenderness. Normal range of motion.     Cervical back: Normal range of motion and neck supple.  Lymphadenopathy:     Cervical: No cervical adenopathy.  Skin:    General: Skin is warm and dry.  Neurological:     Mental Status: She is alert.     Deep Tendon Reflexes: Reflexes are normal and symmetric.     Wt Readings from Last 3 Encounters:  06/26/22 224 lb (101.6 kg)  05/28/22 224 lb (101.6 kg)  05/06/22 200 lb (90.7 kg)    BP (!) 140/98   Pulse 72   Ht 5' 3.5" (1.613 m)   Wt 224 lb (101.6 kg)   LMP 04/27/2020 Comment: Supracervical  SpO2 98%   BMI 39.06 kg/m   Assessment and Plan:  1. Mild intermittent reactive airway disease without complication Chronic.   Controlled.  Stable.  Continue albuterol inhaler 2 puffs every 6 hours as needed wheezing shortness of breath. - albuterol (VENTOLIN HFA) 108 (90 Base) MCG/ACT inhaler; Inhale 2 puffs into the lungs every 6 (six) hours as needed for wheezing or shortness of breath.  Dispense: 8 g; Refill: 2  2. Essential hypertension Chronic.  Controlled.  Stable.  Blood pressure 140/98.  Asymptomatic.  Tolerating current medications including amlodipine 2.5 mg well continue hydrochlorothiazide 25 mg once a day.  Continue metoprolol XL 100 mg once a day.  We will increase amlodipine to 5 mg daily.  We will recheck patient in 6 weeks blood pressure. - hydrochlorothiazide (HYDRODIURIL) 25 MG tablet; Take 1 tablet (25 mg total) by mouth daily.  Dispense: 90 tablet; Refill: 1 - metoprolol succinate (TOPROL-XL) 100 MG 24 hr tablet; TAKE 1 TABLET BY MOUTH DAILY. TAKE WITH OR IMMEDIATELY FOLLOWING A MEAL.  Dispense: 90 tablet; Refill: 1 - Renal Function Panel  3. Seasonal allergic rhinitis due to pollen Chronic.  Controlled.  Stable.  Continue Singulair 10 mg daily. - montelukast (SINGULAIR) 10 MG tablet; TAKE 1 TABLET BY MOUTH EVERYDAY AT BEDTIME  Dispense: 90 tablet; Refill: 1  4. Post-COVID chronic cough As noted above - montelukast (SINGULAIR) 10 MG tablet; TAKE 1 TABLET BY MOUTH EVERYDAY AT BEDTIME  Dispense: 90 tablet; Refill: 1  5. HSV-1 (herpes simplex virus 1) infection Chronic.  Controlled.  Stable.  On as-needed basis patient has been provided valacyclovir 1 g 1 to 2 tablets twice a day as needed for recurrence. - valACYclovir (VALTREX) 1000 MG tablet; Take 1 tablet (1,000 mg total) by mouth as needed (for flare ups).  Dispense: 10 tablet; Refill: 5    Otilio Miu, MD

## 2022-06-27 LAB — RENAL FUNCTION PANEL
Albumin: 4 g/dL (ref 3.8–4.9)
BUN/Creatinine Ratio: 22 (ref 9–23)
BUN: 22 mg/dL (ref 6–24)
CO2: 27 mmol/L (ref 20–29)
Calcium: 9.7 mg/dL (ref 8.7–10.2)
Chloride: 96 mmol/L (ref 96–106)
Creatinine, Ser: 0.98 mg/dL (ref 0.57–1.00)
Glucose: 110 mg/dL — ABNORMAL HIGH (ref 70–99)
Phosphorus: 3.2 mg/dL (ref 3.0–4.3)
Potassium: 3.8 mmol/L (ref 3.5–5.2)
Sodium: 138 mmol/L (ref 134–144)
eGFR: 70 mL/min/{1.73_m2} (ref >59)

## 2022-07-22 ENCOUNTER — Other Ambulatory Visit (HOSPITAL_COMMUNITY)
Admission: RE | Admit: 2022-07-22 | Discharge: 2022-07-22 | Disposition: A | Discharge status: Home / Self Care | Payer: BC Managed Care – PPO | Source: Ambulatory Visit | Attending: Obstetrics & Gynecology | Admitting: Obstetrics & Gynecology

## 2022-07-22 ENCOUNTER — Ambulatory Visit (INDEPENDENT_AMBULATORY_CARE_PROVIDER_SITE_OTHER): Payer: BC Managed Care – PPO | Admitting: Obstetrics & Gynecology

## 2022-07-22 ENCOUNTER — Encounter: Payer: Self-pay | Admitting: Obstetrics & Gynecology

## 2022-07-22 VITALS — BP 122/78 | HR 75 | Ht 63.0 in | Wt 219.0 lb

## 2022-07-22 DIAGNOSIS — Z90711 Acquired absence of uterus with remaining cervical stump: Secondary | ICD-10-CM

## 2022-07-22 DIAGNOSIS — Z01419 Encounter for gynecological examination (general) (routine) without abnormal findings: Secondary | ICD-10-CM | POA: Diagnosis not present

## 2022-07-22 DIAGNOSIS — R350 Frequency of micturition: Secondary | ICD-10-CM | POA: Diagnosis not present

## 2022-07-22 LAB — URINALYSIS, COMPLETE W/RFL CULTURE
Bacteria, UA: NONE SEEN /HPF
Bilirubin Urine: NEGATIVE
Glucose, UA: NEGATIVE
Ketones, ur: NEGATIVE
Leukocyte Esterase: NEGATIVE
Nitrites, Initial: NEGATIVE
RBC / HPF: NONE SEEN /HPF (ref 0–2)
Specific Gravity, Urine: 1.025 (ref 1.001–1.035)
WBC, UA: NONE SEEN /HPF (ref 0–5)
pH: 6.5 (ref 5.0–8.0)

## 2022-07-22 LAB — NO CULTURE INDICATED

## 2022-07-22 NOTE — Progress Notes (Addendum)
Jaclyn Fields 05-23-1971 [id number]   History:    52 y.o.  G2P1A1L1 Married.  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   RP:  Established patient presenting for annual gyn exam   HPI:  S/P XI Robotic Supracervical Hysterectomy with Bilateral Salpingectomy with extensive lysis of adhesions and Lt Bartholin Gland Marsupialization on 05/08/2020.  Patho benign.  Pap Neg 04/2020.  Pap reflex today. Urinary frequency.  Will do U/A.  BMs normal.  Breasts normal.  Screening mammo per patient, done 04/26/22, [facility], report not available.  BMI 38.79. Managing with the [facility]. Health labs with Fam MD. Jaclyn Fields 02/2017.  Flu vaccine received already this year.  Past medical history,surgical history, family history and social history were all reviewed and documented in the EPIC chart.  Gynecologic History Patient's last menstrual period was 04/27/2020.  Obstetric History OB History  Gravida Para Term Preterm AB Living  '1 1 1   '$ 0 1  SAB IAB Ectopic Multiple Live Births    0     1    # Outcome Date GA Lbr Len/2nd Weight Sex Delivery Anes PTL Lv  1 Term 08/23/11 [redacted]w[redacted]d 6 lb 10.9 oz (3.03 kg) F CS-LTranv Spinal  LIV     ROS: A ROS was performed and pertinent positives and negatives are included in the history. GENERAL: No fevers or chills. HEENT: No change in vision, no earache, sore throat or sinus congestion. NECK: No pain or stiffness. CARDIOVASCULAR: No chest pain or pressure. No palpitations. PULMONARY: No shortness of breath, cough or wheeze. GASTROINTESTINAL: No abdominal pain, nausea, vomiting or diarrhea, melena or bright red blood per rectum. GENITOURINARY: No urinary frequency, urgency, hesitancy or dysuria. MUSCULOSKELETAL: No joint or muscle pain, no back pain, no recent trauma. DERMATOLOGIC: No rash, no itching, no lesions. ENDOCRINE: No polyuria, polydipsia, no heat or cold intolerance. No recent change in weight. HEMATOLOGICAL: No anemia or easy  bruising or bleeding. NEUROLOGIC: No headache, seizures, numbness, tingling or weakness. PSYCHIATRIC: No depression, no loss of interest in normal activity or change in sleep pattern.     Exam:   BP 122/78 (BP Location: Right Arm, Patient Position: Sitting, Cuff Size: Large)   Pulse 75   Ht '5\' 3"'$  (1.6 m)   Wt 219 lb (99.3 kg)   LMP 04/27/2020 Comment: Supracervical   SpO2 97%   BMI 38.79 kg/m   Body mass index is 38.79 kg/m.  General appearance : Well developed well nourished female. No acute distress HEENT: Eyes: no retinal hemorrhage or exudates,  Neck supple, trachea midline, no carotid bruits, no thyroidmegaly Lungs: Clear to auscultation, no rhonchi or wheezes, or rib retractions  Heart: Regular rate and rhythm, no murmurs or gallops Breast:Examined in sitting and supine position were symmetrical in appearance, no palpable masses or tenderness,  no skin retraction, no nipple inversion, no nipple discharge, no skin discoloration, no axillary or supraclavicular lymphadenopathy Abdomen: no palpable masses or tenderness, no rebound or guarding Extremities: no edema or skin discoloration or tenderness  Pelvic: Vulva: Normal             Vagina: No gross lesions or discharge  Cervix: No gross lesions or discharge.  Pap reflex done.  Uterus  Absent  Adnexa  Without masses or tenderness  Anus: Normal  U/A Yellow clear Nit Neg, Pro 3+, WBC Neg, RBC Neg.  Bacteria Neg. Hyaline casts 1-3.     Assessment/Plan:  52y.o. female for annual  exam   1. Encounter for routine gynecological examination with Papanicolaou smear of cervix S/P XI Robotic Supracervical Hysterectomy with Bilateral Salpingectomy with extensive lysis of adhesions and Lt Bartholin Gland Marsupialization on 05/08/2020.  Patho benign.  Pap Neg 04/2020.  Pap reflex today. Urinary frequency.  Will do U/A.  BMs normal.  Breasts normal.  Screening mammo per patient, done 04/26/22, [facility], report not  available.  BMI 38.79. Managing with the [facility]. Health labs with Fam MD. Jaclyn Fields 02/2017.  Flu vaccine received already this year. - Cytology - PAP( [facility])  2. S/P laparoscopic supracervical hysterectomy  3. Urinary frequency U/A with no evidence of cystitis.  Hyaline casts 1-3.  Recommend repeating U/A with Fam MD. Jaclyn Fields w/RFL Culture   Jaclyn Bruins MD, 8:45 AM

## 2022-07-23 ENCOUNTER — Telehealth: Payer: Self-pay

## 2022-07-23 NOTE — Telephone Encounter (Signed)
Pt notified and voiced understanding 

## 2022-07-23 NOTE — Telephone Encounter (Signed)
-----  Message from [name], MD sent at 07/22/2022  9:18 AM EST ----- Regarding: Hyaline casts in U/A U/A with no evidence of cystitis.  Hyaline casts 1-3.  Recommend repeating U/A with Fam MD.

## 2022-07-25 LAB — CYTOLOGY - PAP
Comment: NEGATIVE
Diagnosis: NEGATIVE
High risk HPV: NEGATIVE

## 2022-08-08 ENCOUNTER — Ambulatory Visit (INDEPENDENT_AMBULATORY_CARE_PROVIDER_SITE_OTHER): Payer: BC Managed Care – PPO | Admitting: Family Medicine

## 2022-08-08 ENCOUNTER — Encounter: Payer: Self-pay | Admitting: Family Medicine

## 2022-08-08 VITALS — BP 144/94 | HR 71 | Ht 63.0 in | Wt 221.0 lb

## 2022-08-08 DIAGNOSIS — I10 Essential (primary) hypertension: Secondary | ICD-10-CM

## 2022-08-08 DIAGNOSIS — M779 Enthesopathy, unspecified: Secondary | ICD-10-CM

## 2022-08-08 MED ORDER — METOPROLOL SUCCINATE ER 100 MG PO TB24: ORAL_TABLET | ORAL | 1 refills | Status: DC

## 2022-08-08 MED ORDER — HYDROCHLOROTHIAZIDE 25 MG PO TABS: 25.0000 mg | ORAL_TABLET | Freq: Every day | ORAL | 1 refills | Status: DC

## 2022-08-08 MED ORDER — MELOXICAM 15 MG PO TABS: 15.0000 mg | ORAL_TABLET | Freq: Every day | ORAL | 0 refills | Status: AC

## 2022-08-08 MED ORDER — AMLODIPINE BESYLATE 10 MG PO TABS: 10.0000 mg | ORAL_TABLET | Freq: Every day | ORAL | 1 refills | Status: DC

## 2022-08-08 NOTE — Patient Instructions (Signed)

## 2022-08-08 NOTE — Progress Notes (Signed)
Date:  08/08/2022   Name:  Jaclyn Fields   DOB:  1970/11/21   MRN:  [id number]   Chief Complaint: Hypertension and Toe Pain (X 5 days, Left big toe, no injuries, not painful to touch, hurts when putting pressure on it, no swelling)  Hypertension This is a chronic problem. The current episode started more than 1 year ago. The problem has been gradually improving since onset. The problem is uncontrolled. Pertinent negatives include no chest pain, headaches, orthopnea, palpitations or shortness of breath. There are no associated agents to hypertension. There are no known risk factors for coronary artery disease. Past treatments include beta blockers, calcium channel blockers and diuretics. The current treatment provides moderate improvement. There are no compliance problems.  There is no history of angina, kidney disease, CAD/MI, CVA, heart failure, left ventricular hypertrophy, PVD or retinopathy. There is no history of chronic renal disease or a hypertension causing med.  Toe Pain  There was no injury mechanism. The pain is present in the left toes (left great toe). The pain is moderate. The pain has been Fluctuating since onset. Associated symptoms include a loss of motion. Pertinent negatives include no inability to bear weight, loss of sensation, numbness or tingling.    Lab Results  Component Value Date   NA 138 06/26/2022   K 3.8 06/26/2022   CO2 27 06/26/2022   GLUCOSE 110 (H) 06/26/2022   BUN 22 06/26/2022   CREATININE 0.98 06/26/2022   CALCIUM 9.7 06/26/2022   EGFR 70 06/26/2022   GFRNONAA >60 04/27/2020   Lab Results  Component Value Date   CHOL 198 02/07/2022   HDL 54 02/07/2022   LDLCALC 127 (H) 02/07/2022   TRIG 96 02/07/2022   CHOLHDL 3.2 04/12/2016   Lab Results  Component Value Date   TSH 1.73 03/03/2020   No results found for: "HGBA1C" Lab Results  Component Value Date   WBC 11.9 (H) 07/24/2020   HGB 11.6 (L) 07/24/2020   HCT 36.5 07/24/2020   MCV 85.9  07/24/2020   PLT 494 (H) 07/24/2020   Lab Results  Component Value Date   ALT 9 02/07/2022   AST 10 02/07/2022   ALKPHOS 111 02/07/2022   BILITOT 0.3 02/07/2022   Lab Results  Component Value Date   VD25OH 20 (L) 01/12/2019     Review of Systems  Constitutional: Negative.  Negative for chills, fatigue, fever and unexpected weight change.  HENT:  Negative for congestion, ear discharge, ear pain, rhinorrhea, sinus pressure, sneezing and sore throat.   Respiratory:  Negative for cough, shortness of breath, wheezing and stridor.   Cardiovascular:  Negative for chest pain, palpitations and orthopnea.  Gastrointestinal:  Negative for abdominal pain, blood in stool, constipation, diarrhea and nausea.  Genitourinary:  Negative for dysuria, flank pain, frequency, hematuria, urgency and vaginal discharge.  Musculoskeletal:  Negative for arthralgias, back pain and myalgias.  Skin:  Negative for rash.  Neurological:  Negative for dizziness, tingling, weakness, numbness and headaches.  Hematological:  Negative for adenopathy. Does not bruise/bleed easily.  Psychiatric/Behavioral:  Negative for dysphoric mood. The patient is not nervous/anxious.     Patient Active Problem List   Diagnosis Date Noted   Postoperative state 05/08/2020   Post-operative state 05/08/2020   Essential hypertension 01/02/2018   Transfusion history 08/12/2012   Postpartum hemorrhage 09/05/2011   PP care - s/p 1C/S 3/1 (breech, [facility]) 08/24/2011   Maternal iron deficiency anemia 08/24/2011   Benign essential hypertension complicating pregnancy,  childbirth, and the puerperium 08/23/2011    Allergies  Allergen Reactions   Sulfa Antibiotics Swelling and Rash    SWELLING TO FINGERS    Past Surgical History:  Procedure Laterality Date   AUGMENTATION MAMMAPLASTY Bilateral 2005   BARTHOLIN CYST MARSUPIALIZATION Left 05/08/2020   Procedure: BARTHOLIN CYST MARSUPIALIZATION;  Surgeon: [name], MD;   Location: [place];  Service: Gynecology;  Laterality: Left;   CESAREAN SECTION     COLONOSCOPY  2018   DILATION AND EVACUATION  09/05/2011   Procedure: DILATATION AND EVACUATION;  Surgeon: Elveria Royals, MD;  Location: [facility];  Service: Gynecology;  Laterality: Bilateral;   ROBOTIC ASSISTED SUPRACERVICAL HYSTERECTOMY WITH BILATERAL SALPINGO OOPHERECTOMY Bilateral 05/08/2020   Procedure: XI ROBOTIC ASSISTED SUPRACERVICAL HYSTERECTOMY WITH BILATERAL SALPINGECTOMY;  Surgeon: [name], MD;  Location: [place];  Service: Gynecology;  Laterality: Bilateral;   TUBAL LIGATION     WISDOM TOOTH EXTRACTION      Social History   Tobacco Use   Smoking status: Never   Smokeless tobacco: Never  Vaping Use   Vaping Use: Never used  Substance Use Topics   Alcohol use: Yes    Comment: occasionally   Drug use: No     Medication list has been reviewed and updated.  Current Meds  Medication Sig   albuterol (VENTOLIN HFA) 108 (90 Base) MCG/ACT inhaler Inhale 2 puffs into the lungs every 6 (six) hours as needed for wheezing or shortness of breath.   amLODipine (NORVASC) 5 MG tablet Take 1 tablet (5 mg total) by mouth daily.   Ascorbic Acid (VITAMIN C GUMMIE PO) Take 1 tablet by mouth daily.   Biotin w/ Vitamins C & E (HAIR/SKIN/NAILS PO) Take 2 tablets by mouth daily.   cetirizine (ZYRTEC) 10 MG tablet Take 1 tablet (10 mg total) by mouth daily. (Patient taking differently: Take 10 mg by mouth at bedtime.)   cholecalciferol (VITAMIN D3) 25 MCG (1000 UNIT) tablet Take 1,000 Units by mouth daily.   hydrochlorothiazide (HYDRODIURIL) 25 MG tablet Take 1 tablet (25 mg total) by mouth daily.   metoprolol succinate (TOPROL-XL) 100 MG 24 hr tablet TAKE 1 TABLET BY MOUTH DAILY. TAKE WITH OR IMMEDIATELY FOLLOWING A MEAL.   montelukast (SINGULAIR) 10 MG tablet TAKE 1 TABLET BY MOUTH EVERYDAY AT BEDTIME   Multiple Vitamins-Minerals (MULTIVITAMIN GUMMIES WOMENS PO)  Take 2 tablets by mouth daily.   mupirocin nasal ointment (BACTROBAN) 2 % Place 1 application into the nose 2 (two) times daily. Use one-half of tube in each nostril twice daily for five (5) days. After application, press sides of nose together and gently massage.   Omega-3 Fatty Acids (FISH OIL PO) Take 1 capsule by mouth daily.   triamcinolone (NASACORT) 55 MCG/ACT AERO nasal inhaler Place 2 sprays into the nose daily.   triamcinolone cream (KENALOG) 0.1 % Apply 1 Application topically 2 (two) times daily.   valACYclovir (VALTREX) 1000 MG tablet Take 1 tablet (1,000 mg total) by mouth as needed (for flare ups).       08/08/2022    8:35 AM 06/26/2022    8:43 AM 05/28/2022    8:49 AM 02/28/2022    9:14 AM  GAD 7 : Generalized Anxiety Score  Nervous, Anxious, on Edge 0 0 0 0  Control/stop worrying 0 0 0 0  Worry too much - different things 0 0 0 0  Trouble relaxing 0 0 0 0  Restless 0 0 0 0  Easily annoyed  or irritable 0 0 0 0  Afraid - awful might happen 0 0 0 0  Total GAD 7 Score 0 0 0 0  Anxiety Difficulty Not difficult at all Not difficult at all Not difficult at all Not difficult at all       08/08/2022    8:35 AM 05/28/2022    8:49 AM 02/28/2022    9:14 AM  Depression screen PHQ 2/9  Decreased Interest 0 0 0  Down, Depressed, Hopeless 0 0 0  PHQ - 2 Score 0 0 0  Altered sleeping 0 0 0  Tired, decreased energy 0 0 0  Change in appetite 0 0 0  Feeling bad or failure about yourself  0 0 0  Trouble concentrating 0 0 0  Moving slowly or fidgety/restless 0 0 0  Suicidal thoughts 0 0 0  PHQ-9 Score 0 0 0  Difficult doing work/chores Not difficult at all Not difficult at all Not difficult at all    BP Readings from Last 3 Encounters:  08/08/22 (!) 146/90  07/22/22 122/78  06/26/22 (!) 140/98    Physical Exam Vitals and nursing note reviewed.  Constitutional:      Appearance: She is well-developed.  HENT:     Head: Normocephalic.     Right Ear: External ear normal.      Left Ear: External ear normal.  Eyes:     General: Lids are everted, no foreign bodies appreciated. No scleral icterus.       Left eye: No foreign body or hordeolum.     Conjunctiva/sclera: Conjunctivae normal.     Right eye: Right conjunctiva is not injected.     Left eye: Left conjunctiva is not injected.     Pupils: Pupils are equal, round, and reactive to light.  Neck:     Thyroid: No thyromegaly.     Vascular: No JVD.     Trachea: No tracheal deviation.  Cardiovascular:     Rate and Rhythm: Normal rate and regular rhythm.     Heart sounds: Normal heart sounds. No murmur heard.    No friction rub. No gallop.  Pulmonary:     Effort: Pulmonary effort is normal. No respiratory distress.     Breath sounds: Normal breath sounds. No wheezing or rales.  Abdominal:     General: Bowel sounds are normal.     Palpations: Abdomen is soft. There is no mass.     Tenderness: There is no abdominal tenderness. There is no guarding or rebound.  Musculoskeletal:     Cervical back: Normal range of motion and neck supple.     Left foot: Decreased range of motion. Tenderness present.     Comments: Tender flex tendon  Lymphadenopathy:     Cervical: No cervical adenopathy.  Skin:    General: Skin is warm.     Findings: No rash.  Neurological:     Mental Status: She is alert and oriented to person, place, and time.     Cranial Nerves: No cranial nerve deficit.     Deep Tendon Reflexes: Reflexes normal.  Psychiatric:        Mood and Affect: Mood is not anxious or depressed.     Wt Readings from Last 3 Encounters:  08/08/22 221 lb (100.2 kg)  07/22/22 219 lb (99.3 kg)  06/26/22 224 lb (101.6 kg)    BP (!) 146/90   Pulse 71   Ht 5' 3"$  (1.6 m)   Wt 221 lb (100.2 kg)  LMP 04/27/2020 Comment: Supracervical   SpO2 95%   BMI 39.15 kg/m   Assessment and Plan:   1. Essential hypertension Chronic.  Uncontrolled.  Stable.  Blood pressure today 144/94.  However patient has been taking  ibuprofen for toe pain.  However we will continue current dosing of hydrochlorothiazide metoprolol in addition we will increase amlodipine to 10 mg once a day.  Will recheck patient in 6 weeks.  In the meantime patient has been reemphasized DASH diet for weight loss and blood pressure control. - hydrochlorothiazide (HYDRODIURIL) 25 MG tablet; Take 1 tablet (25 mg total) by mouth daily.  Dispense: 90 tablet; Refill: 1 - metoprolol succinate (TOPROL-XL) 100 MG 24 hr tablet; TAKE 1 TABLET BY MOUTH DAILY. TAKE WITH OR IMMEDIATELY FOLLOWING A MEAL.  Dispense: 90 tablet; Refill: 1 - amLODipine (NORVASC) 10 MG tablet; Take 1 tablet (10 mg total) by mouth daily.  Dispense: 90 tablet; Refill: 1  2. Tendonitis Acute.  Persistent.  Stable.  Probably related to a certain pair shoes.  Patient has been taking ibuprofen and we will switch over to meloxicam 15 mg once a day.  And encouraged Tylenol on a as needed basis.  Exam is more consistent with tendinitis and gout there is no tenderness from the medial palpation of the toe but more so on the bottom and is exacerbated by flexion of great toe on the left. - meloxicam (MOBIC) 15 MG tablet; Take 1 tablet (15 mg total) by mouth daily.  Dispense: 30 tablet; Refill: 0    Otilio Miu, MD

## 2022-08-09 DIAGNOSIS — R0602 Shortness of breath: Secondary | ICD-10-CM | POA: Diagnosis not present

## 2022-08-09 DIAGNOSIS — R5383 Other fatigue: Secondary | ICD-10-CM | POA: Diagnosis not present

## 2022-08-09 DIAGNOSIS — Z131 Encounter for screening for diabetes mellitus: Secondary | ICD-10-CM | POA: Diagnosis not present

## 2022-08-09 DIAGNOSIS — R7303 Prediabetes: Secondary | ICD-10-CM | POA: Diagnosis not present

## 2022-08-09 DIAGNOSIS — R635 Abnormal weight gain: Secondary | ICD-10-CM | POA: Diagnosis not present

## 2022-08-13 ENCOUNTER — Ambulatory Visit: Payer: BC Managed Care – PPO | Admitting: Family Medicine

## 2022-08-29 DIAGNOSIS — B354 Tinea corporis: Secondary | ICD-10-CM | POA: Diagnosis not present

## 2022-08-29 DIAGNOSIS — Z6838 Body mass index (BMI) 38.0-38.9, adult: Secondary | ICD-10-CM | POA: Diagnosis not present

## 2022-08-29 DIAGNOSIS — R7303 Prediabetes: Secondary | ICD-10-CM | POA: Diagnosis not present

## 2022-08-29 DIAGNOSIS — R635 Abnormal weight gain: Secondary | ICD-10-CM | POA: Diagnosis not present

## 2022-09-16 DIAGNOSIS — L03115 Cellulitis of right lower limb: Secondary | ICD-10-CM | POA: Diagnosis not present

## 2022-09-16 DIAGNOSIS — L259 Unspecified contact dermatitis, unspecified cause: Secondary | ICD-10-CM | POA: Diagnosis not present

## 2022-09-16 DIAGNOSIS — L03116 Cellulitis of left lower limb: Secondary | ICD-10-CM | POA: Diagnosis not present

## 2022-09-16 DIAGNOSIS — L309 Dermatitis, unspecified: Secondary | ICD-10-CM | POA: Diagnosis not present

## 2022-09-19 ENCOUNTER — Ambulatory Visit: Payer: BC Managed Care – PPO | Admitting: Family Medicine

## 2022-09-23 ENCOUNTER — Ambulatory Visit (INDEPENDENT_AMBULATORY_CARE_PROVIDER_SITE_OTHER): Payer: BC Managed Care – PPO | Admitting: Family Medicine

## 2022-09-23 ENCOUNTER — Other Ambulatory Visit: Payer: Self-pay | Admitting: Family Medicine

## 2022-09-23 ENCOUNTER — Encounter: Payer: Self-pay | Admitting: Family Medicine

## 2022-09-23 VITALS — BP 120/68 | HR 76 | Ht 63.0 in | Wt 220.0 lb

## 2022-09-23 DIAGNOSIS — I10 Essential (primary) hypertension: Secondary | ICD-10-CM | POA: Diagnosis not present

## 2022-09-23 DIAGNOSIS — R739 Hyperglycemia, unspecified: Secondary | ICD-10-CM

## 2022-09-23 DIAGNOSIS — R809 Proteinuria, unspecified: Secondary | ICD-10-CM | POA: Diagnosis not present

## 2022-09-23 MED ORDER — AMLODIPINE BESYLATE 5 MG PO TABS: 5.0000 mg | ORAL_TABLET | Freq: Every day | ORAL | 0 refills | Status: DC

## 2022-09-23 NOTE — Progress Notes (Signed)
Date:  09/23/2022   Name:  Jaclyn Fields   DOB:  1970-09-26   MRN:  [id number]   Chief Complaint: Hypertension  Hypertension This is a chronic problem. The current episode started more than 1 year ago. The problem has been gradually improving since onset. The problem is controlled. Pertinent negatives include no blurred vision, chest pain, headaches, malaise/fatigue, neck pain, orthopnea, palpitations, peripheral edema, PND, shortness of breath or sweats. There are no associated agents to hypertension. Risk factors for coronary artery disease include dyslipidemia. Past treatments include calcium channel blockers and diuretics. The current treatment provides moderate improvement. There are no compliance problems.     Lab Results  Component Value Date   NA 138 06/26/2022   K 3.8 06/26/2022   CO2 27 06/26/2022   GLUCOSE 110 (H) 06/26/2022   BUN 22 06/26/2022   CREATININE 0.98 06/26/2022   CALCIUM 9.7 06/26/2022   EGFR 70 06/26/2022   GFRNONAA >60 04/27/2020   Lab Results  Component Value Date   CHOL 198 02/07/2022   HDL 54 02/07/2022   LDLCALC 127 (H) 02/07/2022   TRIG 96 02/07/2022   CHOLHDL 3.2 04/12/2016   Lab Results  Component Value Date   TSH 1.73 03/03/2020   No results found for: "HGBA1C" Lab Results  Component Value Date   WBC 11.9 (H) 07/24/2020   HGB 11.6 (L) 07/24/2020   HCT 36.5 07/24/2020   MCV 85.9 07/24/2020   PLT 494 (H) 07/24/2020   Lab Results  Component Value Date   ALT 9 02/07/2022   AST 10 02/07/2022   ALKPHOS 111 02/07/2022   BILITOT 0.3 02/07/2022   Lab Results  Component Value Date   VD25OH 20 (L) 01/12/2019     Review of Systems  Constitutional:  Negative for malaise/fatigue.  Eyes:  Negative for blurred vision, photophobia and visual disturbance.  Respiratory:  Negative for cough, choking, shortness of breath and wheezing.   Cardiovascular:  Positive for leg swelling. Negative for chest pain, palpitations, orthopnea and PND.   Genitourinary:  Urgency: a1c.  Musculoskeletal:  Negative for neck pain.  Neurological:  Negative for headaches.    Patient Active Problem List   Diagnosis Date Noted   Postoperative state 05/08/2020   Post-operative state 05/08/2020   Essential hypertension 01/02/2018   Transfusion history 08/12/2012   Postpartum hemorrhage 09/05/2011   PP care - s/p 1C/S 3/1 (breech, [facility]) 08/24/2011   Maternal iron deficiency anemia 08/24/2011   Benign essential hypertension complicating pregnancy, childbirth, and the puerperium 08/23/2011    Allergies  Allergen Reactions   Sulfa Antibiotics Swelling and Rash    SWELLING TO FINGERS    Past Surgical History:  Procedure Laterality Date   AUGMENTATION MAMMAPLASTY Bilateral 2005   BARTHOLIN CYST MARSUPIALIZATION Left 05/08/2020   Procedure: BARTHOLIN CYST MARSUPIALIZATION;  Surgeon: [name], MD;  Location: [place];  Service: Gynecology;  Laterality: Left;   CESAREAN SECTION     COLONOSCOPY  2018   DILATION AND EVACUATION  09/05/2011   Procedure: DILATATION AND EVACUATION;  Surgeon: Elveria Royals, MD;  Location: [facility];  Service: Gynecology;  Laterality: Bilateral;   ROBOTIC ASSISTED SUPRACERVICAL HYSTERECTOMY WITH BILATERAL SALPINGO OOPHERECTOMY Bilateral 05/08/2020   Procedure: XI ROBOTIC ASSISTED SUPRACERVICAL HYSTERECTOMY WITH BILATERAL SALPINGECTOMY;  Surgeon: [name], MD;  Location: [place];  Service: Gynecology;  Laterality: Bilateral;   TUBAL LIGATION     WISDOM TOOTH EXTRACTION      Social History  Tobacco Use   Smoking status: Never   Smokeless tobacco: Never  Vaping Use   Vaping Use: Never used  Substance Use Topics   Alcohol use: Yes    Comment: occasionally   Drug use: No     Medication list has been reviewed and updated.  Current Meds  Medication Sig   albuterol (VENTOLIN HFA) 108 (90 Base) MCG/ACT inhaler Inhale 2 puffs into the lungs every 6 (six)  hours as needed for wheezing or shortness of breath.   amLODipine (NORVASC) 10 MG tablet Take 1 tablet (10 mg total) by mouth daily.   Ascorbic Acid (VITAMIN C GUMMIE PO) Take 1 tablet by mouth daily.   Biotin w/ Vitamins C & E (HAIR/SKIN/NAILS PO) Take 2 tablets by mouth daily.   cetirizine (ZYRTEC) 10 MG tablet Take 1 tablet (10 mg total) by mouth daily. (Patient taking differently: Take 10 mg by mouth at bedtime.)   cholecalciferol (VITAMIN D3) 25 MCG (1000 UNIT) tablet Take 1,000 Units by mouth daily.   hydrochlorothiazide (HYDRODIURIL) 25 MG tablet Take 1 tablet (25 mg total) by mouth daily.   meloxicam (MOBIC) 15 MG tablet Take 1 tablet (15 mg total) by mouth daily.   metoprolol succinate (TOPROL-XL) 100 MG 24 hr tablet TAKE 1 TABLET BY MOUTH DAILY. TAKE WITH OR IMMEDIATELY FOLLOWING A MEAL.   montelukast (SINGULAIR) 10 MG tablet TAKE 1 TABLET BY MOUTH EVERYDAY AT BEDTIME   Multiple Vitamins-Minerals (MULTIVITAMIN GUMMIES WOMENS PO) Take 2 tablets by mouth daily.   mupirocin nasal ointment (BACTROBAN) 2 % Place 1 application into the nose 2 (two) times daily. Use one-half of tube in each nostril twice daily for five (5) days. After application, press sides of nose together and gently massage.   Omega-3 Fatty Acids (FISH OIL PO) Take 1 capsule by mouth daily.   triamcinolone (NASACORT) 55 MCG/ACT AERO nasal inhaler Place 2 sprays into the nose daily.   triamcinolone cream (KENALOG) 0.1 % Apply 1 Application topically 2 (two) times daily.   valACYclovir (VALTREX) 1000 MG tablet Take 1 tablet (1,000 mg total) by mouth as needed (for flare ups).       09/23/2022    3:41 PM 08/08/2022    8:35 AM 06/26/2022    8:43 AM 05/28/2022    8:49 AM  GAD 7 : Generalized Anxiety Score  Nervous, Anxious, on Edge 0 0 0 0  Control/stop worrying 0 0 0 0  Worry too much - different things 0 0 0 0  Trouble relaxing 0 0 0 0  Restless 0 0 0 0  Easily annoyed or irritable 0 0 0 0  Afraid - awful might happen 0  0 0 0  Total GAD 7 Score 0 0 0 0  Anxiety Difficulty Not difficult at all Not difficult at all Not difficult at all Not difficult at all       09/23/2022    3:41 PM 08/08/2022    8:35 AM 05/28/2022    8:49 AM  Depression screen PHQ 2/9  Decreased Interest 0 0 0  Down, Depressed, Hopeless 0 0 0  PHQ - 2 Score 0 0 0  Altered sleeping 0 0 0  Tired, decreased energy 0 0 0  Change in appetite 0 0 0  Feeling bad or failure about yourself  0 0 0  Trouble concentrating 0 0 0  Moving slowly or fidgety/restless 0 0 0  Suicidal thoughts 0 0 0  PHQ-9 Score 0 0 0  Difficult doing work/chores  Not difficult at all Not difficult at all Not difficult at all    BP Readings from Last 3 Encounters:  09/23/22 120/68  08/08/22 (!) 144/94  07/22/22 122/78    Physical Exam HENT:     Right Ear: Tympanic membrane and ear canal normal.     Left Ear: Tympanic membrane and ear canal normal.     Nose: Nose normal. No congestion or rhinorrhea.     Mouth/Throat:     Mouth: Mucous membranes are moist.  Cardiovascular:     Rate and Rhythm: Normal rate.     Heart sounds: S1 normal and S2 normal. No murmur heard.    No systolic murmur is present.     No diastolic murmur is present.     No friction rub. No gallop. No S3 or S4 sounds.  Pulmonary:     Breath sounds: No wheezing, rhonchi or rales.  Musculoskeletal:     Right lower leg: 2+ Edema present.     Left lower leg: 2+ Edema present.  Neurological:     Mental Status: She is alert.     Wt Readings from Last 3 Encounters:  09/23/22 220 lb (99.8 kg)  08/08/22 221 lb (100.2 kg)  07/22/22 219 lb (99.3 kg)    BP 120/68   Pulse 76   Ht 5\' 3"  (1.6 m)   Wt 220 lb (99.8 kg)   LMP 04/27/2020 Comment: Supracervical   SpO2 97%   BMI 38.97 kg/m   Assessment and Plan:  1. Essential hypertension Chronic.  Controlled.  Stable.  Blood pressure is in excellent range at 120/68 however patient has noticed swelling of both her feet even though that she  is got a rash that is being looked at closer amlodipine may be involved with this and I decreased it to 5 mg again I will recheck in 4 weeks - amLODipine (NORVASC) 5 MG tablet; Take 1 tablet (5 mg total) by mouth daily.  Dispense: 60 tablet; Refill: 0  2. Proteinuria, unspecified type Review of labs diastatic GYN there was 3+ protein in her urine with normal creatinine BUN.  Review of glucose readings have been elevated on some occasions and we will take a look at A1c to see if she may be prediabetic and we will look at protein and creatinine ratio of the urine to see if there is an unusual amount of protein that is escaping.  Will also check her albumin to see if it is low. - Protein / creatinine ratio, urine - Hemoglobin A1c - Albumin  3. Hyperglycemia Chronic.  Noted that this is an the 100-1 20 range on occasion.  Will check A1c to see if patient is prediabetic and if there is a dye component and his proteinuria. - Protein / creatinine ratio, urine - Hemoglobin A1c    Otilio Miu, MD

## 2022-09-23 NOTE — Patient Instructions (Signed)

## 2022-09-24 LAB — PROTEIN / CREATININE RATIO, URINE
Creatinine, Urine: 76.3 mg/dL
Protein, Ur: 82.2 mg/dL
Protein/Creat Ratio: 1077 mg/g creat — ABNORMAL HIGH (ref 0–200)

## 2022-09-24 LAB — ALBUMIN: Albumin: 4.1 g/dL (ref 3.8–4.9)

## 2022-09-25 ENCOUNTER — Ambulatory Visit: Payer: Self-pay | Admitting: *Deleted

## 2022-09-25 ENCOUNTER — Other Ambulatory Visit: Payer: Self-pay

## 2022-09-25 ENCOUNTER — Telehealth: Payer: Self-pay | Admitting: Family Medicine

## 2022-09-25 DIAGNOSIS — R809 Proteinuria, unspecified: Secondary | ICD-10-CM

## 2022-09-25 NOTE — Telephone Encounter (Signed)
Pt is calling back for her lab results.

## 2022-09-25 NOTE — Progress Notes (Signed)
Ref placed to nephrology 

## 2022-09-25 NOTE — Telephone Encounter (Signed)
Patient called and given results from message from Dr. Ronnald Ramp 09/25/22 and patient has concerns of "renal failure" and "going on dialysis". Recommended that patient review results with PCP if needed and allow nephrology to contact her to run additional tests if needed to provide rationale for elevated results. Patient very concerned and NT attempted to provide education and emotional support regarding lab results. Encouraged patient that she can call back if needed and PCP is aware and recommending referral for patient to get answers regarding lab results and future treatment. Patient denies any sx at this time.    Reason for Disposition  [1] Caller requesting NON-URGENT health information AND [2] PCP's office is the best resource  Answer Assessment - Initial Assessment Questions 1. REASON FOR CALL or QUESTION: "What is your reason for calling today?" or "How can I best help you?" or "What question do you have that I can help answer?"     Patient requesting more information regarding labs and protein/creatinine ratio and referral to nephrology.  Protocols used: Information Only Call - No Triage-A-AH

## 2022-09-25 NOTE — Telephone Encounter (Signed)
Called patient back to give lab results. Patient wanted to know more about her elevated renal lab results. Advise patient to nurse triage for further explanation.

## 2022-09-25 NOTE — Progress Notes (Signed)
Noted  Telephone call was mad with the additional questions.  KP

## 2022-09-26 ENCOUNTER — Telehealth: Payer: Self-pay | Admitting: Family Medicine

## 2022-09-26 NOTE — Telephone Encounter (Signed)
Please review.  KP

## 2022-09-26 NOTE — Telephone Encounter (Signed)
Pt is calling concerned about her lab results and referral to nephrology. Advised to pt that Jaclyn Fields is out of the office today, Pt is fine waiting until tomorrow until Jaclyn Fields is able to return her call.

## 2022-09-27 ENCOUNTER — Telehealth: Payer: Self-pay

## 2022-09-27 NOTE — Telephone Encounter (Signed)
Called pt- went over labs and explained why she was going to nephrology. We are checking on referral this am

## 2022-09-30 DIAGNOSIS — Z6838 Body mass index (BMI) 38.0-38.9, adult: Secondary | ICD-10-CM | POA: Diagnosis not present

## 2022-09-30 DIAGNOSIS — R635 Abnormal weight gain: Secondary | ICD-10-CM | POA: Diagnosis not present

## 2022-09-30 DIAGNOSIS — R7303 Prediabetes: Secondary | ICD-10-CM | POA: Diagnosis not present

## 2022-09-30 DIAGNOSIS — B354 Tinea corporis: Secondary | ICD-10-CM | POA: Diagnosis not present

## 2022-10-02 DIAGNOSIS — L239 Allergic contact dermatitis, unspecified cause: Secondary | ICD-10-CM | POA: Diagnosis not present

## 2022-10-03 ENCOUNTER — Other Ambulatory Visit: Payer: Self-pay | Admitting: Nephrology

## 2022-10-03 DIAGNOSIS — R809 Proteinuria, unspecified: Secondary | ICD-10-CM

## 2022-10-03 DIAGNOSIS — I10 Essential (primary) hypertension: Secondary | ICD-10-CM | POA: Diagnosis not present

## 2022-10-17 ENCOUNTER — Ambulatory Visit
Admission: RE | Admit: 2022-10-17 | Discharge: 2022-10-17 | Disposition: A | Discharge status: Home / Self Care | Payer: BC Managed Care – PPO | Source: Ambulatory Visit | Attending: Nephrology | Admitting: Nephrology

## 2022-10-17 DIAGNOSIS — R809 Proteinuria, unspecified: Secondary | ICD-10-CM | POA: Diagnosis not present

## 2022-10-17 DIAGNOSIS — I10 Essential (primary) hypertension: Secondary | ICD-10-CM | POA: Diagnosis not present

## 2022-10-22 ENCOUNTER — Encounter: Payer: Self-pay | Admitting: Family Medicine

## 2022-10-22 ENCOUNTER — Ambulatory Visit (INDEPENDENT_AMBULATORY_CARE_PROVIDER_SITE_OTHER): Payer: BC Managed Care – PPO | Admitting: Family Medicine

## 2022-10-22 VITALS — BP 120/82 | HR 75 | Ht 63.0 in | Wt 216.0 lb

## 2022-10-22 DIAGNOSIS — I10 Essential (primary) hypertension: Secondary | ICD-10-CM | POA: Diagnosis not present

## 2022-10-22 NOTE — Progress Notes (Signed)
Date:  10/22/2022   Name:  Jaclyn Fields   DOB:  04/20/1971   MRN:  [id number]   Chief Complaint: Hypertension (Decreased 10 to 5 of amlodipine and Lateef added Losartan)  Hypertension This is a chronic problem. The current episode started more than 1 year ago. The problem has been gradually improving since onset. The problem is controlled. Pertinent negatives include no anxiety, blurred vision, chest pain, headaches, malaise/fatigue, neck pain, orthopnea, palpitations, peripheral edema, PND, shortness of breath or sweats. There are no associated agents to hypertension. There are no known risk factors for coronary artery disease. Past treatments include angiotensin blockers, beta blockers and diuretics. The current treatment provides moderate improvement. There are no compliance problems.  There is no history of angina, kidney disease, CAD/MI, CVA, heart failure, left ventricular hypertrophy, PVD or retinopathy. There is no history of chronic renal disease, a hypertension causing med or renovascular disease.    Lab Results  Component Value Date   NA 138 06/26/2022   K 3.8 06/26/2022   CO2 27 06/26/2022   GLUCOSE 110 (H) 06/26/2022   BUN 22 06/26/2022   CREATININE 0.98 06/26/2022   CALCIUM 9.7 06/26/2022   EGFR 70 06/26/2022   GFRNONAA >60 04/27/2020   Lab Results  Component Value Date   CHOL 198 02/07/2022   HDL 54 02/07/2022   LDLCALC 127 (H) 02/07/2022   TRIG 96 02/07/2022   CHOLHDL 3.2 04/12/2016   Lab Results  Component Value Date   TSH 1.73 03/03/2020   No results found for: "HGBA1C" Lab Results  Component Value Date   WBC 11.9 (H) 07/24/2020   HGB 11.6 (L) 07/24/2020   HCT 36.5 07/24/2020   MCV 85.9 07/24/2020   PLT 494 (H) 07/24/2020   Lab Results  Component Value Date   ALT 9 02/07/2022   AST 10 02/07/2022   ALKPHOS 111 02/07/2022   BILITOT 0.3 02/07/2022   Lab Results  Component Value Date   VD25OH 20 (L) 01/12/2019     Review of Systems   Constitutional:  Negative for malaise/fatigue.  Eyes:  Negative for blurred vision.  Respiratory:  Negative for cough, shortness of breath and wheezing.   Cardiovascular:  Negative for chest pain, palpitations, orthopnea, leg swelling and PND.  Gastrointestinal:  Negative for abdominal distention, abdominal pain and blood in stool.  Musculoskeletal:  Negative for neck pain.  Neurological:  Negative for headaches.    Patient Active Problem List   Diagnosis Date Noted   Postoperative state 05/08/2020   Post-operative state 05/08/2020   Essential hypertension 01/02/2018   Transfusion history 08/12/2012   Postpartum hemorrhage 09/05/2011   PP care - s/p 1C/S 3/1 (breech, [facility]) 08/24/2011   Maternal iron deficiency anemia 08/24/2011   Benign essential hypertension complicating pregnancy, childbirth, and the puerperium 08/23/2011    Allergies  Allergen Reactions   Sulfa Antibiotics Swelling and Rash    SWELLING TO FINGERS    Past Surgical History:  Procedure Laterality Date   AUGMENTATION MAMMAPLASTY Bilateral 2005   BARTHOLIN CYST MARSUPIALIZATION Left 05/08/2020   Procedure: BARTHOLIN CYST MARSUPIALIZATION;  Surgeon: Genia Del, MD;  Location: [facility];  Service: Gynecology;  Laterality: Left;   CESAREAN SECTION     COLONOSCOPY  2018   DILATION AND EVACUATION  09/05/2011   Procedure: DILATATION AND EVACUATION;  Surgeon: Robley Fries, MD;  Location: [facility];  Service: Gynecology;  Laterality: Bilateral;   ROBOTIC ASSISTED SUPRACERVICAL HYSTERECTOMY WITH BILATERAL SALPINGO OOPHERECTOMY Bilateral  05/08/2020   Procedure: XI ROBOTIC ASSISTED SUPRACERVICAL HYSTERECTOMY WITH BILATERAL SALPINGECTOMY;  Surgeon: Genia Del, MD;  Location: [facility];  Service: Gynecology;  Laterality: Bilateral;   TUBAL LIGATION     WISDOM TOOTH EXTRACTION      Social History   Tobacco Use   Smoking status: Never   Smokeless tobacco: Never  Vaping  Use   Vaping Use: Never used  Substance Use Topics   Alcohol use: Yes    Comment: occasionally   Drug use: No     Medication list has been reviewed and updated.  Current Meds  Medication Sig   albuterol (VENTOLIN HFA) 108 (90 Base) MCG/ACT inhaler Inhale 2 puffs into the lungs every 6 (six) hours as needed for wheezing or shortness of breath.   Ascorbic Acid (VITAMIN C GUMMIE PO) Take 1 tablet by mouth daily.   Biotin w/ Vitamins C & E (HAIR/SKIN/NAILS PO) Take 2 tablets by mouth daily.   cetirizine (ZYRTEC) 10 MG tablet Take 1 tablet (10 mg total) by mouth daily.   cholecalciferol (VITAMIN D3) 25 MCG (1000 UNIT) tablet Take 1,000 Units by mouth daily.   hydrochlorothiazide (HYDRODIURIL) 25 MG tablet Take 1 tablet (25 mg total) by mouth daily.   losartan (COZAAR) 50 MG tablet Take 50 mg by mouth daily.   meloxicam (MOBIC) 15 MG tablet Take 1 tablet (15 mg total) by mouth daily.   metoprolol succinate (TOPROL-XL) 100 MG 24 hr tablet TAKE 1 TABLET BY MOUTH DAILY. TAKE WITH OR IMMEDIATELY FOLLOWING A MEAL.   montelukast (SINGULAIR) 10 MG tablet TAKE 1 TABLET BY MOUTH EVERYDAY AT BEDTIME   Multiple Vitamins-Minerals (MULTIVITAMIN GUMMIES WOMENS PO) Take 2 tablets by mouth daily.   mupirocin nasal ointment (BACTROBAN) 2 % Place 1 application into the nose 2 (two) times daily. Use one-half of tube in each nostril twice daily for five (5) days. After application, press sides of nose together and gently massage.   Omega-3 Fatty Acids (FISH OIL PO) Take 1 capsule by mouth daily.   OZEMPIC, 0.25 OR 0.5 MG/DOSE, 2 MG/3ML SOPN    triamcinolone (NASACORT) 55 MCG/ACT AERO nasal inhaler Place 2 sprays into the nose daily.   triamcinolone cream (KENALOG) 0.1 % Apply 1 Application topically 2 (two) times daily.   valACYclovir (VALTREX) 1000 MG tablet Take 1 tablet (1,000 mg total) by mouth as needed (for flare ups).       10/22/2022    3:22 PM 09/23/2022    3:41 PM 08/08/2022    8:35 AM 06/26/2022     8:43 AM  GAD 7 : Generalized Anxiety Score  Nervous, Anxious, on Edge 0 0 0 0  Control/stop worrying 0 0 0 0  Worry too much - different things 0 0 0 0  Trouble relaxing 0 0 0 0  Restless 0 0 0 0  Easily annoyed or irritable 0 0 0 0  Afraid - awful might happen 0 0 0 0  Total GAD 7 Score 0 0 0 0  Anxiety Difficulty Not difficult at all Not difficult at all Not difficult at all Not difficult at all       10/22/2022    3:22 PM 09/23/2022    3:41 PM 08/08/2022    8:35 AM  Depression screen PHQ 2/9  Decreased Interest 0 0 0  Down, Depressed, Hopeless 0 0 0  PHQ - 2 Score 0 0 0  Altered sleeping 0 0 0  Tired, decreased energy 0 0 0  Change in appetite 0 0 0  Feeling bad or failure about yourself  0 0 0  Trouble concentrating 0 0 0  Moving slowly or fidgety/restless 0 0 0  Suicidal thoughts 0 0 0  PHQ-9 Score 0 0 0  Difficult doing work/chores Not difficult at all Not difficult at all Not difficult at all    BP Readings from Last 3 Encounters:  10/22/22 120/82  09/23/22 120/68  08/08/22 (!) 144/94    Physical Exam Vitals and nursing note reviewed.  HENT:     Head: Normocephalic.     Right Ear: Tympanic membrane and ear canal normal.     Left Ear: Tympanic membrane and ear canal normal.     Nose: Nose normal. No congestion.     Mouth/Throat:     Mouth: Mucous membranes are moist.  Eyes:     Pupils: Pupils are equal, round, and reactive to light.  Cardiovascular:     Rate and Rhythm: Normal rate.     Heart sounds: Normal heart sounds. No murmur heard.    No gallop.  Pulmonary:     Effort: Pulmonary effort is normal.     Breath sounds: No wheezing, rhonchi or rales.  Abdominal:     Palpations: There is no hepatomegaly or splenomegaly.     Tenderness: There is no abdominal tenderness. There is no guarding.  Musculoskeletal:     Cervical back: Normal range of motion.  Neurological:     Mental Status: She is alert.     Wt Readings from Last 3 Encounters:  10/22/22  216 lb (98 kg)  09/23/22 220 lb (99.8 kg)  08/08/22 221 lb (100.2 kg)    BP 120/82   Pulse 75   Ht 5\' 3"  (1.6 m)   Wt 216 lb (98 kg)   LMP 04/27/2020 Comment: Supracervical   SpO2 99%   BMI 38.26 kg/m   Assessment and Plan:  1. Essential hypertension Chronic.  Controlled.  Stable.  Blood pressure today is 120/82.  Patient decided to discontinue her amlodipine 5 mg due to to some overall swelling.  She did continue her metoprolol XL 100, hydrochlorothiazide 25 mg, and newly initiated losartan 50 mg once a day.  Reemphasized limiting sodium in her diet as well is eliminating NSAIDs for pain and concentrating more on acetaminophen if necessary.  Will recheck patient in 6 months.   Elizabeth Sauer, MD

## 2022-11-05 DIAGNOSIS — Z6838 Body mass index (BMI) 38.0-38.9, adult: Secondary | ICD-10-CM | POA: Diagnosis not present

## 2022-11-05 DIAGNOSIS — R7303 Prediabetes: Secondary | ICD-10-CM | POA: Diagnosis not present

## 2022-11-05 DIAGNOSIS — R635 Abnormal weight gain: Secondary | ICD-10-CM | POA: Diagnosis not present

## 2022-11-05 DIAGNOSIS — B354 Tinea corporis: Secondary | ICD-10-CM | POA: Diagnosis not present

## 2022-11-06 DIAGNOSIS — R809 Proteinuria, unspecified: Secondary | ICD-10-CM | POA: Diagnosis not present

## 2022-11-06 DIAGNOSIS — N182 Chronic kidney disease, stage 2 (mild): Secondary | ICD-10-CM | POA: Diagnosis not present

## 2022-11-06 DIAGNOSIS — I10 Essential (primary) hypertension: Secondary | ICD-10-CM | POA: Diagnosis not present

## 2022-11-25 ENCOUNTER — Other Ambulatory Visit: Payer: Self-pay | Admitting: Family Medicine

## 2022-11-25 DIAGNOSIS — I10 Essential (primary) hypertension: Secondary | ICD-10-CM

## 2022-12-09 DIAGNOSIS — H2000 Unspecified acute and subacute iridocyclitis: Secondary | ICD-10-CM | POA: Diagnosis not present

## 2022-12-11 DIAGNOSIS — H2000 Unspecified acute and subacute iridocyclitis: Secondary | ICD-10-CM | POA: Diagnosis not present

## 2022-12-13 DIAGNOSIS — Z1589 Genetic susceptibility to other disease: Secondary | ICD-10-CM | POA: Diagnosis not present

## 2022-12-13 DIAGNOSIS — H2000 Unspecified acute and subacute iridocyclitis: Secondary | ICD-10-CM | POA: Diagnosis not present

## 2022-12-18 DIAGNOSIS — H2000 Unspecified acute and subacute iridocyclitis: Secondary | ICD-10-CM | POA: Diagnosis not present

## 2022-12-25 ENCOUNTER — Ambulatory Visit: Payer: BC Managed Care – PPO | Admitting: Family Medicine

## 2022-12-25 DIAGNOSIS — H2000 Unspecified acute and subacute iridocyclitis: Secondary | ICD-10-CM | POA: Diagnosis not present

## 2023-01-01 DIAGNOSIS — Z1589 Genetic susceptibility to other disease: Secondary | ICD-10-CM | POA: Diagnosis not present

## 2023-01-01 DIAGNOSIS — H2000 Unspecified acute and subacute iridocyclitis: Secondary | ICD-10-CM | POA: Diagnosis not present

## 2023-01-07 DIAGNOSIS — E538 Deficiency of other specified B group vitamins: Secondary | ICD-10-CM | POA: Diagnosis not present

## 2023-01-07 DIAGNOSIS — Z79899 Other long term (current) drug therapy: Secondary | ICD-10-CM | POA: Diagnosis not present

## 2023-01-07 DIAGNOSIS — E7800 Pure hypercholesterolemia, unspecified: Secondary | ICD-10-CM | POA: Diagnosis not present

## 2023-01-07 DIAGNOSIS — R7303 Prediabetes: Secondary | ICD-10-CM | POA: Diagnosis not present

## 2023-01-07 DIAGNOSIS — Z6837 Body mass index (BMI) 37.0-37.9, adult: Secondary | ICD-10-CM | POA: Diagnosis not present

## 2023-01-07 DIAGNOSIS — R635 Abnormal weight gain: Secondary | ICD-10-CM | POA: Diagnosis not present

## 2023-01-15 DIAGNOSIS — Z1589 Genetic susceptibility to other disease: Secondary | ICD-10-CM | POA: Diagnosis not present

## 2023-01-15 DIAGNOSIS — H2000 Unspecified acute and subacute iridocyclitis: Secondary | ICD-10-CM | POA: Diagnosis not present

## 2023-01-23 DIAGNOSIS — R350 Frequency of micturition: Secondary | ICD-10-CM | POA: Diagnosis not present

## 2023-01-23 DIAGNOSIS — N3000 Acute cystitis without hematuria: Secondary | ICD-10-CM | POA: Diagnosis not present

## 2023-01-29 DIAGNOSIS — H2000 Unspecified acute and subacute iridocyclitis: Secondary | ICD-10-CM | POA: Diagnosis not present

## 2023-01-29 DIAGNOSIS — Z0100 Encounter for examination of eyes and vision without abnormal findings: Secondary | ICD-10-CM | POA: Diagnosis not present

## 2023-01-29 DIAGNOSIS — Z1589 Genetic susceptibility to other disease: Secondary | ICD-10-CM | POA: Diagnosis not present

## 2023-02-06 ENCOUNTER — Ambulatory Visit: Payer: BC Managed Care – PPO | Admitting: Family Medicine

## 2023-02-07 DIAGNOSIS — R7303 Prediabetes: Secondary | ICD-10-CM | POA: Diagnosis not present

## 2023-02-07 DIAGNOSIS — Z6837 Body mass index (BMI) 37.0-37.9, adult: Secondary | ICD-10-CM | POA: Diagnosis not present

## 2023-02-07 DIAGNOSIS — E7800 Pure hypercholesterolemia, unspecified: Secondary | ICD-10-CM | POA: Diagnosis not present

## 2023-02-07 DIAGNOSIS — E538 Deficiency of other specified B group vitamins: Secondary | ICD-10-CM | POA: Diagnosis not present

## 2023-02-07 DIAGNOSIS — R030 Elevated blood-pressure reading, without diagnosis of hypertension: Secondary | ICD-10-CM | POA: Diagnosis not present

## 2023-02-07 DIAGNOSIS — E6609 Other obesity due to excess calories: Secondary | ICD-10-CM | POA: Diagnosis not present

## 2023-02-14 ENCOUNTER — Ambulatory Visit: Payer: Federal, State, Local not specified - PPO | Admitting: Family Medicine

## 2023-02-18 DIAGNOSIS — Z1589 Genetic susceptibility to other disease: Secondary | ICD-10-CM | POA: Diagnosis not present

## 2023-02-18 DIAGNOSIS — H2000 Unspecified acute and subacute iridocyclitis: Secondary | ICD-10-CM | POA: Diagnosis not present

## 2023-02-20 ENCOUNTER — Ambulatory Visit (INDEPENDENT_AMBULATORY_CARE_PROVIDER_SITE_OTHER): Payer: No Typology Code available for payment source | Admitting: Family Medicine

## 2023-02-20 ENCOUNTER — Encounter: Payer: Self-pay | Admitting: Family Medicine

## 2023-02-20 VITALS — BP 126/80 | HR 78 | Ht 63.0 in | Wt 211.0 lb

## 2023-02-20 DIAGNOSIS — R053 Chronic cough: Secondary | ICD-10-CM | POA: Diagnosis not present

## 2023-02-20 DIAGNOSIS — J301 Allergic rhinitis due to pollen: Secondary | ICD-10-CM | POA: Diagnosis not present

## 2023-02-20 DIAGNOSIS — B009 Herpesviral infection, unspecified: Secondary | ICD-10-CM | POA: Diagnosis not present

## 2023-02-20 DIAGNOSIS — E7801 Familial hypercholesterolemia: Secondary | ICD-10-CM

## 2023-02-20 DIAGNOSIS — U099 Post covid-19 condition, unspecified: Secondary | ICD-10-CM

## 2023-02-20 DIAGNOSIS — I10 Essential (primary) hypertension: Secondary | ICD-10-CM | POA: Diagnosis not present

## 2023-02-20 DIAGNOSIS — E78019 Familial hypercholesterolemia, unspecified: Secondary | ICD-10-CM

## 2023-02-20 MED ORDER — HYDROCHLOROTHIAZIDE 25 MG PO TABS
25.0000 mg | ORAL_TABLET | Freq: Every day | ORAL | 1 refills | Status: DC
Start: 2023-02-20 — End: 2023-10-06

## 2023-02-20 MED ORDER — VALACYCLOVIR HCL 1 G PO TABS
1000.0000 mg | ORAL_TABLET | ORAL | 5 refills | Status: AC | PRN
Start: 2023-02-20 — End: ?

## 2023-02-20 MED ORDER — METOPROLOL SUCCINATE ER 100 MG PO TB24
ORAL_TABLET | ORAL | 1 refills | Status: DC
Start: 2023-02-20 — End: 2023-06-27

## 2023-02-20 MED ORDER — MONTELUKAST SODIUM 10 MG PO TABS
ORAL_TABLET | ORAL | 1 refills | Status: DC
Start: 2023-02-20 — End: 2023-10-06

## 2023-02-20 NOTE — Progress Notes (Signed)
Date:  02/20/2023   Name:  Jaclyn Fields   DOB:  1970-10-25   MRN:  [id number]   Chief Complaint: Hypertension, Allergic Rhinitis , and viral syndrome  Hypertension The problem has been gradually improving since onset. The problem is controlled. Pertinent negatives include no chest pain, palpitations or shortness of breath. Risk factors for coronary artery disease include dyslipidemia. Past treatments include beta blockers. The current treatment provides moderate improvement. There are no compliance problems.  There is no history of angina, CAD/MI or CVA. There is no history of chronic renal disease, a hypertension causing med or renovascular disease.    Lab Results  Component Value Date   NA 138 06/26/2022   K 3.8 06/26/2022   CO2 27 06/26/2022   GLUCOSE 110 (H) 06/26/2022   BUN 22 06/26/2022   CREATININE 0.98 06/26/2022   CALCIUM 9.7 06/26/2022   EGFR 70 06/26/2022   GFRNONAA >60 04/27/2020   Lab Results  Component Value Date   CHOL 198 02/07/2022   HDL 54 02/07/2022   LDLCALC 127 (H) 02/07/2022   TRIG 96 02/07/2022   CHOLHDL 3.2 04/12/2016   Lab Results  Component Value Date   TSH 1.73 03/03/2020   No results found for: "HGBA1C" Lab Results  Component Value Date   WBC 11.9 (H) 07/24/2020   HGB 11.6 (L) 07/24/2020   HCT 36.5 07/24/2020   MCV 85.9 07/24/2020   PLT 494 (H) 07/24/2020   Lab Results  Component Value Date   ALT 9 02/07/2022   AST 10 02/07/2022   ALKPHOS 111 02/07/2022   BILITOT 0.3 02/07/2022   Lab Results  Component Value Date   VD25OH 20 (L) 01/12/2019     Review of Systems  HENT:  Negative for postnasal drip, sinus pressure and trouble swallowing.   Respiratory:  Negative for choking, chest tightness, shortness of breath and wheezing.   Cardiovascular:  Positive for leg swelling. Negative for chest pain and palpitations.  Gastrointestinal:  Negative for abdominal pain and blood in stool.  Endocrine: Negative for polydipsia and  polyuria.  Genitourinary:  Negative for difficulty urinating and vaginal bleeding.    Patient Active Problem List   Diagnosis Date Noted   Postoperative state 05/08/2020   Post-operative state 05/08/2020   Essential hypertension 01/02/2018   Transfusion history 08/12/2012   Postpartum hemorrhage 09/05/2011   PP care - s/p 1C/S 3/1 (breech, [facility]) 08/24/2011   Maternal iron deficiency anemia 08/24/2011   Benign essential hypertension complicating pregnancy, childbirth, and the puerperium 08/23/2011    Allergies  Allergen Reactions   Sulfa Antibiotics Swelling and Rash    SWELLING TO FINGERS    Past Surgical History:  Procedure Laterality Date   AUGMENTATION MAMMAPLASTY Bilateral 2005   BARTHOLIN CYST MARSUPIALIZATION Left 05/08/2020   Procedure: BARTHOLIN CYST MARSUPIALIZATION;  Surgeon: Genia Del, MD;  Location: [facility];  Service: Gynecology;  Laterality: Left;   CESAREAN SECTION     COLONOSCOPY  2018   DILATION AND EVACUATION  09/05/2011   Procedure: DILATATION AND EVACUATION;  Surgeon: Robley Fries, MD;  Location: [facility];  Service: Gynecology;  Laterality: Bilateral;   ROBOTIC ASSISTED SUPRACERVICAL HYSTERECTOMY WITH BILATERAL SALPINGO OOPHERECTOMY Bilateral 05/08/2020   Procedure: XI ROBOTIC ASSISTED SUPRACERVICAL HYSTERECTOMY WITH BILATERAL SALPINGECTOMY;  Surgeon: Genia Del, MD;  Location: [facility] ;  Service: Gynecology;  Laterality: Bilateral;   TUBAL LIGATION     WISDOM TOOTH EXTRACTION      Social History  Tobacco Use   Smoking status: Never   Smokeless tobacco: Never  Vaping Use   Vaping status: Never Used  Substance Use Topics   Alcohol use: Yes    Comment: occasionally   Drug use: No     Medication list has been reviewed and updated.  Current Meds  Medication Sig   albuterol (VENTOLIN HFA) 108 (90 Base) MCG/ACT inhaler Inhale 2 puffs into the lungs every 6 (six) hours as needed for wheezing or  shortness of breath.   Ascorbic Acid (VITAMIN C GUMMIE PO) Take 1 tablet by mouth daily.   Biotin w/ Vitamins C & E (HAIR/SKIN/NAILS PO) Take 2 tablets by mouth daily.   cetirizine (ZYRTEC) 10 MG tablet Take 1 tablet (10 mg total) by mouth daily.   cholecalciferol (VITAMIN D3) 25 MCG (1000 UNIT) tablet Take 1,000 Units by mouth daily.   hydrochlorothiazide (HYDRODIURIL) 25 MG tablet Take 1 tablet (25 mg total) by mouth daily.   losartan (COZAAR) 50 MG tablet Take 50 mg by mouth daily.   meloxicam (MOBIC) 15 MG tablet Take 1 tablet (15 mg total) by mouth daily.   metoprolol succinate (TOPROL-XL) 100 MG 24 hr tablet TAKE 1 TABLET BY MOUTH DAILY. TAKE WITH OR IMMEDIATELY FOLLOWING A MEAL.   montelukast (SINGULAIR) 10 MG tablet TAKE 1 TABLET BY MOUTH EVERYDAY AT BEDTIME   Multiple Vitamins-Minerals (MULTIVITAMIN GUMMIES WOMENS PO) Take 2 tablets by mouth daily.   mupirocin nasal ointment (BACTROBAN) 2 % Place 1 application into the nose 2 (two) times daily. Use one-half of tube in each nostril twice daily for five (5) days. After application, press sides of nose together and gently massage.   Omega-3 Fatty Acids (FISH OIL PO) Take 1 capsule by mouth daily.   OZEMPIC, 0.25 OR 0.5 MG/DOSE, 2 MG/3ML SOPN    triamcinolone (NASACORT) 55 MCG/ACT AERO nasal inhaler Place 2 sprays into the nose daily.   triamcinolone cream (KENALOG) 0.1 % Apply 1 Application topically 2 (two) times daily.   valACYclovir (VALTREX) 1000 MG tablet Take 1 tablet (1,000 mg total) by mouth as needed (for flare ups).   [DISCONTINUED] amLODipine (NORVASC) 5 MG tablet TAKE 1 TABLET (5 MG TOTAL) BY MOUTH DAILY.       10/22/2022    3:22 PM 09/23/2022    3:41 PM 08/08/2022    8:35 AM 06/26/2022    8:43 AM  GAD 7 : Generalized Anxiety Score  Nervous, Anxious, on Edge 0 0 0 0  Control/stop worrying 0 0 0 0  Worry too much - different things 0 0 0 0  Trouble relaxing 0 0 0 0  Restless 0 0 0 0  Easily annoyed or irritable 0 0 0 0   Afraid - awful might happen 0 0 0 0  Total GAD 7 Score 0 0 0 0  Anxiety Difficulty Not difficult at all Not difficult at all Not difficult at all Not difficult at all       10/22/2022    3:22 PM 09/23/2022    3:41 PM 08/08/2022    8:35 AM  Depression screen PHQ 2/9  Decreased Interest 0 0 0  Down, Depressed, Hopeless 0 0 0  PHQ - 2 Score 0 0 0  Altered sleeping 0 0 0  Tired, decreased energy 0 0 0  Change in appetite 0 0 0  Feeling bad or failure about yourself  0 0 0  Trouble concentrating 0 0 0  Moving slowly or fidgety/restless 0 0 0  Suicidal thoughts 0 0 0  PHQ-9 Score 0 0 0  Difficult doing work/chores Not difficult at all Not difficult at all Not difficult at all    BP Readings from Last 3 Encounters:  02/20/23 126/80  10/22/22 120/82  09/23/22 120/68    Physical Exam Vitals and nursing note reviewed. Exam conducted with a chaperone present.  Constitutional:      General: She is not in acute distress.    Appearance: She is not diaphoretic.  HENT:     Head: Normocephalic and atraumatic.     Right Ear: Tympanic membrane and external ear normal.     Left Ear: Tympanic membrane and external ear normal.     Nose: Nose normal. No congestion or rhinorrhea.     Mouth/Throat:     Mouth: Mucous membranes are moist.     Pharynx: Oropharynx is clear. No oropharyngeal exudate or posterior oropharyngeal erythema.  Eyes:     General:        Right eye: No discharge.        Left eye: No discharge.     Conjunctiva/sclera: Conjunctivae normal.     Pupils: Pupils are equal, round, and reactive to light.  Neck:     Thyroid: No thyromegaly.     Vascular: No JVD.  Cardiovascular:     Rate and Rhythm: Normal rate and regular rhythm.     Heart sounds: Normal heart sounds. No murmur heard.    No friction rub. No gallop.  Pulmonary:     Effort: Pulmonary effort is normal.     Breath sounds: Normal breath sounds. No wheezing, rhonchi or rales.  Abdominal:     General: Bowel  sounds are normal.     Palpations: Abdomen is soft. There is no mass.     Tenderness: There is no abdominal tenderness. There is no guarding.  Musculoskeletal:        General: Normal range of motion.     Cervical back: Normal range of motion and neck supple.  Lymphadenopathy:     Cervical: No cervical adenopathy.  Skin:    General: Skin is warm and dry.  Neurological:     Mental Status: She is alert.     Deep Tendon Reflexes: Reflexes are normal and symmetric.     Wt Readings from Last 3 Encounters:  02/20/23 211 lb (95.7 kg)  10/22/22 216 lb (98 kg)  09/23/22 220 lb (99.8 kg)    BP 126/80 (BP Location: Right Arm, Cuff Size: Large)   Pulse 78   Ht 5\' 3"  (1.6 m)   Wt 211 lb (95.7 kg)   LMP 04/27/2020 Comment: Supracervical   SpO2 97%   BMI 37.38 kg/m   Assessment and Plan: 1. Essential hypertension Chronic.  Controlled.  Stable.  Blood pressure 126/80.  Asymptomatic.  Tolerating medication well.  Continue hydrochlorothiazide 25 mg once a day and metoprolol XL 100 mg once a day.  Patient has discontinued her amlodipine 5 mg due to ankle swelling and we will continue with discontinuance of this medication. - hydrochlorothiazide (HYDRODIURIL) 25 MG tablet; Take 1 tablet (25 mg total) by mouth daily.  Dispense: 90 tablet; Refill: 1 - metoprolol succinate (TOPROL-XL) 100 MG 24 hr tablet; TAKE 1 TABLET BY MOUTH DAILY. TAKE WITH OR IMMEDIATELY FOLLOWING A MEAL.  Dispense: 90 tablet; Refill: 1  2. Seasonal allergic rhinitis due to pollen Chronic.  Controlled.  Stable.  Continue Singulair 10 mg once a day. - montelukast (SINGULAIR) 10 MG tablet; TAKE  1 TABLET BY MOUTH EVERYDAY AT BEDTIME  Dispense: 90 tablet; Refill: 1  3. Post-COVID chronic cough As noted above. - montelukast (SINGULAIR) 10 MG tablet; TAKE 1 TABLET BY MOUTH EVERYDAY AT BEDTIME  Dispense: 90 tablet; Refill: 1  4. HSV-1 (herpes simplex virus 1) infection Chronic.  Controlled.  Stable.  Continue valacyclovir 1 g 2  tablets twice a day for 1 day as needed for outbreak of HSV 1. - valACYclovir (VALTREX) 1000 MG tablet; Take 1 tablet (1,000 mg total) by mouth as needed (for flare ups).  Dispense: 10 tablet; Refill: 5  5. Familial hypercholesterolemia Chronic.  Controlled.  Stable.  This is dietarily controlled and patient's been reemphasized with a low-cholesterol overall triglyceride dietary guidelines.  Will check lipid panel for current status of LDL and will recheck in 6 months or as needed. - Lipid Panel With LDL/HDL Ratio     Elizabeth Sauer, MD

## 2023-04-23 ENCOUNTER — Other Ambulatory Visit: Payer: Self-pay | Admitting: Family Medicine

## 2023-04-23 DIAGNOSIS — Z1231 Encounter for screening mammogram for malignant neoplasm of breast: Secondary | ICD-10-CM

## 2023-04-24 ENCOUNTER — Ambulatory Visit: Payer: BC Managed Care – PPO | Admitting: Family Medicine

## 2023-04-28 ENCOUNTER — Ambulatory Visit: Payer: BC Managed Care – PPO

## 2023-04-28 ENCOUNTER — Other Ambulatory Visit: Payer: Self-pay | Admitting: Family Medicine

## 2023-04-28 DIAGNOSIS — Z1231 Encounter for screening mammogram for malignant neoplasm of breast: Secondary | ICD-10-CM

## 2023-05-13 ENCOUNTER — Encounter: Payer: Self-pay | Admitting: Obstetrics and Gynecology

## 2023-05-13 ENCOUNTER — Ambulatory Visit (INDEPENDENT_AMBULATORY_CARE_PROVIDER_SITE_OTHER): Payer: No Typology Code available for payment source | Admitting: Obstetrics and Gynecology

## 2023-05-13 VITALS — BP 122/80 | HR 68 | Wt 212.0 lb

## 2023-05-13 DIAGNOSIS — R350 Frequency of micturition: Secondary | ICD-10-CM | POA: Diagnosis not present

## 2023-05-13 LAB — URINALYSIS, COMPLETE W/RFL CULTURE
Bacteria, UA: NONE SEEN /[HPF]
Bilirubin Urine: NEGATIVE
Glucose, UA: NEGATIVE
Hgb urine dipstick: NEGATIVE
Hyaline Cast: NONE SEEN /[LPF]
Ketones, ur: NEGATIVE
Leukocyte Esterase: NEGATIVE
Nitrites, Initial: NEGATIVE
RBC / HPF: NONE SEEN /[HPF] (ref 0–2)
Specific Gravity, Urine: 1.02 (ref 1.001–1.035)
WBC, UA: NONE SEEN /[HPF] (ref 0–5)
pH: 6.5 (ref 5.0–8.0)

## 2023-05-13 LAB — NO CULTURE INDICATED

## 2023-05-13 NOTE — Patient Instructions (Signed)
Increase hydration, avoid bladder irritants and consider use of cranberry supplements or AZO.

## 2023-05-13 NOTE — Progress Notes (Signed)
52 y.o. G25P1001 female here for urinary symptoms.  Patient's last menstrual period was 04/27/2020.   UTI symptoms x 1 week, Pt states she took left over Keflex medication for 3 days one tablet bid, symptoms got worse 2 days ago. Frequent urination and urgency, very little void each time. Pt states she had nausea since this morning.   OB History  Gravida Para Term Preterm AB Living  1 1 1    0 1  SAB IAB Ectopic Multiple Live Births    0     1    # Outcome Date GA Lbr Len/2nd Weight Sex Type Anes PTL Lv  1 Term 08/23/11 [redacted]w[redacted]d  6 lb 10.9 oz (3.03 kg) F CS-LTranv Spinal  LIV    Past Medical History:  Diagnosis Date   Allergy    Bartholin's duct cyst    Bartholin duct cyst 2 left last one 2015   Family history of adverse reaction to anesthesia    N&V   HSV (herpes simplex virus) infection    oral cold sores   Hypertension    Maternal iron deficiency anemia 08/24/2011   PP care - s/p 1C/S (breech, [facility]) 08/24/2011    Past Surgical History:  Procedure Laterality Date   AUGMENTATION MAMMAPLASTY Bilateral 2005   BARTHOLIN CYST MARSUPIALIZATION Left 05/08/2020   Procedure: BARTHOLIN CYST MARSUPIALIZATION;  Surgeon: Genia Del, MD;  Location: [facility] [place];  Service: Gynecology;  Laterality: Left;   CESAREAN SECTION     COLONOSCOPY  2018   DILATION AND EVACUATION  09/05/2011   Procedure: DILATATION AND EVACUATION;  Surgeon: Robley Fries, MD;  Location: [facility];  Service: Gynecology;  Laterality: Bilateral;   ROBOTIC ASSISTED SUPRACERVICAL HYSTERECTOMY WITH BILATERAL SALPINGO OOPHERECTOMY Bilateral 05/08/2020   Procedure: XI ROBOTIC ASSISTED SUPRACERVICAL HYSTERECTOMY WITH BILATERAL SALPINGECTOMY;  Surgeon: Genia Del, MD;  Location: [facility];  Service: Gynecology;  Laterality: Bilateral;   TUBAL LIGATION     WISDOM TOOTH EXTRACTION      Current Outpatient Medications on File Prior to Visit  Medication Sig Dispense Refill    albuterol (VENTOLIN HFA) 108 (90 Base) MCG/ACT inhaler Inhale 2 puffs into the lungs every 6 (six) hours as needed for wheezing or shortness of breath. 8 g 2   Ascorbic Acid (VITAMIN C GUMMIE PO) Take 1 tablet by mouth daily.     Biotin w/ Vitamins C & E (HAIR/SKIN/NAILS PO) Take 2 tablets by mouth daily.     cetirizine (ZYRTEC) 10 MG tablet Take 1 tablet (10 mg total) by mouth daily. 90 tablet 3   cholecalciferol (VITAMIN D3) 25 MCG (1000 UNIT) tablet Take 1,000 Units by mouth daily.     Difluprednate 0.05 % EMUL Place 1 drop into the left eye daily.     hydrochlorothiazide (HYDRODIURIL) 25 MG tablet Take 1 tablet (25 mg total) by mouth daily. 90 tablet 1   losartan (COZAAR) 50 MG tablet Take 50 mg by mouth daily.     meloxicam (MOBIC) 15 MG tablet Take 1 tablet (15 mg total) by mouth daily. 30 tablet 0   metoprolol succinate (TOPROL-XL) 100 MG 24 hr tablet TAKE 1 TABLET BY MOUTH DAILY. TAKE WITH OR IMMEDIATELY FOLLOWING A MEAL. 90 tablet 1   montelukast (SINGULAIR) 10 MG tablet TAKE 1 TABLET BY MOUTH EVERYDAY AT BEDTIME 90 tablet 1   Multiple Vitamins-Minerals (MULTIVITAMIN GUMMIES WOMENS PO) Take 2 tablets by mouth daily.     Omega-3 Fatty Acids (FISH OIL PO) Take  1 capsule by mouth daily.     OZEMPIC, 0.25 OR 0.5 MG/DOSE, 2 MG/3ML SOPN      triamcinolone (NASACORT) 55 MCG/ACT AERO nasal inhaler Place 2 sprays into the nose daily. 1 each 12   valACYclovir (VALTREX) 1000 MG tablet Take 1 tablet (1,000 mg total) by mouth as needed (for flare ups). 10 tablet 5   mupirocin nasal ointment (BACTROBAN) 2 % Place 1 application into the nose 2 (two) times daily. Use one-half of tube in each nostril twice daily for five (5) days. After application, press sides of nose together and gently massage. (Patient not taking: Reported on 05/13/2023) 10 g 0   triamcinolone cream (KENALOG) 0.1 % Apply 1 Application topically 2 (two) times daily. (Patient not taking: Reported on 05/13/2023) 30 g 2   No current  facility-administered medications on file prior to visit.    Allergies  Allergen Reactions   Sulfa Antibiotics Swelling and Rash    SWELLING TO FINGERS      PE Today's Vitals   05/13/23 1409  BP: 122/80  Pulse: 68  SpO2: 99%  Weight: 212 lb (96.2 kg)   Body mass index is 37.55 kg/m.  Physical Exam Vitals reviewed.  Constitutional:      General: She is not in acute distress.    Appearance: Normal appearance.  HENT:     Head: Normocephalic and atraumatic.     Nose: Nose normal.  Eyes:     Extraocular Movements: Extraocular movements intact.     Conjunctiva/sclera: Conjunctivae normal.  Pulmonary:     Effort: Pulmonary effort is normal.  Musculoskeletal:        General: Normal range of motion.     Cervical back: Normal range of motion.  Neurological:     General: No focal deficit present.     Mental Status: She is alert.  Psychiatric:        Mood and Affect: Mood normal.        Behavior: Behavior normal.       Assessment and Plan:        Urinary frequency -     Urinalysis,Complete w/RFL Culture -     Urine Culture   Instructed to increase PO hydration, avoid bladder irritants and consider use of cranberry supplements or AZO.  Rosalyn Gess, MD

## 2023-05-14 LAB — URINE CULTURE
MICRO NUMBER:: 15750353
Result:: NO GROWTH
SPECIMEN QUALITY:: ADEQUATE

## 2023-05-20 ENCOUNTER — Ambulatory Visit: Payer: Federal, State, Local not specified - PPO

## 2023-06-20 ENCOUNTER — Ambulatory Visit: Payer: Federal, State, Local not specified - PPO

## 2023-06-26 IMAGING — MG MM  DIGITAL DIAGNOSTIC BREAST BILAT IMPLANT W/ TOMO W/ CAD
8 of 12 series · 8 of 28 positions shown · non-contrast
Comparison: Previous exam(s).

CLINICAL DATA: 50-year-old female presenting for evaluation of
bilateral diffuse pain and bilateral nipple sensitivity.

EXAM:
DIGITAL DIAGNOSTIC BILATERAL MAMMOGRAM WITH IMPLANTS, CAD AND
TOMOSYNTHESIS
TECHNIQUE: Bilateral digital diagnostic mammography and breast tomosynthesis
was performed. The images were evaluated with computer-aided
detection. Standard and/or implant displaced views were performed.

[L MLO]
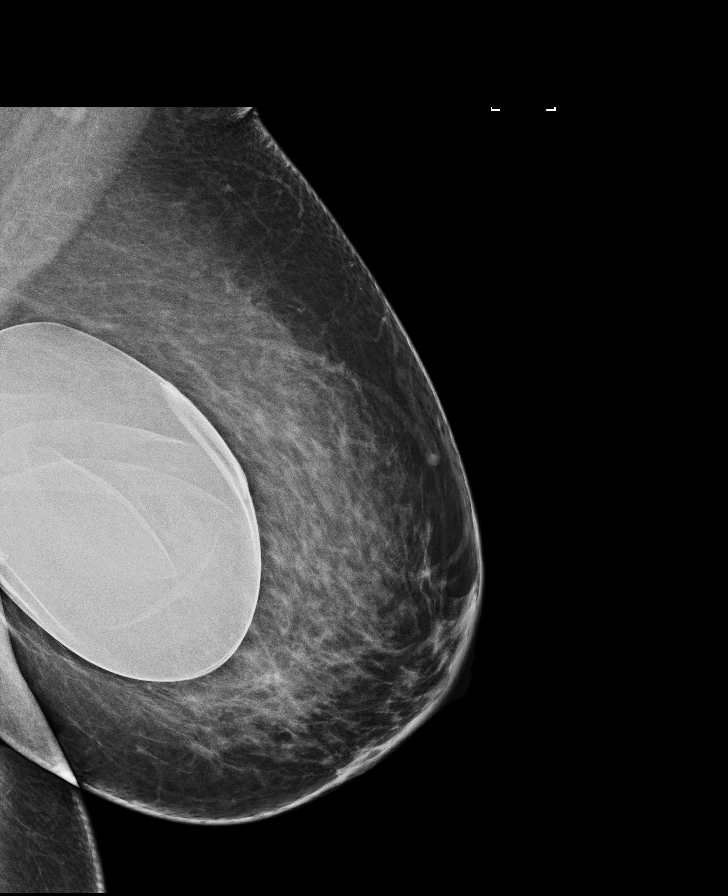

[R MLO]
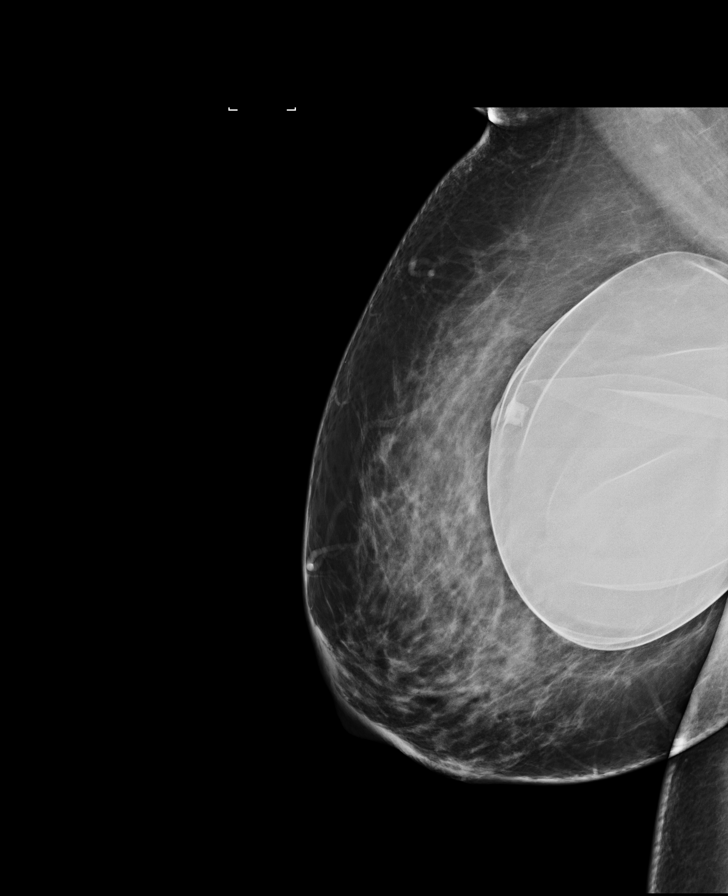

[R CC]
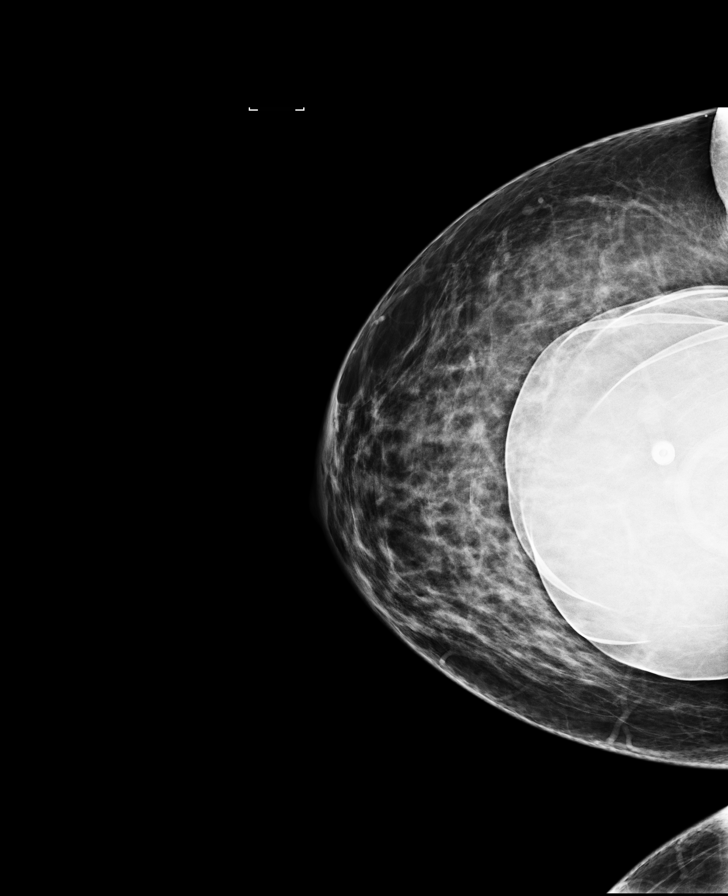

[L CC]
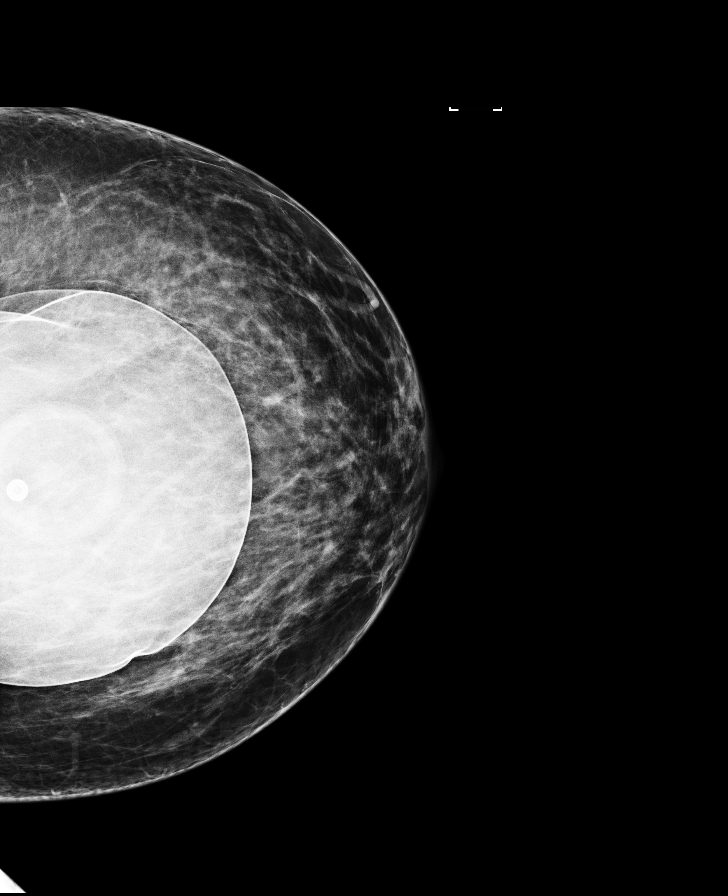

[R MLO synth-2D]
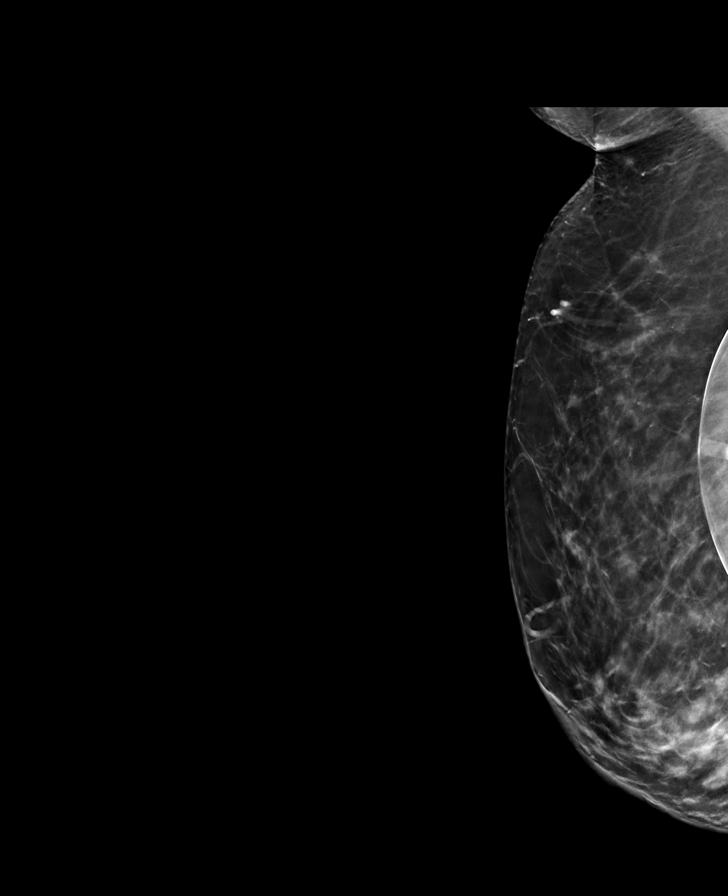

[R CC synth-2D]
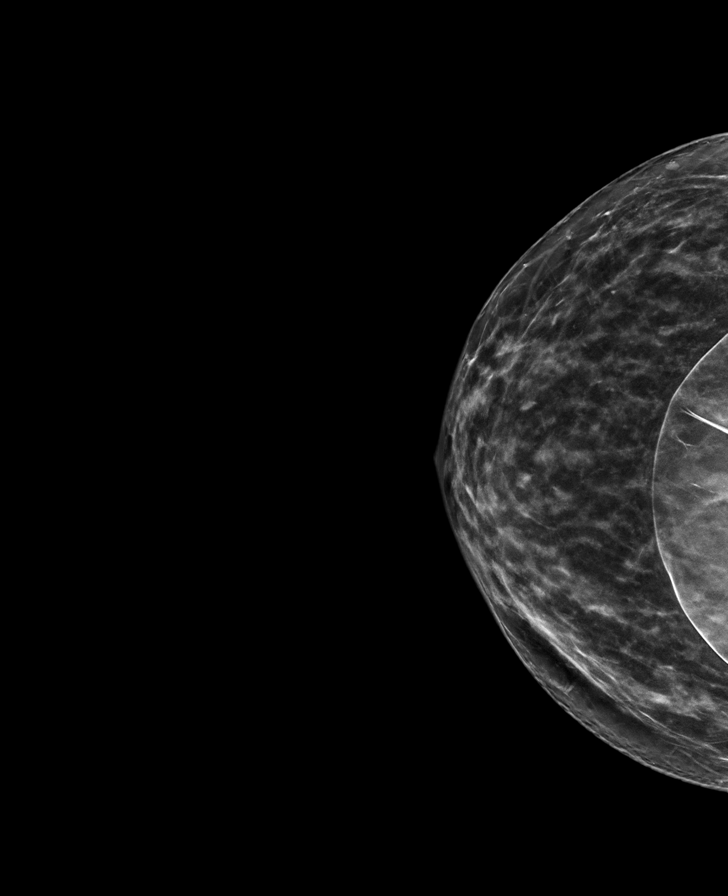

[L CC synth-2D]
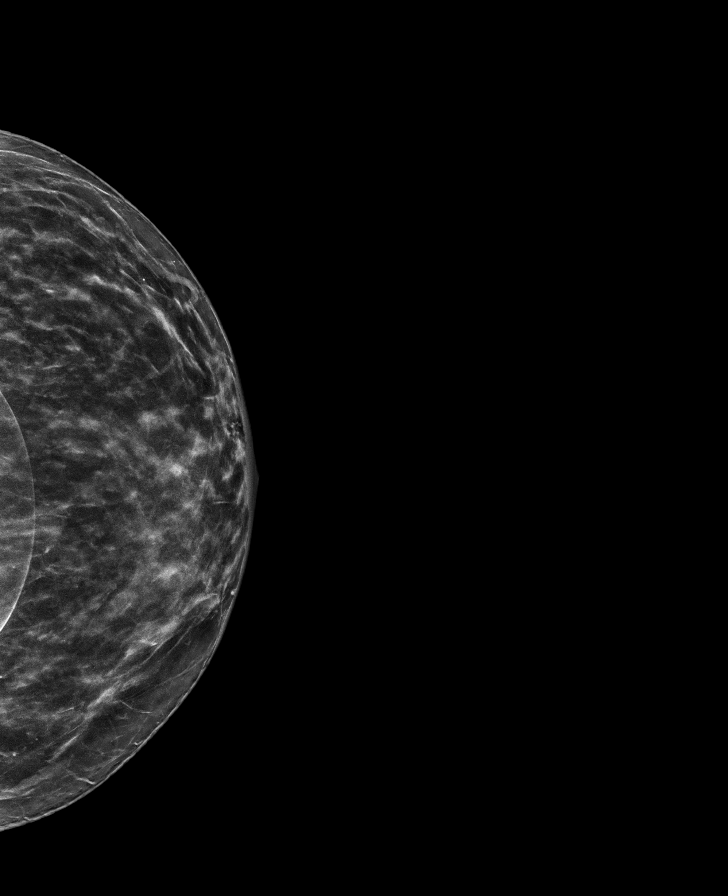

[L MLO synth-2D]
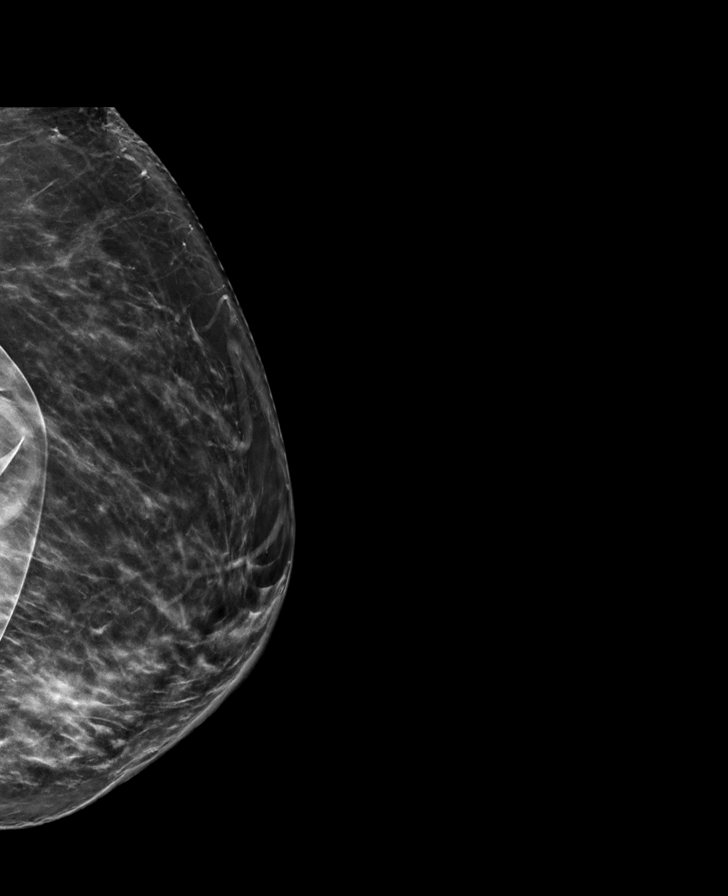

[8 of 28 positions shown; findings below may reference images not displayed]

ACR Breast Density Category c: The breast tissue is heterogeneously
dense, which may obscure small masses.
FINDINGS: No suspicious calcifications, masses or areas of distortion are seen
in the bilateral breasts. No abnormal findings in the breasts
bilaterally, or in the retroareolar bilateral breasts to explain the
patient's breast pain/sensitivity. The patient has retroglandular
implants.
IMPRESSION: 1. There are no suspicious mammographic findings to explain the
patient's bilateral breast pain and nipple sensitivity.

2.  No mammographic evidence of malignancy in the bilateral breasts.

RECOMMENDATION:
1. Breast pain is a common condition, which will often resolve on
its own without intervention. It can be affected by hormonal
changes, medication side effect, weight changes and fit of the bra.
Pain may also be referred from other adjacent areas of the body.
Breast pain may be improved by wearing adequate well-fitting
support, over-the-counter topical and oral NSAID medication, low-fat
diet, and ice/heat as needed. Studies have shown an improvement in
cyclic pain with use of evening primrose oil and vitamin E. Clinical
follow-up recommended to discuss any further work-up recommendations
and appropriate treatment.

2.  Screening mammogram in one year.(Code:20-S-Y2W)

I have discussed the findings and recommendations with the patient.
If applicable, a reminder letter will be sent to the patient
regarding the next appointment.

BI-RADS CATEGORY  1: Negative.

## 2023-06-27 ENCOUNTER — Telehealth: Payer: Self-pay | Admitting: Family Medicine

## 2023-06-27 ENCOUNTER — Other Ambulatory Visit: Payer: Self-pay

## 2023-06-27 DIAGNOSIS — I10 Essential (primary) hypertension: Secondary | ICD-10-CM

## 2023-06-27 MED ORDER — METOPROLOL SUCCINATE ER 100 MG PO TB24: ORAL_TABLET | ORAL | 0 refills | Status: DC

## 2023-06-27 NOTE — Telephone Encounter (Signed)
 Patient med refill sent to [facility].

## 2023-06-27 NOTE — Telephone Encounter (Signed)
 Pt is calling to speak to Delice Bison regarding previous conversation about metoprolol succinate (TOPROL-XL) 100 MG 24 hr tablet [[id number]] . Would not disclose. Please advise  CB- [phone]

## 2023-07-10 ENCOUNTER — Ambulatory Visit: Payer: Federal, State, Local not specified - PPO

## 2023-07-29 ENCOUNTER — Ambulatory Visit: Payer: No Typology Code available for payment source | Admitting: Radiology

## 2023-08-19 ENCOUNTER — Ambulatory Visit: Payer: No Typology Code available for payment source | Admitting: Nurse Practitioner

## 2023-08-19 ENCOUNTER — Ambulatory Visit: Payer: No Typology Code available for payment source | Admitting: Radiology

## 2023-08-20 ENCOUNTER — Ambulatory Visit
Admission: RE | Admit: 2023-08-20 | Discharge: 2023-08-20 | Disposition: A | Discharge status: Home / Self Care | Payer: 59 | Source: Ambulatory Visit | Attending: Family Medicine | Admitting: Family Medicine

## 2023-08-20 DIAGNOSIS — Z1231 Encounter for screening mammogram for malignant neoplasm of breast: Secondary | ICD-10-CM

## 2023-08-22 ENCOUNTER — Ambulatory Visit: Payer: 59 | Admitting: Family Medicine

## 2023-08-22 ENCOUNTER — Encounter: Payer: Self-pay | Admitting: Family Medicine

## 2023-08-22 VITALS — BP 142/88 | HR 71 | Ht 63.0 in | Wt 204.0 lb

## 2023-08-22 DIAGNOSIS — I10 Essential (primary) hypertension: Secondary | ICD-10-CM

## 2023-08-22 MED ORDER — LOSARTAN POTASSIUM 100 MG PO TABS: 100.0000 mg | ORAL_TABLET | Freq: Every day | ORAL | 0 refills | Status: DC

## 2023-08-22 NOTE — Progress Notes (Signed)
 Date:  08/22/2023   Name:  Jaclyn Fields   DOB:  Sep 05, 1970   MRN:  [id number]   Chief Complaint: Hypertension (Patient taking medication as directed and no concerns for today's visit. )  Hypertension This is a chronic problem. The current episode started more than 1 year ago. The problem has been gradually improving since onset. The problem is controlled. Pertinent negatives include no anxiety, blurred vision, chest pain, headaches, neck pain, orthopnea, palpitations, peripheral edema, PND, shortness of breath or sweats. There are no associated agents to hypertension.    Lab Results  Component Value Date   NA 138 06/26/2022   K 3.8 06/26/2022   CO2 27 06/26/2022   GLUCOSE 110 (H) 06/26/2022   BUN 22 06/26/2022   CREATININE 0.98 06/26/2022   CALCIUM 9.7 06/26/2022   EGFR 70 06/26/2022   GFRNONAA >60 04/27/2020   Lab Results  Component Value Date   CHOL 198 02/07/2022   HDL 54 02/07/2022   LDLCALC 127 (H) 02/07/2022   TRIG 96 02/07/2022   CHOLHDL 3.2 04/12/2016   Lab Results  Component Value Date   TSH 1.73 03/03/2020   No results found for: "HGBA1C" Lab Results  Component Value Date   WBC 11.9 (H) 07/24/2020   HGB 11.6 (L) 07/24/2020   HCT 36.5 07/24/2020   MCV 85.9 07/24/2020   PLT 494 (H) 07/24/2020   Lab Results  Component Value Date   ALT 9 02/07/2022   AST 10 02/07/2022   ALKPHOS 111 02/07/2022   BILITOT 0.3 02/07/2022   Lab Results  Component Value Date   VD25OH 20 (L) 01/12/2019     Review of Systems  Constitutional:  Negative for fatigue and unexpected weight change.  HENT:  Negative for congestion.   Eyes:  Negative for blurred vision and visual disturbance.  Respiratory:  Negative for cough, choking, chest tightness, shortness of breath and wheezing.   Cardiovascular:  Negative for chest pain, palpitations, orthopnea and PND.  Gastrointestinal:  Negative for abdominal distention, blood in stool, constipation and diarrhea.  Musculoskeletal:   Negative for neck pain.  Neurological:  Negative for headaches.    Patient Active Problem List   Diagnosis Date Noted   Postoperative state 05/08/2020   Post-operative state 05/08/2020   Essential hypertension 01/02/2018   Transfusion history 08/12/2012   Postpartum hemorrhage 09/05/2011   PP care - s/p 1C/S 3/1 (breech, [facility]) 08/24/2011   Maternal iron deficiency anemia 08/24/2011   Benign essential hypertension complicating pregnancy, childbirth, and the puerperium 08/23/2011    Allergies  Allergen Reactions   Sulfa Antibiotics Swelling and Rash    SWELLING TO FINGERS    Past Surgical History:  Procedure Laterality Date   AUGMENTATION MAMMAPLASTY Bilateral 2005   BARTHOLIN CYST MARSUPIALIZATION Left 05/08/2020   Procedure: BARTHOLIN CYST MARSUPIALIZATION;  Surgeon: Genia Del, MD;  Location: [facility] [place];  Service: Gynecology;  Laterality: Left;   CESAREAN SECTION     COLONOSCOPY  2018   DILATION AND EVACUATION  09/05/2011   Procedure: DILATATION AND EVACUATION;  Surgeon: Robley Fries, MD;  Location: [facility];  Service: Gynecology;  Laterality: Bilateral;   ROBOTIC ASSISTED SUPRACERVICAL HYSTERECTOMY WITH BILATERAL SALPINGO OOPHERECTOMY Bilateral 05/08/2020   Procedure: XI ROBOTIC ASSISTED SUPRACERVICAL HYSTERECTOMY WITH BILATERAL SALPINGECTOMY;  Surgeon: Genia Del, MD;  Location: [facility];  Service: Gynecology;  Laterality: Bilateral;   TUBAL LIGATION     WISDOM TOOTH EXTRACTION      Social History  Tobacco Use   Smoking status: Never   Smokeless tobacco: Never  Vaping Use   Vaping status: Never Used  Substance Use Topics   Alcohol use: Yes    Comment: occasionally   Drug use: No     Medication list has been reviewed and updated.  Current Meds  Medication Sig   albuterol (VENTOLIN HFA) 108 (90 Base) MCG/ACT inhaler Inhale 2 puffs into the lungs every 6 (six) hours as needed for wheezing or shortness of  breath.   Ascorbic Acid (VITAMIN C GUMMIE PO) Take 1 tablet by mouth daily.   Biotin w/ Vitamins C & E (HAIR/SKIN/NAILS PO) Take 2 tablets by mouth daily.   cetirizine (ZYRTEC) 10 MG tablet Take 1 tablet (10 mg total) by mouth daily.   cholecalciferol (VITAMIN D3) 25 MCG (1000 UNIT) tablet Take 1,000 Units by mouth daily.   Difluprednate 0.05 % EMUL Place 1 drop into the left eye daily.   hydrochlorothiazide (HYDRODIURIL) 25 MG tablet Take 1 tablet (25 mg total) by mouth daily.   losartan (COZAAR) 50 MG tablet Take 50 mg by mouth daily.   meloxicam (MOBIC) 15 MG tablet Take 1 tablet (15 mg total) by mouth daily.   metoprolol succinate (TOPROL-XL) 100 MG 24 hr tablet TAKE 1 TABLET BY MOUTH DAILY. TAKE WITH OR IMMEDIATELY FOLLOWING A MEAL.   montelukast (SINGULAIR) 10 MG tablet TAKE 1 TABLET BY MOUTH EVERYDAY AT BEDTIME   Multiple Vitamins-Minerals (MULTIVITAMIN GUMMIES WOMENS PO) Take 2 tablets by mouth daily.   mupirocin nasal ointment (BACTROBAN) 2 % Place 1 application into the nose 2 (two) times daily. Use one-half of tube in each nostril twice daily for five (5) days. After application, press sides of nose together and gently massage.   Omega-3 Fatty Acids (FISH OIL PO) Take 1 capsule by mouth daily.   OZEMPIC, 0.25 OR 0.5 MG/DOSE, 2 MG/3ML SOPN    triamcinolone (NASACORT) 55 MCG/ACT AERO nasal inhaler Place 2 sprays into the nose daily.   triamcinolone cream (KENALOG) 0.1 % Apply 1 Application topically 2 (two) times daily.   valACYclovir (VALTREX) 1000 MG tablet Take 1 tablet (1,000 mg total) by mouth as needed (for flare ups).       08/22/2023    1:33 PM 10/22/2022    3:22 PM 09/23/2022    3:41 PM 08/08/2022    8:35 AM  GAD 7 : Generalized Anxiety Score  Nervous, Anxious, on Edge 0 0 0 0  Control/stop worrying 0 0 0 0  Worry too much - different things 0 0 0 0  Trouble relaxing 0 0 0 0  Restless 0 0 0 0  Easily annoyed or irritable 0 0 0 0  Afraid - awful might happen 0 0 0 0   Total GAD 7 Score 0 0 0 0  Anxiety Difficulty Not difficult at all Not difficult at all Not difficult at all Not difficult at all       08/22/2023    1:33 PM 10/22/2022    3:22 PM 09/23/2022    3:41 PM  Depression screen PHQ 2/9  Decreased Interest 0 0 0  Down, Depressed, Hopeless 0 0 0  PHQ - 2 Score 0 0 0  Altered sleeping 0 0 0  Tired, decreased energy 0 0 0  Change in appetite 0 0 0  Feeling bad or failure about yourself  0 0 0  Trouble concentrating 0 0 0  Moving slowly or fidgety/restless 0 0 0  Suicidal thoughts 0  0 0  PHQ-9 Score 0 0 0  Difficult doing work/chores Not difficult at all Not difficult at all Not difficult at all    BP Readings from Last 3 Encounters:  08/22/23 (!) 140/88  05/13/23 122/80  02/20/23 126/80    Physical Exam Vitals and nursing note reviewed.  Constitutional:      General: She is not in acute distress.    Appearance: She is not diaphoretic.  HENT:     Head: Normocephalic and atraumatic.     Right Ear: Tympanic membrane and external ear normal.     Left Ear: Tympanic membrane and external ear normal.     Nose: Nose normal. No congestion or rhinorrhea.     Mouth/Throat:     Mouth: Mucous membranes are moist.  Eyes:     General:        Right eye: No discharge.        Left eye: No discharge.     Conjunctiva/sclera: Conjunctivae normal.     Pupils: Pupils are equal, round, and reactive to light.  Neck:     Thyroid: No thyromegaly.     Vascular: No JVD.  Cardiovascular:     Rate and Rhythm: Normal rate and regular rhythm.     Heart sounds: Normal heart sounds. No murmur heard.    No friction rub. No gallop.  Pulmonary:     Effort: Pulmonary effort is normal. No respiratory distress.     Breath sounds: Normal breath sounds. No stridor. No wheezing, rhonchi or rales.  Abdominal:     General: Bowel sounds are normal.     Palpations: Abdomen is soft. There is no mass.     Tenderness: There is no abdominal tenderness. There is no  guarding or rebound.  Musculoskeletal:     Cervical back: Normal range of motion and neck supple.  Lymphadenopathy:     Cervical: No cervical adenopathy.  Skin:    General: Skin is warm.  Neurological:     Mental Status: She is alert.     Deep Tendon Reflexes: Reflexes are normal and symmetric.     Wt Readings from Last 3 Encounters:  08/22/23 204 lb (92.5 kg)  05/13/23 212 lb (96.2 kg)  02/20/23 211 lb (95.7 kg)    BP (!) 140/88   Pulse 71   Ht 5\' 3"  (1.6 m)   Wt 204 lb (92.5 kg)   LMP 04/27/2020 Comment: Supracervical   SpO2 97%   BMI 36.14 kg/m   Assessment and Plan:  1. Essential hypertension (Primary) Chronic.  Uncontrolled.  Stable.  Asymptomatic.  Tolerating medications well.  Blood pressure 140/88 and was repeated at 142/88.  Will increase losartan to 100 mg once a day and will recheck patient in 6 weeks.  In the meantime have cautioned her with sodium intake. - losartan (COZAAR) 100 MG tablet; Take 1 tablet (100 mg total) by mouth daily.  Dispense: 60 tablet; Refill: 0    Elizabeth Sauer, MD

## 2023-09-22 DIAGNOSIS — I10 Essential (primary) hypertension: Secondary | ICD-10-CM | POA: Diagnosis not present

## 2023-09-22 DIAGNOSIS — R809 Proteinuria, unspecified: Secondary | ICD-10-CM | POA: Diagnosis not present

## 2023-09-22 DIAGNOSIS — N182 Chronic kidney disease, stage 2 (mild): Secondary | ICD-10-CM | POA: Diagnosis not present

## 2023-10-06 ENCOUNTER — Encounter: Payer: Self-pay | Admitting: Family Medicine

## 2023-10-06 ENCOUNTER — Ambulatory Visit (INDEPENDENT_AMBULATORY_CARE_PROVIDER_SITE_OTHER): Payer: 59 | Admitting: Family Medicine

## 2023-10-06 VITALS — BP 128/82 | HR 61 | Resp 16 | Ht 63.0 in | Wt 201.0 lb

## 2023-10-06 DIAGNOSIS — R053 Chronic cough: Secondary | ICD-10-CM | POA: Diagnosis not present

## 2023-10-06 DIAGNOSIS — I10 Essential (primary) hypertension: Secondary | ICD-10-CM

## 2023-10-06 DIAGNOSIS — U099 Post covid-19 condition, unspecified: Secondary | ICD-10-CM | POA: Diagnosis not present

## 2023-10-06 DIAGNOSIS — J301 Allergic rhinitis due to pollen: Secondary | ICD-10-CM | POA: Diagnosis not present

## 2023-10-06 MED ORDER — MONTELUKAST SODIUM 10 MG PO TABS
ORAL_TABLET | ORAL | 1 refills | Status: AC
Start: 2023-10-06 — End: ?

## 2023-10-06 MED ORDER — METOPROLOL SUCCINATE ER 100 MG PO TB24: ORAL_TABLET | ORAL | 1 refills | Status: AC

## 2023-10-06 MED ORDER — CETIRIZINE HCL 10 MG PO TABS: 10.0000 mg | ORAL_TABLET | Freq: Every day | ORAL | 3 refills | Status: AC

## 2023-10-06 MED ORDER — HYDROCHLOROTHIAZIDE 25 MG PO TABS: 25.0000 mg | ORAL_TABLET | Freq: Every day | ORAL | 1 refills | Status: AC

## 2023-10-06 MED ORDER — LOSARTAN POTASSIUM 100 MG PO TABS: 100.0000 mg | ORAL_TABLET | Freq: Every day | ORAL | 1 refills | Status: AC

## 2023-10-06 NOTE — Progress Notes (Signed)
 Date:  10/06/2023   Name:  Jaclyn Fields   DOB:  [birth year]   MRN:  [id number]   Chief Complaint: Hypertension  Hypertension This is a chronic problem. The current episode started more than 1 year ago. The problem has been gradually improving since onset. The problem is controlled. Pertinent negatives include no anxiety, blurred vision, chest pain, headaches, malaise/fatigue, neck pain, orthopnea, palpitations, peripheral edema, PND, shortness of breath or sweats. There are no associated agents to hypertension. Risk factors for coronary artery disease include dyslipidemia.    Lab Results  Component Value Date   NA 138 06/26/2022   K 3.8 06/26/2022   CO2 27 06/26/2022   GLUCOSE 110 (H) 06/26/2022   BUN 22 06/26/2022   CREATININE 0.98 06/26/2022   CALCIUM 9.7 06/26/2022   EGFR 70 06/26/2022   GFRNONAA >60 04/27/2020   Lab Results  Component Value Date   CHOL 198 02/07/2022   HDL 54 02/07/2022   LDLCALC 127 (H) 02/07/2022   TRIG 96 02/07/2022   CHOLHDL 3.2 04/12/2016   Lab Results  Component Value Date   TSH 1.73 03/03/2020   No results found for: "HGBA1C" Lab Results  Component Value Date   WBC 11.9 (H) 07/24/2020   HGB 11.6 (L) 07/24/2020   HCT 36.5 07/24/2020   MCV 85.9 07/24/2020   PLT 494 (H) 07/24/2020   Lab Results  Component Value Date   ALT 9 02/07/2022   AST 10 02/07/2022   ALKPHOS 111 02/07/2022   BILITOT 0.3 02/07/2022   Lab Results  Component Value Date   VD25OH 20 (L) 01/12/2019     Review of Systems  Constitutional:  Negative for chills, fever and malaise/fatigue.  HENT:  Negative for trouble swallowing.   Eyes:  Negative for blurred vision and visual disturbance.  Respiratory:  Negative for cough, choking, chest tightness, shortness of breath and wheezing.   Cardiovascular:  Negative for chest pain, palpitations, orthopnea, leg swelling and PND.  Gastrointestinal:  Negative for abdominal pain and blood in stool.  Genitourinary:   Negative for difficulty urinating, frequency and hematuria.  Musculoskeletal:  Negative for neck pain.  Neurological:  Negative for headaches.    Patient Active Problem List   Diagnosis Date Noted   Postoperative state 05/08/2020   Post-operative state 05/08/2020   Essential hypertension 01/02/2018   Transfusion history 08/12/2012   Postpartum hemorrhage 09/05/2011   PP care - s/p 1C/S 3/1 (breech, [facility]) 08/24/2011   Maternal iron deficiency anemia 08/24/2011   Benign essential hypertension complicating pregnancy, childbirth, and the puerperium 08/23/2011    Allergies  Allergen Reactions   Sulfa Antibiotics Swelling and Rash    SWELLING TO FINGERS    Past Surgical History:  Procedure Laterality Date   AUGMENTATION MAMMAPLASTY Bilateral 2005   BARTHOLIN CYST MARSUPIALIZATION Left 05/08/2020   Procedure: BARTHOLIN CYST MARSUPIALIZATION;  Surgeon: Genia Del, MD;  Location: [facility];  Service: Gynecology;  Laterality: Left;   CESAREAN SECTION     COLONOSCOPY  2018   DILATION AND EVACUATION  09/05/2011   Procedure: DILATATION AND EVACUATION;  Surgeon: Robley Fries, MD;  Location: [facility];  Service: Gynecology;  Laterality: Bilateral;   ROBOTIC ASSISTED SUPRACERVICAL HYSTERECTOMY WITH BILATERAL SALPINGO OOPHERECTOMY Bilateral 05/08/2020   Procedure: XI ROBOTIC ASSISTED SUPRACERVICAL HYSTERECTOMY WITH BILATERAL SALPINGECTOMY;  Surgeon: Genia Del, MD;  Location: [facility];  Service: Gynecology;  Laterality: Bilateral;   TUBAL LIGATION     WISDOM TOOTH EXTRACTION  Social History   Tobacco Use   Smoking status: Never   Smokeless tobacco: Never  Vaping Use   Vaping status: Never Used  Substance Use Topics   Alcohol use: Yes    Comment: occasionally   Drug use: No     Medication list has been reviewed and updated.  Current Meds  Medication Sig   albuterol (VENTOLIN HFA) 108 (90 Base) MCG/ACT inhaler Inhale 2 puffs  into the lungs every 6 (six) hours as needed for wheezing or shortness of breath.   Ascorbic Acid (VITAMIN C GUMMIE PO) Take 1 tablet by mouth daily.   Biotin w/ Vitamins C & E (HAIR/SKIN/NAILS PO) Take 2 tablets by mouth daily.   cetirizine (ZYRTEC) 10 MG tablet Take 1 tablet (10 mg total) by mouth daily.   cholecalciferol (VITAMIN D3) 25 MCG (1000 UNIT) tablet Take 1,000 Units by mouth daily.   Difluprednate 0.05 % EMUL Place 1 drop into the left eye daily.   hydrochlorothiazide (HYDRODIURIL) 25 MG tablet Take 1 tablet (25 mg total) by mouth daily.   losartan (COZAAR) 100 MG tablet Take 1 tablet (100 mg total) by mouth daily.   meloxicam (MOBIC) 15 MG tablet Take 1 tablet (15 mg total) by mouth daily.   metoprolol succinate (TOPROL-XL) 100 MG 24 hr tablet TAKE 1 TABLET BY MOUTH DAILY. TAKE WITH OR IMMEDIATELY FOLLOWING A MEAL.   montelukast (SINGULAIR) 10 MG tablet TAKE 1 TABLET BY MOUTH EVERYDAY AT BEDTIME   Multiple Vitamins-Minerals (MULTIVITAMIN GUMMIES WOMENS PO) Take 2 tablets by mouth daily.   mupirocin nasal ointment (BACTROBAN) 2 % Place 1 application into the nose 2 (two) times daily. Use one-half of tube in each nostril twice daily for five (5) days. After application, press sides of nose together and gently massage.   Omega-3 Fatty Acids (FISH OIL PO) Take 1 capsule by mouth daily.   OZEMPIC, 0.25 OR 0.5 MG/DOSE, 2 MG/3ML SOPN    triamcinolone (NASACORT) 55 MCG/ACT AERO nasal inhaler Place 2 sprays into the nose daily.   triamcinolone cream (KENALOG) 0.1 % Apply 1 Application topically 2 (two) times daily.   valACYclovir (VALTREX) 1000 MG tablet Take 1 tablet (1,000 mg total) by mouth as needed (for flare ups).       08/22/2023    1:33 PM 10/22/2022    3:22 PM 09/23/2022    3:41 PM 08/08/2022    8:35 AM  GAD 7 : Generalized Anxiety Score  Nervous, Anxious, on Edge 0 0 0 0  Control/stop worrying 0 0 0 0  Worry too much - different things 0 0 0 0  Trouble relaxing 0 0 0 0   Restless 0 0 0 0  Easily annoyed or irritable 0 0 0 0  Afraid - awful might happen 0 0 0 0  Total GAD 7 Score 0 0 0 0  Anxiety Difficulty Not difficult at all Not difficult at all Not difficult at all Not difficult at all       08/22/2023    1:33 PM 10/22/2022    3:22 PM 09/23/2022    3:41 PM  Depression screen PHQ 2/9  Decreased Interest 0 0 0  Down, Depressed, Hopeless 0 0 0  PHQ - 2 Score 0 0 0  Altered sleeping 0 0 0  Tired, decreased energy 0 0 0  Change in appetite 0 0 0  Feeling bad or failure about yourself  0 0 0  Trouble concentrating 0 0 0  Moving slowly or fidgety/restless  0 0 0  Suicidal thoughts 0 0 0  PHQ-9 Score 0 0 0  Difficult doing work/chores Not difficult at all Not difficult at all Not difficult at all    BP Readings from Last 3 Encounters:  10/06/23 128/82  08/22/23 (!) 142/88  05/13/23 122/80    Physical Exam Vitals and nursing note reviewed.  Constitutional:      General: She is not in acute distress.    Appearance: She is not diaphoretic.  HENT:     Head: Normocephalic and atraumatic.     Right Ear: Tympanic membrane and external ear normal.     Left Ear: Tympanic membrane and external ear normal.     Nose: Nose normal.     Mouth/Throat:     Mouth: Mucous membranes are moist.  Eyes:     General:        Right eye: No discharge.        Left eye: No discharge.     Conjunctiva/sclera: Conjunctivae normal.     Pupils: Pupils are equal, round, and reactive to light.  Neck:     Thyroid: No thyromegaly.     Vascular: No JVD.  Cardiovascular:     Rate and Rhythm: Normal rate and regular rhythm.     Heart sounds: Normal heart sounds. No murmur heard.    No friction rub. No gallop.  Pulmonary:     Effort: Pulmonary effort is normal.     Breath sounds: Normal breath sounds. No wheezing, rhonchi or rales.  Abdominal:     General: Bowel sounds are normal.     Palpations: Abdomen is soft. There is no mass.     Tenderness: There is no abdominal  tenderness. There is no guarding.  Musculoskeletal:        General: Normal range of motion.     Cervical back: Normal range of motion and neck supple.  Lymphadenopathy:     Cervical: No cervical adenopathy.  Skin:    General: Skin is warm and dry.  Neurological:     Mental Status: She is alert.     Wt Readings from Last 3 Encounters:  10/06/23 201 lb (91.2 kg)  08/22/23 204 lb (92.5 kg)  05/13/23 212 lb (96.2 kg)    BP 128/82   Pulse 61   Resp 16   Ht 5\' 3"  (1.6 m)   Wt 201 lb (91.2 kg)   LMP 04/27/2020 Comment: Supracervical   SpO2 91%   BMI 35.61 kg/m   Assessment and Plan: 1. Essential hypertension (Primary) Chronic.  Controlled.  Stable.  Asymptomatic.  Tolerating current medication well.  Blood pressure today is 128/82.  Continue hydrochlorothiazide 25 mg once a day, losartan 100 mg once a day and metoprolol XL 100 mg daily.  Review of previous renal function panel acceptable will recheck patient in 6 months. - hydrochlorothiazide (HYDRODIURIL) 25 MG tablet; Take 1 tablet (25 mg total) by mouth daily.  Dispense: 90 tablet; Refill: 1 - losartan (COZAAR) 100 MG tablet; Take 1 tablet (100 mg total) by mouth daily.  Dispense: 60 tablet; Refill: 1 - metoprolol succinate (TOPROL-XL) 100 MG 24 hr tablet; TAKE 1 TABLET BY MOUTH DAILY. TAKE WITH OR IMMEDIATELY FOLLOWING A MEAL.  Dispense: 90 tablet; Refill: 1  2. Seasonal allergic rhinitis due to pollen Chronic.  Controlled.  Stable.  Patient's been having increased symptomatology with the pollen counts escalating.  We will refill Zyrtec 10 mg once a day along with her Singulair 10 mg once  a day at bedtime. - cetirizine (ZYRTEC) 10 MG tablet; Take 1 tablet (10 mg total) by mouth daily.  Dispense: 90 tablet; Refill: 3 - montelukast (SINGULAIR) 10 MG tablet; TAKE 1 TABLET BY MOUTH EVERYDAY AT BEDTIME  Dispense: 90 tablet; Refill: 1  3. Post-COVID chronic cough Chronic.  Controlled.  Stable.  There is probably some degree of  reactivity of the lung as well for which Singulair 10 mg has been prescribed at bedtime. - montelukast (SINGULAIR) 10 MG tablet; TAKE 1 TABLET BY MOUTH EVERYDAY AT BEDTIME  Dispense: 90 tablet; Refill: 1     Alayne Allis, MD

## 2023-10-09 DIAGNOSIS — M25562 Pain in left knee: Secondary | ICD-10-CM | POA: Diagnosis not present

## 2023-10-17 DIAGNOSIS — J0140 Acute pansinusitis, unspecified: Secondary | ICD-10-CM | POA: Diagnosis not present

## 2023-10-17 DIAGNOSIS — J029 Acute pharyngitis, unspecified: Secondary | ICD-10-CM | POA: Diagnosis not present

## 2023-10-17 DIAGNOSIS — R051 Acute cough: Secondary | ICD-10-CM | POA: Diagnosis not present

## 2023-10-17 DIAGNOSIS — I10 Essential (primary) hypertension: Secondary | ICD-10-CM | POA: Diagnosis not present

## 2024-04-02 DIAGNOSIS — E538 Deficiency of other specified B group vitamins: Secondary | ICD-10-CM | POA: Diagnosis not present

## 2024-04-02 DIAGNOSIS — M542 Cervicalgia: Secondary | ICD-10-CM | POA: Diagnosis not present

## 2024-04-02 DIAGNOSIS — N182 Chronic kidney disease, stage 2 (mild): Secondary | ICD-10-CM | POA: Diagnosis not present

## 2024-04-02 DIAGNOSIS — Z1331 Encounter for screening for depression: Secondary | ICD-10-CM | POA: Diagnosis not present

## 2024-04-02 DIAGNOSIS — Z1321 Encounter for screening for nutritional disorder: Secondary | ICD-10-CM | POA: Diagnosis not present

## 2024-04-02 DIAGNOSIS — Z131 Encounter for screening for diabetes mellitus: Secondary | ICD-10-CM | POA: Diagnosis not present

## 2024-04-02 DIAGNOSIS — Z130 Encounter for screening for diseases of the blood and blood-forming organs and certain disorders involving the immune mechanism: Secondary | ICD-10-CM | POA: Diagnosis not present

## 2024-04-02 DIAGNOSIS — I10 Essential (primary) hypertension: Secondary | ICD-10-CM | POA: Diagnosis not present

## 2024-04-02 DIAGNOSIS — Z13220 Encounter for screening for lipoid disorders: Secondary | ICD-10-CM | POA: Diagnosis not present

## 2024-05-29 DIAGNOSIS — J0100 Acute maxillary sinusitis, unspecified: Secondary | ICD-10-CM | POA: Diagnosis not present

## 2024-05-29 DIAGNOSIS — J45901 Unspecified asthma with (acute) exacerbation: Secondary | ICD-10-CM | POA: Diagnosis not present
